# Patient Record
Sex: Male | Born: 1935 | Race: White | Hispanic: No | Smoking: Never smoker
Health system: Southern US, Community
[De-identification: ages and names within clinical notes are randomized; demographics above are authoritative.]

## PROBLEM LIST (undated history)

## (undated) DIAGNOSIS — E7800 Pure hypercholesterolemia, unspecified: Secondary | ICD-10-CM

## (undated) DIAGNOSIS — I2510 Atherosclerotic heart disease of native coronary artery without angina pectoris: Secondary | ICD-10-CM

## (undated) DIAGNOSIS — J189 Pneumonia, unspecified organism: Secondary | ICD-10-CM

## (undated) DIAGNOSIS — M109 Gout, unspecified: Secondary | ICD-10-CM

## (undated) DIAGNOSIS — K219 Gastro-esophageal reflux disease without esophagitis: Secondary | ICD-10-CM

## (undated) DIAGNOSIS — I10 Essential (primary) hypertension: Secondary | ICD-10-CM

## (undated) HISTORY — PX: COLON SURGERY: SHX602

## (undated) HISTORY — PX: LITHOTRIPSY: SUR834

## (undated) HISTORY — PX: ACHILLES TENDON REPAIR: SUR1153

## (undated) HISTORY — PX: CAROTID ARTERY ANGIOPLASTY: SHX1300

## (undated) HISTORY — PX: LEG SURGERY: SHX1003

---

## 2004-05-31 ENCOUNTER — Ambulatory Visit: Payer: Self-pay | Admitting: Internal Medicine

## 2004-05-31 IMAGING — CT CT STONE STUDY
1 of 2 series · 14 of 32 positions shown, 18 images · non-contrast
Comparison: none

REASON FOR EXAM: LT side pain   hx of stones   ATTN KUB
COMMENTS:

[Series 2: soft tissue · axial · 0.67mm/px · z∈[-566,-146]mm · 14 of 158 slices shown, 18 images]
[im 12/158  soft-tissue]
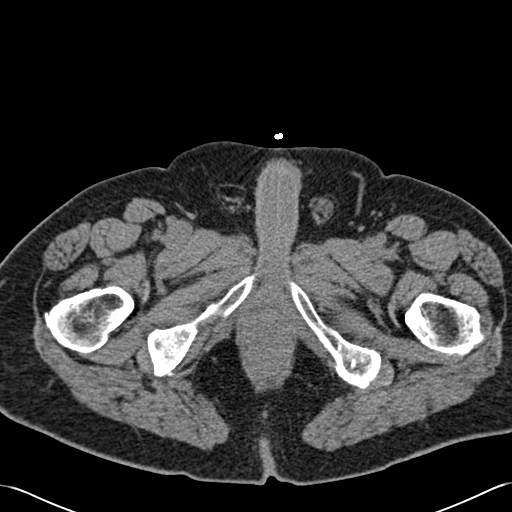
[im 12/158  bone]
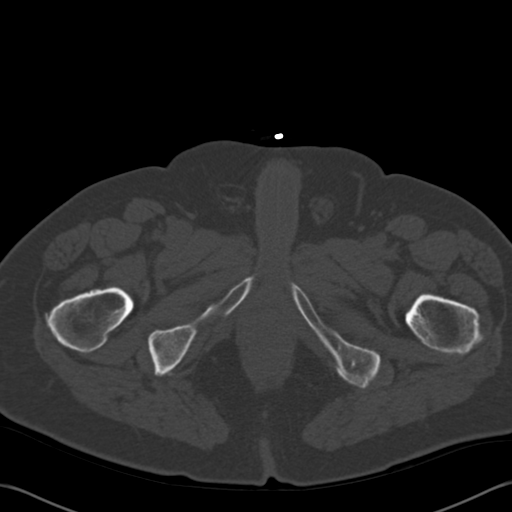
[im 23/158  soft-tissue]
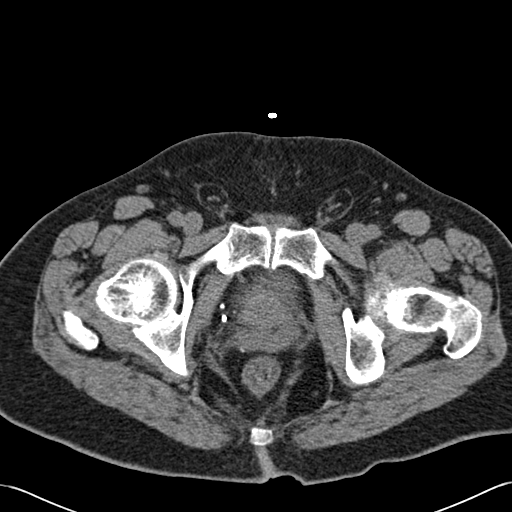
[im 34/158  soft-tissue]
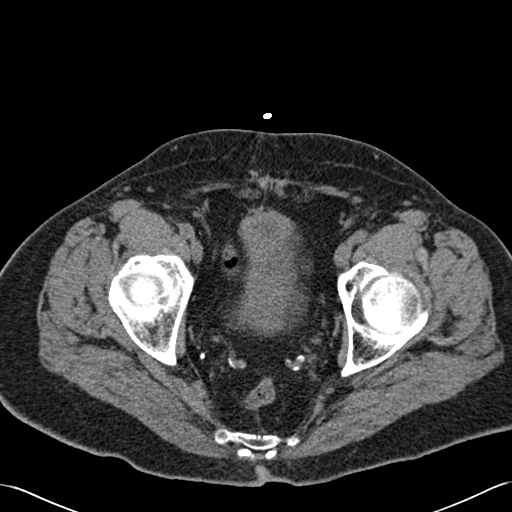
[im 45/158  soft-tissue]
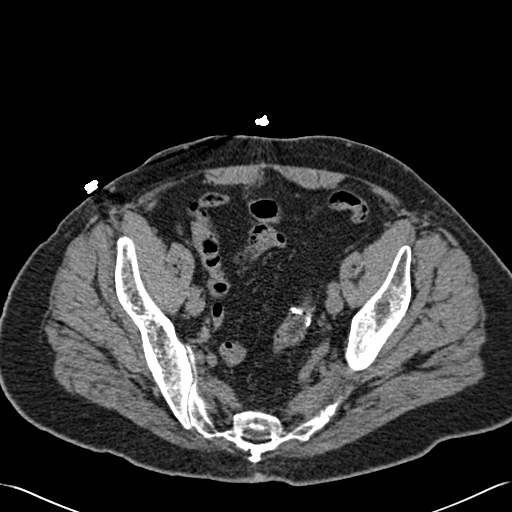
[im 62/158  soft-tissue]
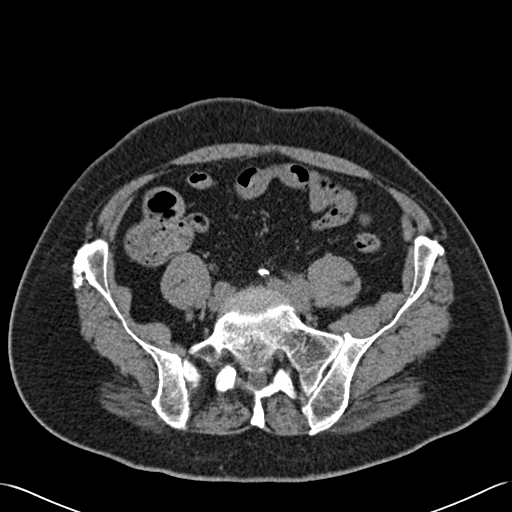
[im 73/158  soft-tissue]
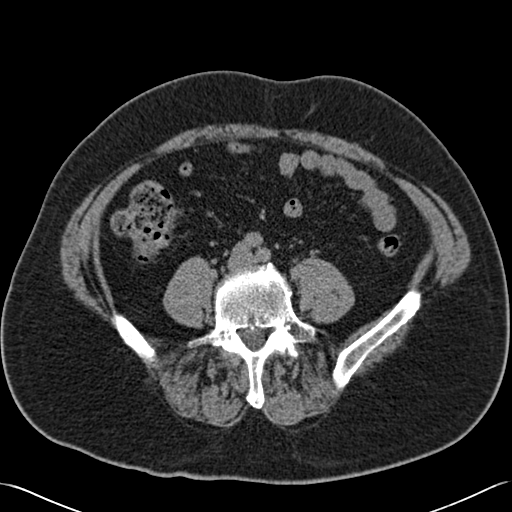
[im 85/158  soft-tissue]
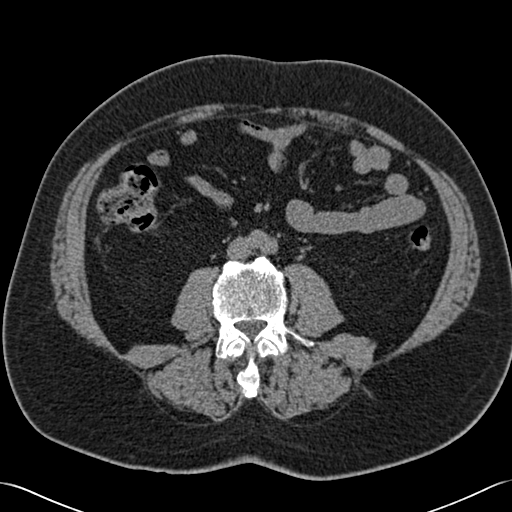
[im 96/158  soft-tissue]
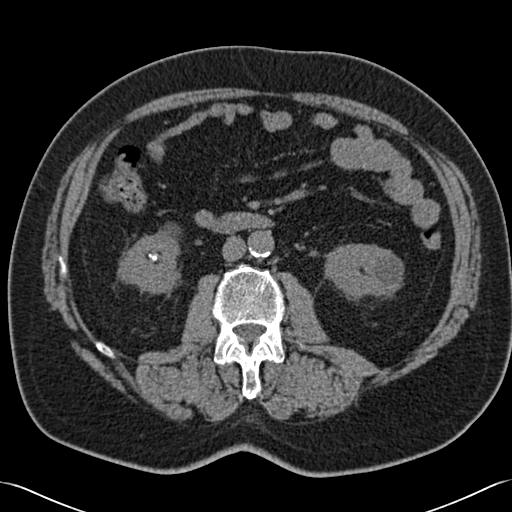
[im 113/158  soft-tissue]
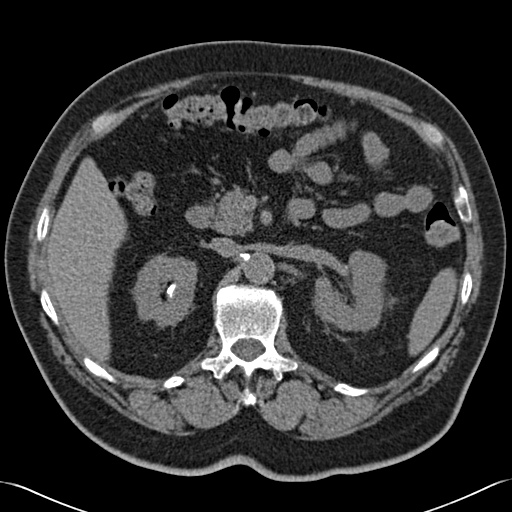
[im 113/158  bone]
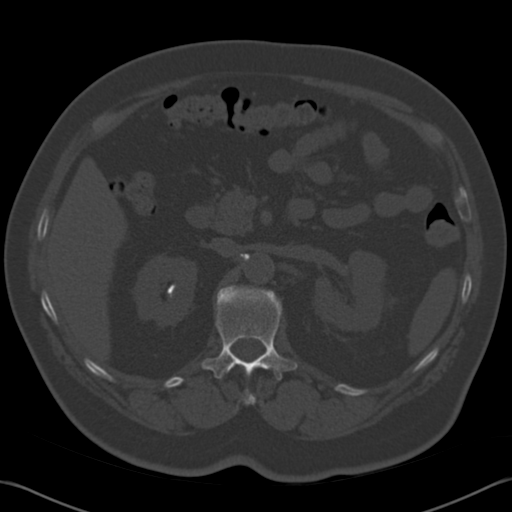
[im 124/158  soft-tissue]
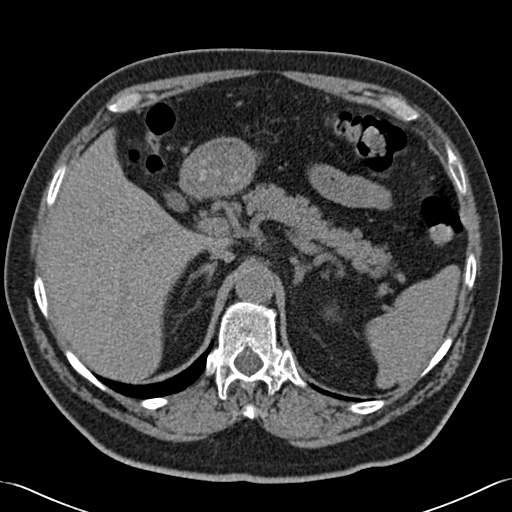
[im 135/158  soft-tissue]
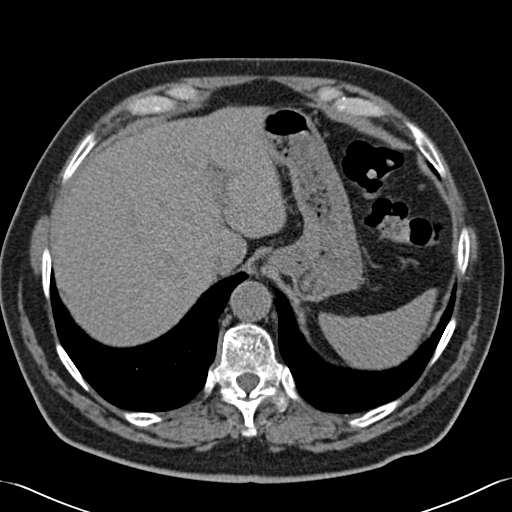
[im 135/158  lung]
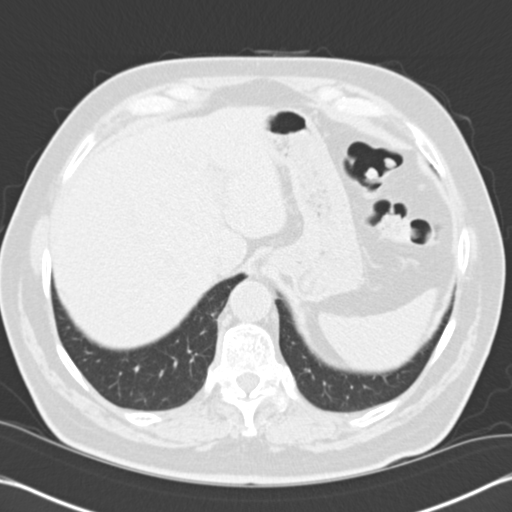
[im 141/158  lung]
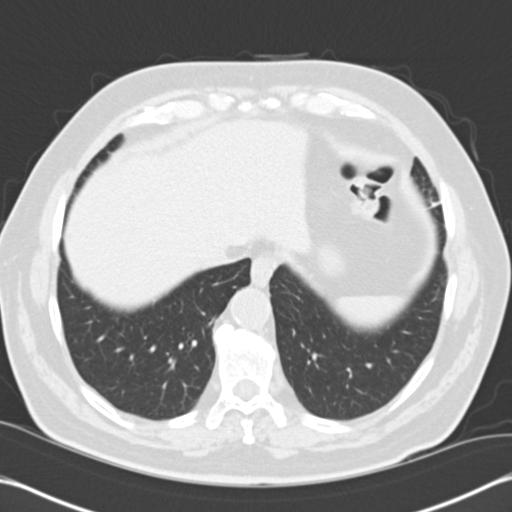
[im 146/158  soft-tissue]
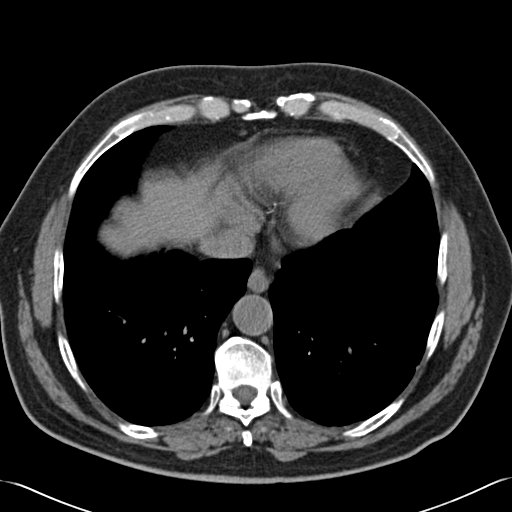
[im 146/158  lung]
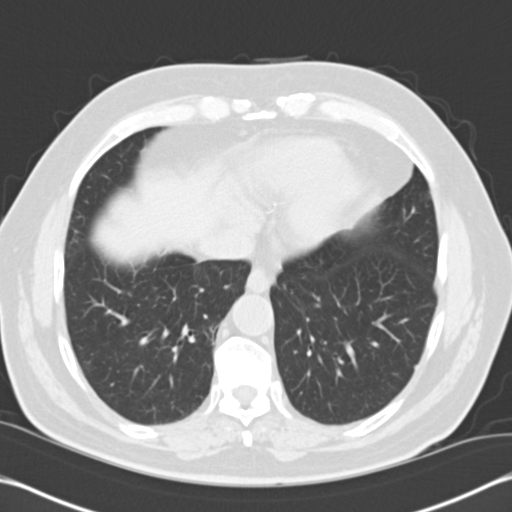
[im 152/158  lung]
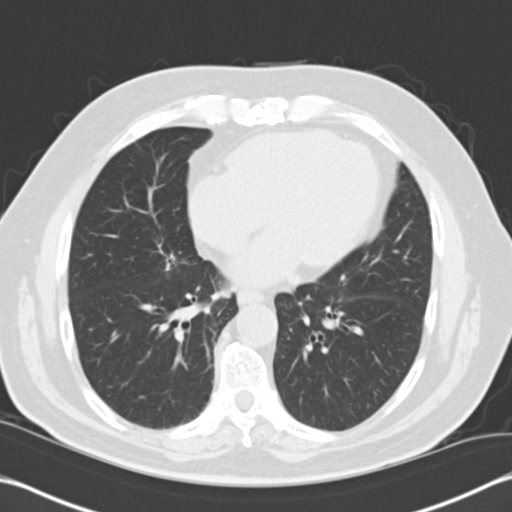

[14 of 32 positions shown; findings below may reference images not displayed]

PROCEDURE:     CT  - CT ABDOMEN /PELVIS WO (STONE)  - [DATE]  [DATE]

RESULT:     3 mm unenhanced cuts through the abdomen and pelvis were
performed.  The lung bases are clear. No pleural or pericardial effusions
are present. Cuts through the upper abdomen show the solid organs aside from
the kidneys to be free of abnormality.  Their evaluation is somewhat limited
due to lack of IV contrast.  Both kidneys show multiple calcifications in
the corticomedullary junctions measuring between 2 and 4 mm.  These do not
appear to be causing obstruction, as there is no evidence of hydronephrosis,
hydroureter or perinephric stranding.  There is subtle contour abnormality
in the mid portion of the LEFT kidney and in the lower pole of the RIGHT
kidney.  Hounsfield units for both measure in the teens that suggests fluid
density, but I would recommend further evaluation with ultrasound or
triphasic study of the abdomen to exclude an underlying sinister process.  A
prior ultrasound of [DATE] showed a LEFT renal cyst, but no other renal
lesions so I would again recommend further evaluation. No free
intraperitoneal fluid, air or adenopathy is identified. The bony structures
show extensive degenerative change without lytic or blastic bony lesion.
There is evidence of prior pelvic surgery.  The bladder distends normally
without evidence of filling defect or wall thickening. There are two
calcific densities the LEFT ureterovesical junction and may have recently
passed into the bladder.  They are likely only causing only partial
obstruction as no significant hydronephrosis or hydroureter is identified.
The prostate gland is mildly enlarged. There are calcified seminal vesicles.
 No perirectal mass or thickening is identified. No inguinal mass or
adenopathy is noted.
IMPRESSION: 1)Two separate calcific densities are seen at the LEFT ureterovesical
junction. They may have recently passed into the bladder.  Each of these
measures approximately 3 mm in size.  If they are causing obstruction it is
only likely partial obstruction as there is no evidence of hydronephrosis,
hydroureter or perinephric stranding on the LEFT side.

2)Contour abnormalities are noted in both kidneys.  A previous renal
ultrasound showed only a simple LEFT renal cyst.  Without contrast I cannot
evaluate these contour abnormalities adequately and would recommend either
another ultrasound or triphasic CT for further evaluation.

3)Bilateral non-obstructing nephrolithiasis is seen in the corticomedullary
junction of both kidneys with stones measuring between 2 and 4 mm in size.

4)No free intraperitoneal fluid, air or adenopathy.

5)The remaining solid organs of the abdomen are unremarkable.

6)Extensive degenerative bony changes without lytic or blastic bony lesion.

## 2004-06-07 ENCOUNTER — Ambulatory Visit: Payer: Self-pay | Admitting: Urology

## 2004-06-07 IMAGING — US US RENAL KIDNEY
1 series · 17 of 25 positions shown · non-contrast
Comparison: none

REASON FOR EXAM: Abnormal CT showing contour abnormalities in both kidneys,
bilateral kidney stones
COMMENTS:

[Series 1: us renal kidney · 17 of 59 slices shown]
[im 1/59]
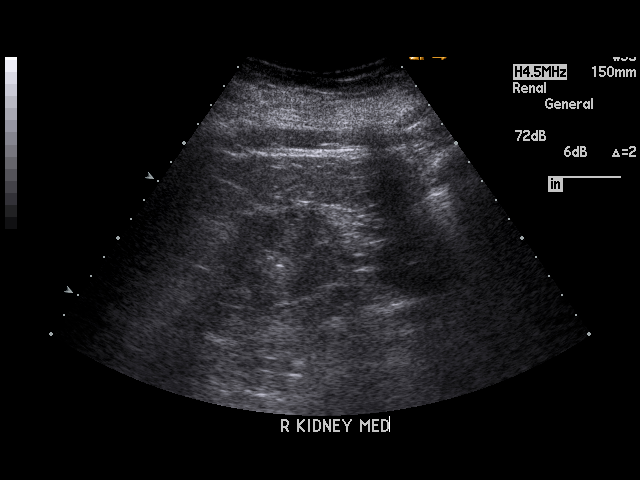
[im 5/59]
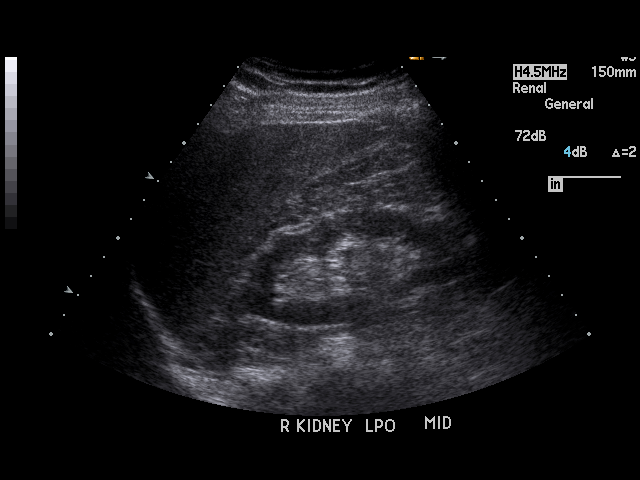
[im 8/59]
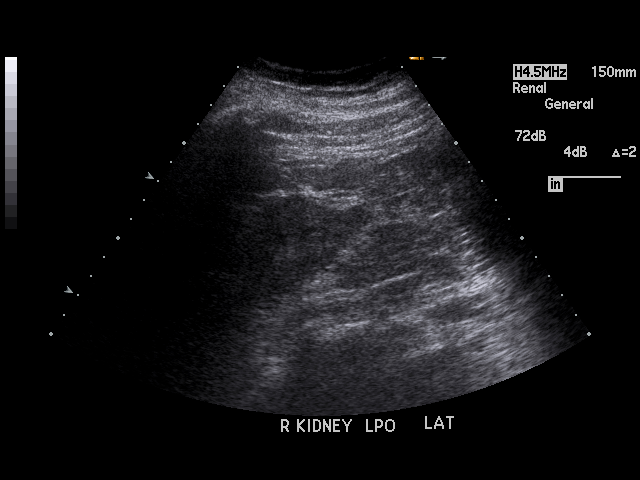
[im 13/59]
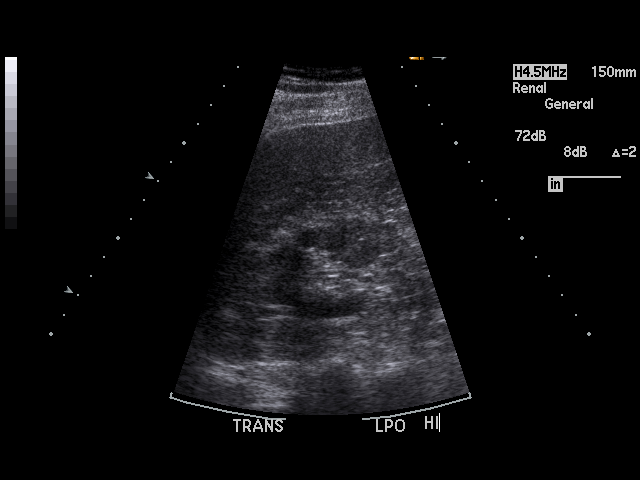
[im 15/59]
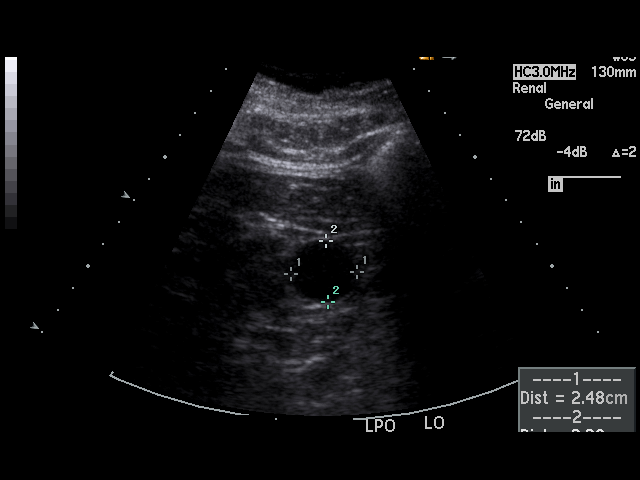
[im 20/59]
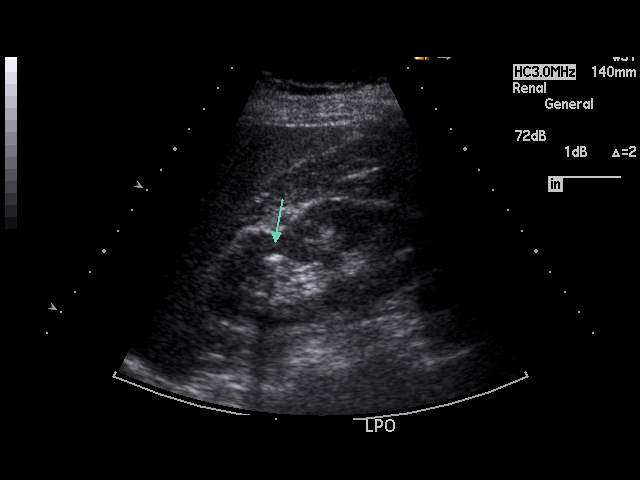
[im 22/59]
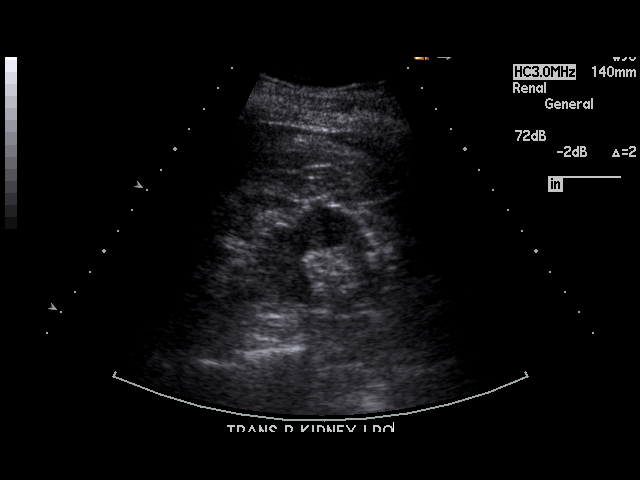
[im 27/59]
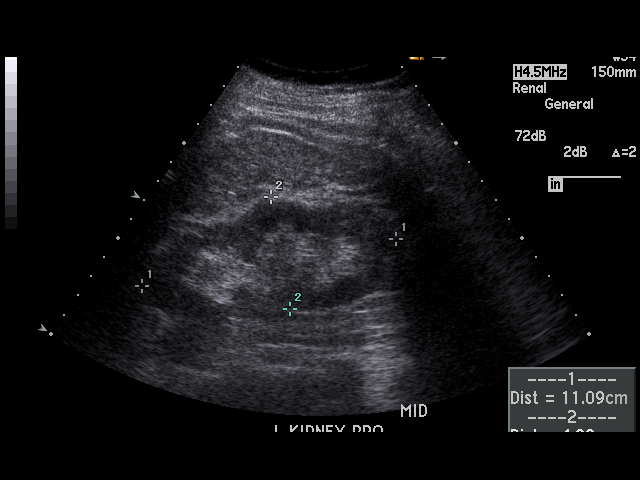
[im 30/59]
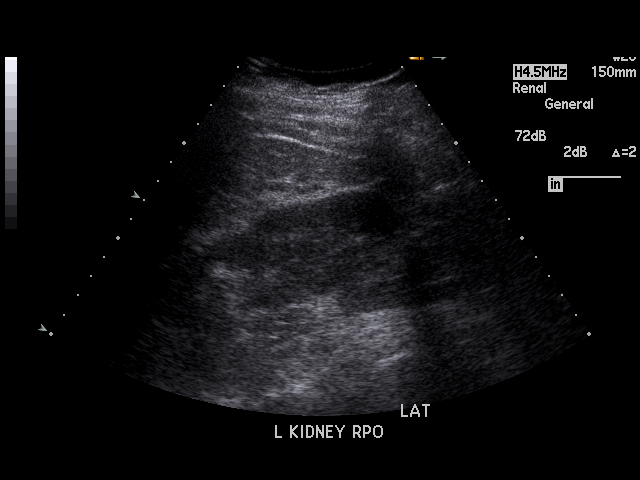
[im 32/59]
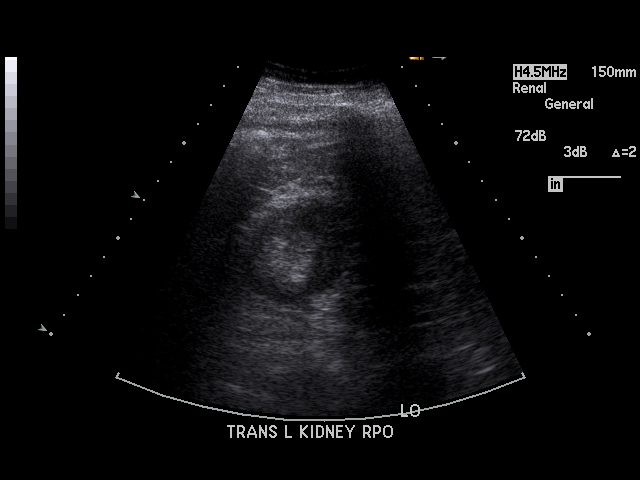
[im 37/59]
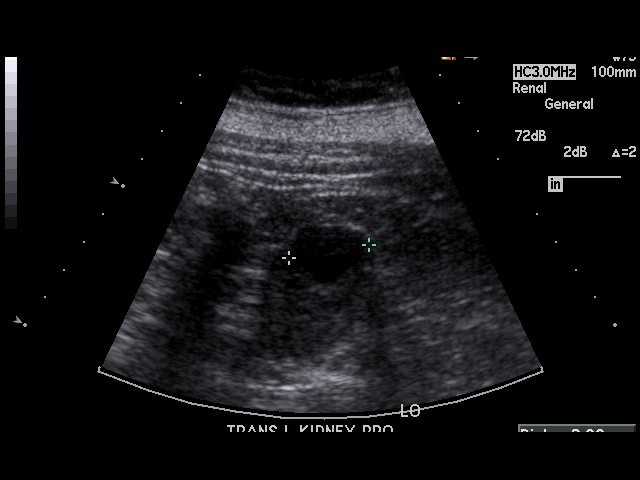
[im 39/59]
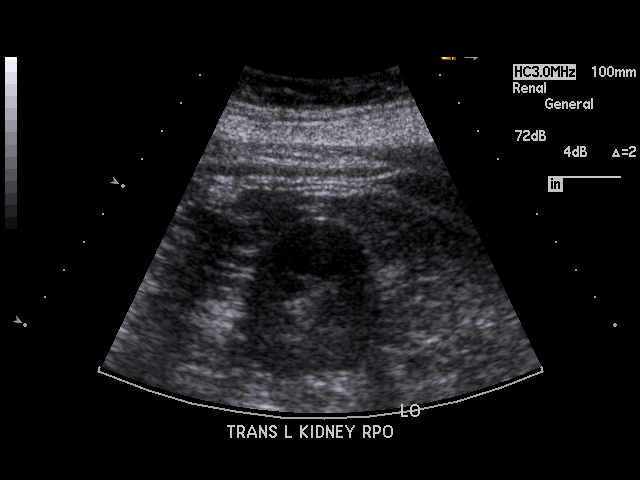
[im 44/59]
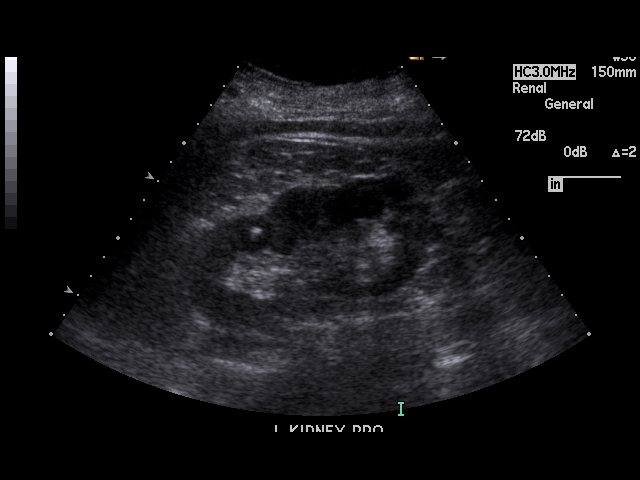
[im 46/59]
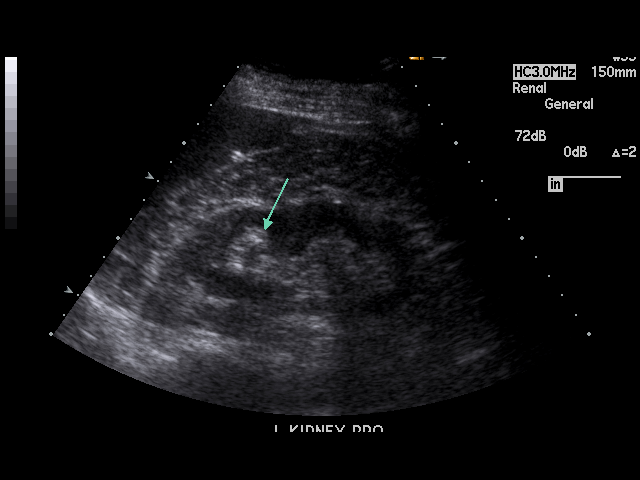
[im 51/59]
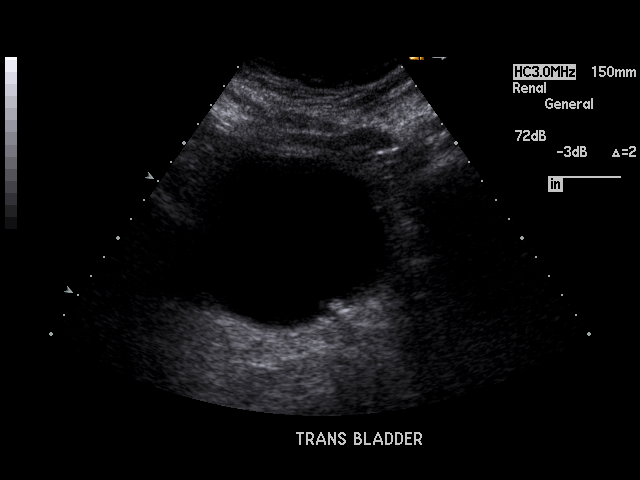
[im 54/59]
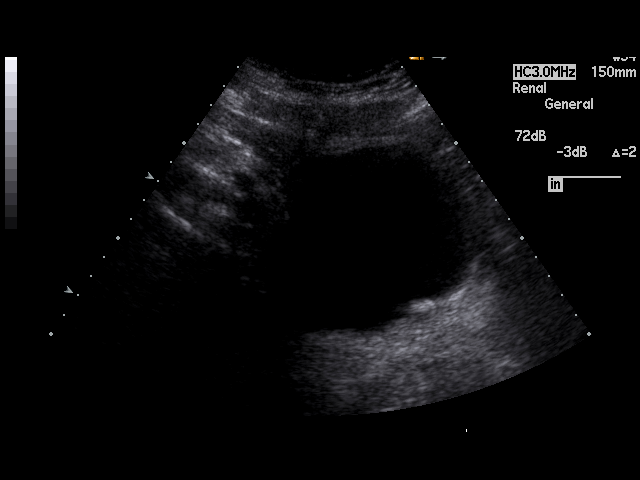
[im 59/59]
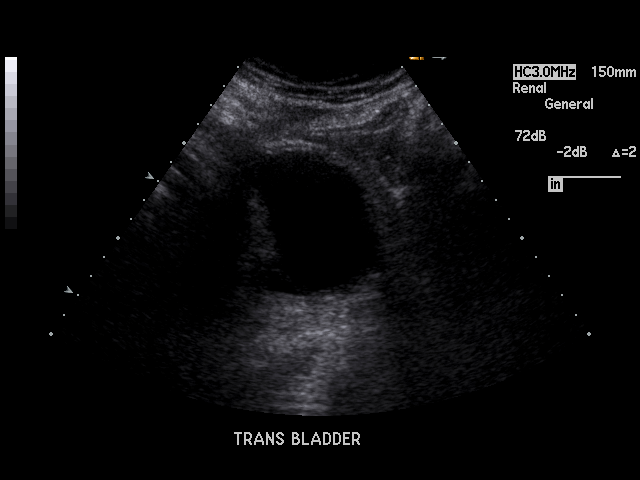

[17 of 25 positions shown; findings below may reference images not displayed]

PROCEDURE:     US  - US KIDNEY BILATERAL  - [DATE]  [DATE]

RESULT:     Ultrasound evaluation of the kidneys shows them to measure
cm in length. Both kidneys show a lobular contour and cortical thinning. The
cortical thinning is likely age consistent. The lobular contour suggests
persistent fetal lobation. No suspicious mass is identified on either
kidney. There is no hydronephrosis. Small corticomedullary junction
calcifications are noted measuring between 2.0 and 3.0 mm in both kidneys
but there is no obvious obstruction. There is a simple cyst of the lower
pole of the LEFT kidney measuring 2.4 x 1.7 cm. The bladder distends
normally but shows evidence of a small 2.0 to 3.0 mm calcification at the
LEFT UVJ. Because there is no hydronephrosis or hydroureter identified this
likely is only causing partial obstruction.
IMPRESSION: 1.     Bilateral non-obstructing nephrolithiasis.
2.     There is a 2.0 to 3.0 mm stone at the LEFT UVJ likely only causing
partial obstruction as there is no hydronephrosis or hydroureter.
3.     No suspicious focal lesion is identified with either kidney. There is
a lobular contour to both kidneys likely representing persistent fetal
lobation.
4.     Simple cyst, lower pole of LEFT kidney.
5.     Age consistent cortical thinning.

## 2005-01-18 ENCOUNTER — Ambulatory Visit: Payer: Self-pay | Admitting: Unknown Physician Specialty

## 2007-01-16 ENCOUNTER — Ambulatory Visit: Payer: Self-pay | Admitting: Urology

## 2007-01-16 IMAGING — CR DG ABDOMEN 1V
1 series · 2 of 2 positions shown · non-contrast
Comparison: none

REASON FOR EXAM: renal calculi-lithotripsy
COMMENTS:

[Series 1: view not recorded · 0.17mm/px · 2 of 2 slices shown]
[im 1/2]
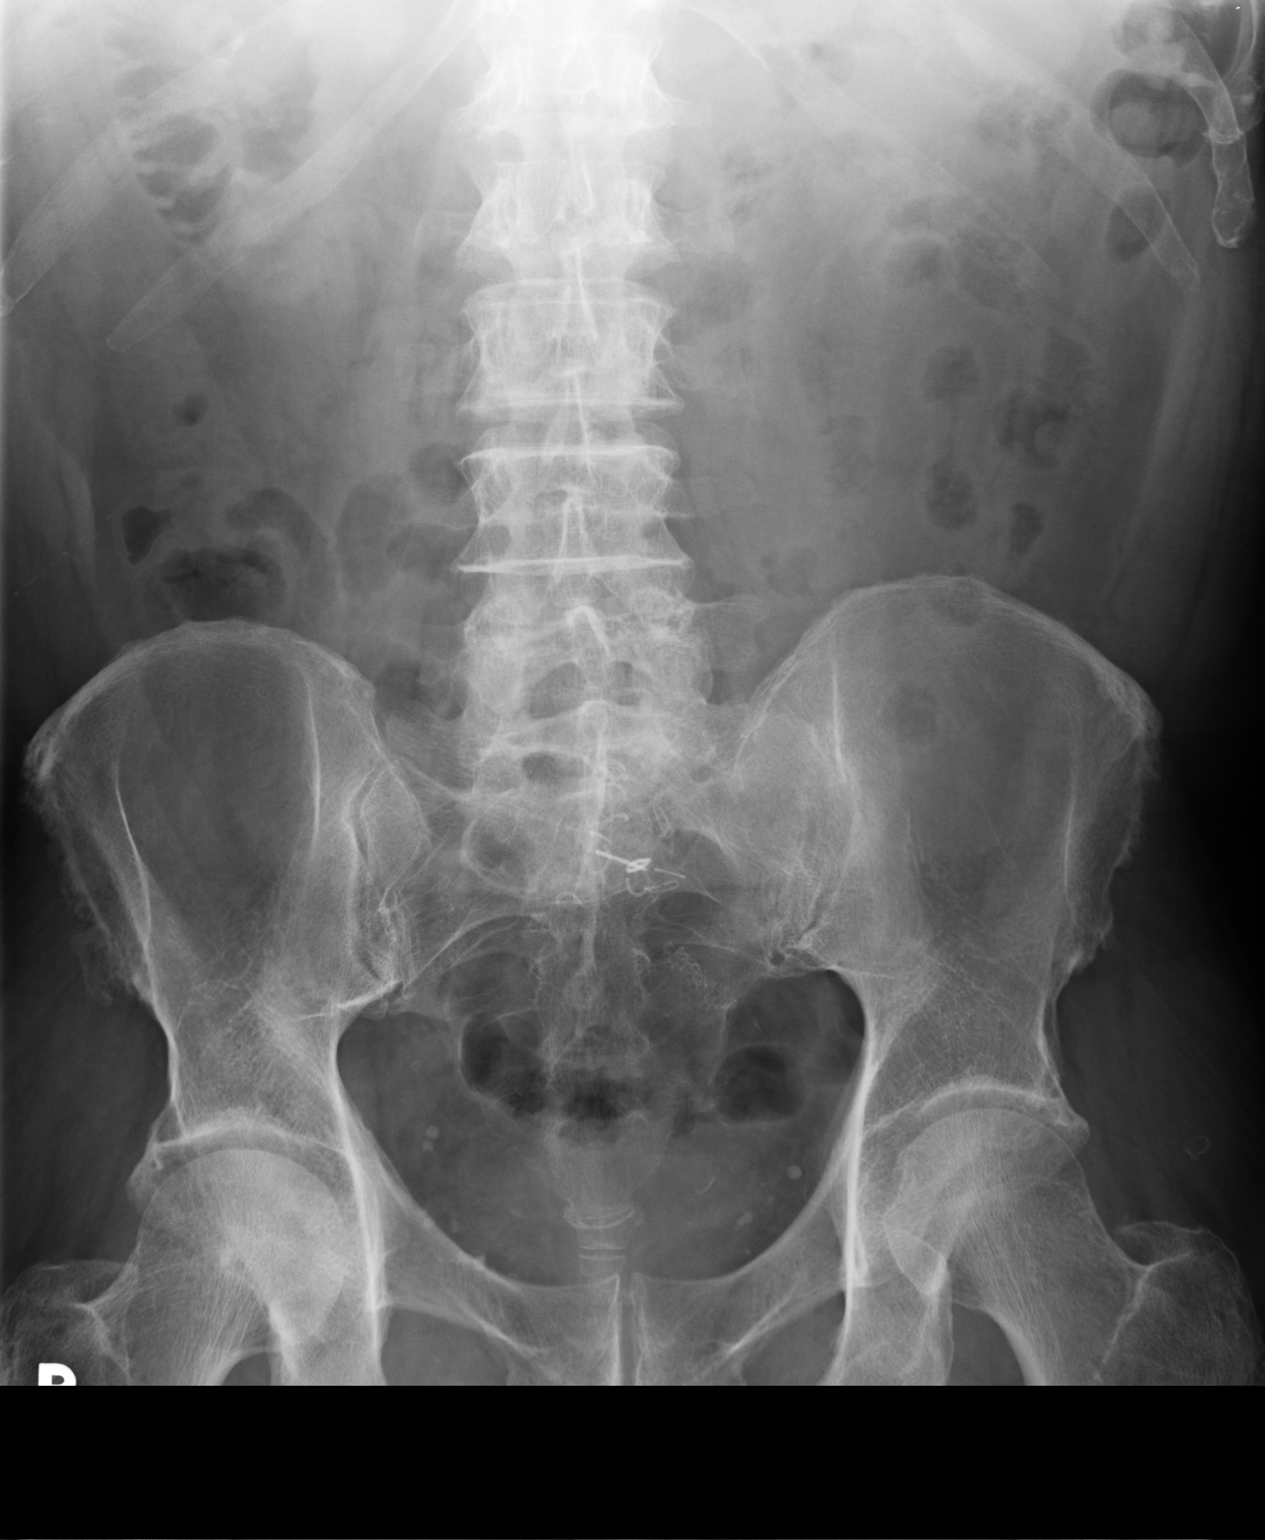
[im 2/2]
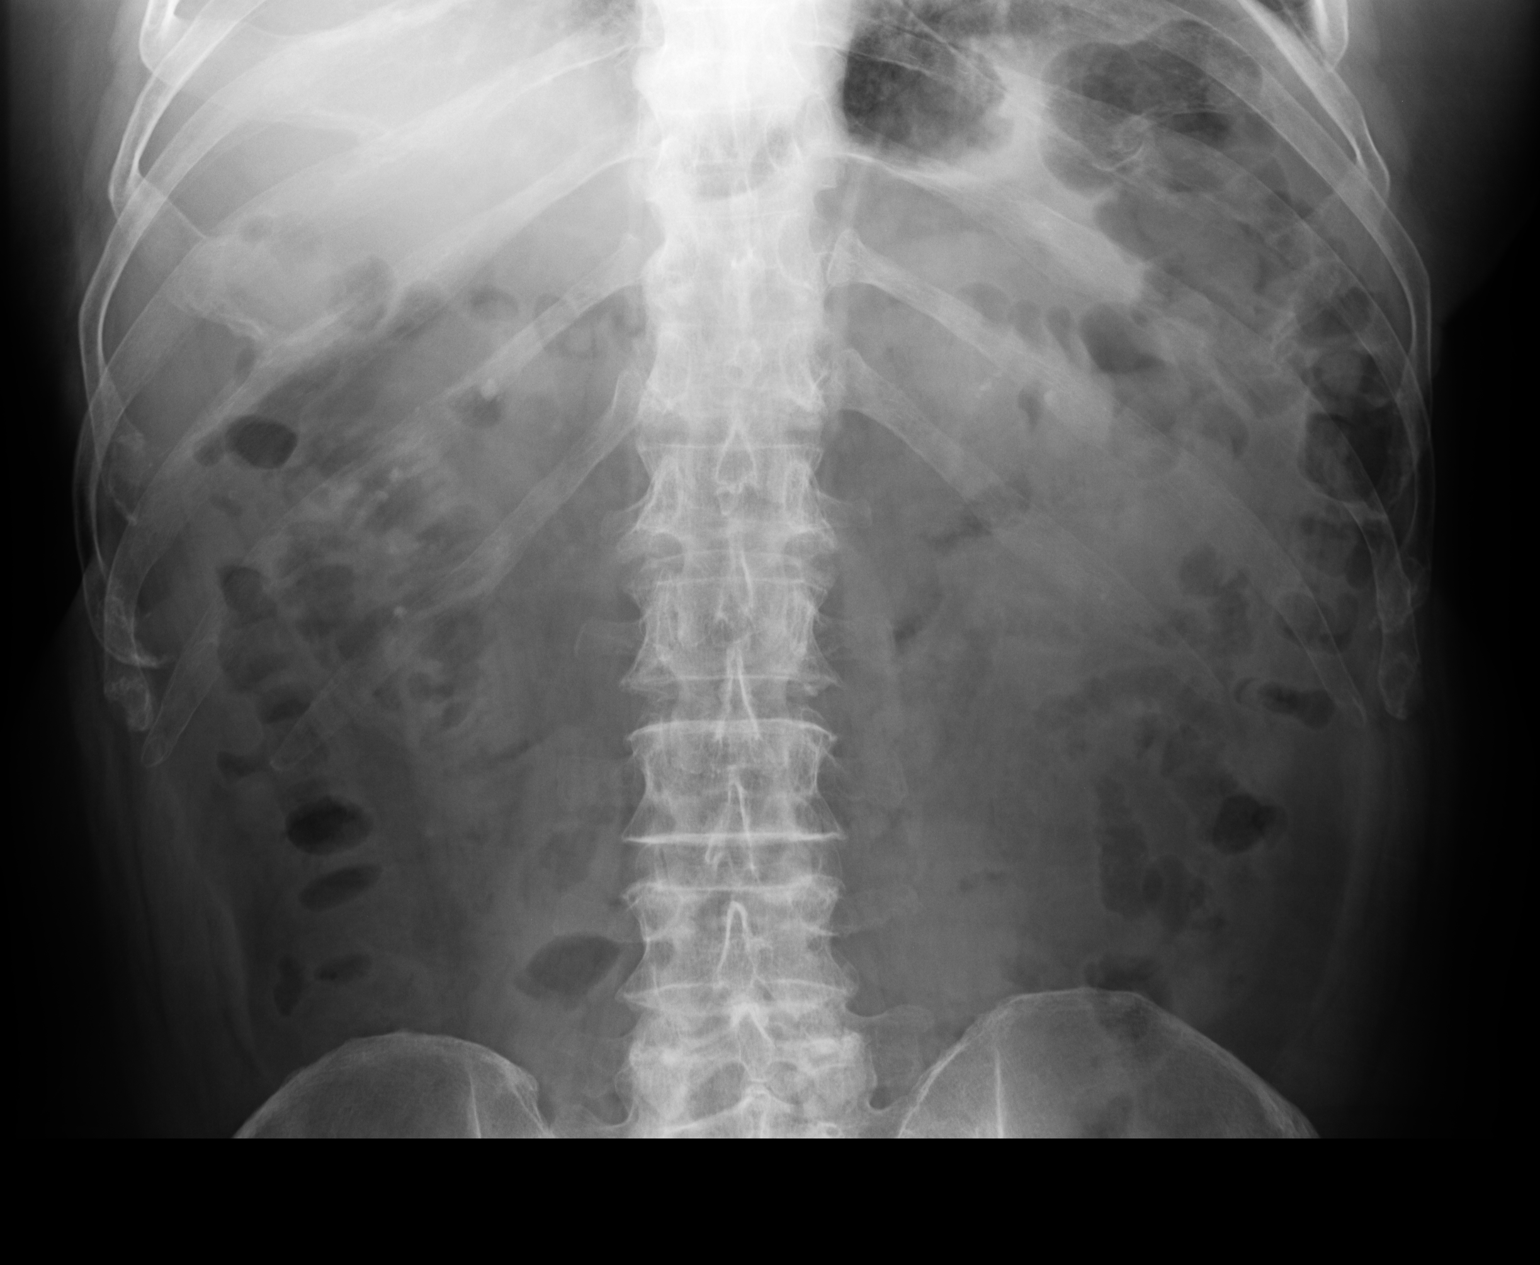

[2 of 2 positions shown; findings below may reference images not displayed]

PROCEDURE:     DXR - DXR KIDNEY URETER BLADDER  - [DATE]  [DATE]

RESULT:     The bowel gas pattern is within the limits of normal. There are
phleboliths within the pelvis. There are at least five stones visible
involving the RIGHT kidney. The largest is in the upper pole and measures
approximately 6 mm in diameter. There mild degenerative changes of the
lumbar spine. There is a calcification measuring approximately 3 mm that
projects over the midupper pole of the LEFT kidney.
IMPRESSION: There are bilateral kidney stones as described. Further interpretation is
deferred to Dr. JEURY.

## 2007-02-27 ENCOUNTER — Ambulatory Visit: Payer: Self-pay | Admitting: Urology

## 2007-02-27 IMAGING — CR DG ABDOMEN 1V
1 series · 1 of 1 positions shown · non-contrast
Comparison: none

REASON FOR EXAM: renal calculi-lithotripsy
COMMENTS:

[view not recorded]
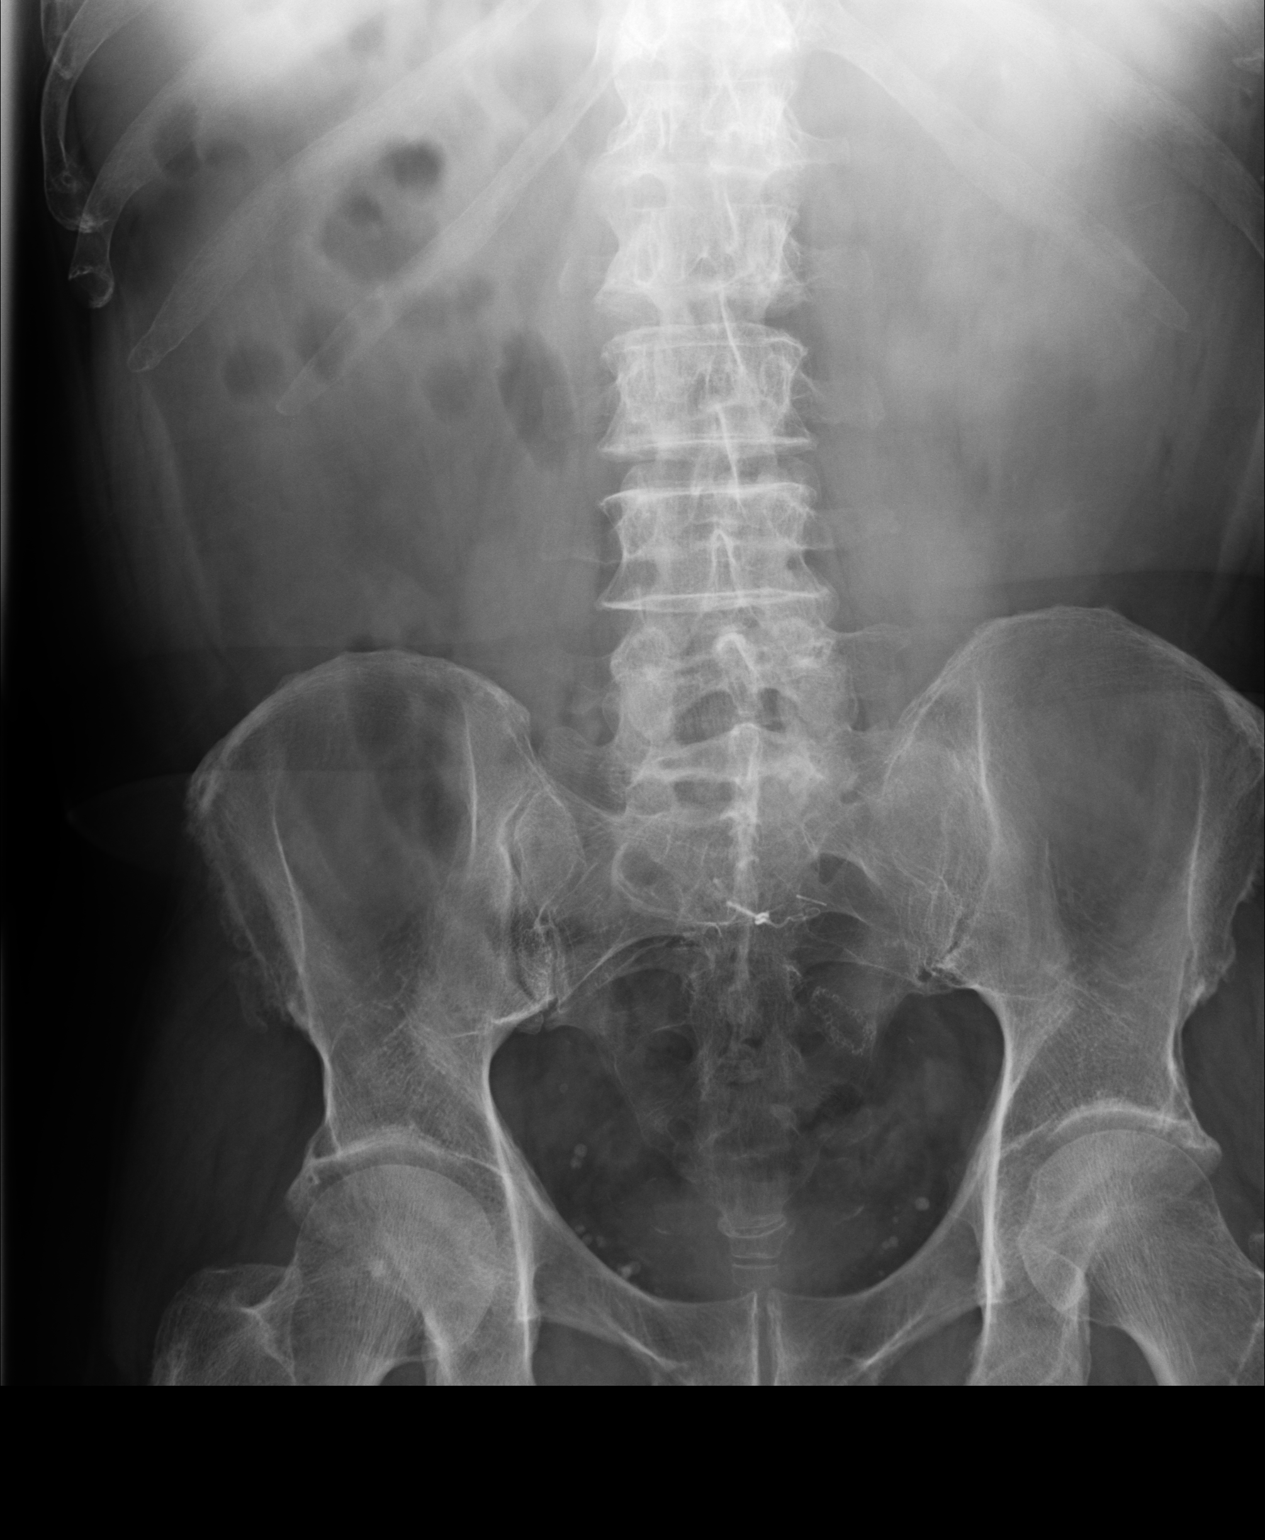

[1 of 1 positions shown; findings below may reference images not displayed]

PROCEDURE:     DXR - DXR KIDNEY URETER BLADDER  - [DATE]  [DATE]

RESULT:     The bowel gas pattern is within the limits of normal. There are
calcifications within the pelvis most compatible with phleboliths. I do not
see definite calcifications projecting over the renal fossa on the LEFT or
RIGHT. There is a linear metallic radiodensity projecting over the sacrum.
IMPRESSION: There are calcifications within the pelvis most compatible with phleboliths
and vascular arterial calcifications. I do not see definite evidence of
calcified stones. There is a very faint calcification that projects just
lateral to the lateral tip of the L4 transverse process on the LEFT. This
may reflect a tiny stone but it is poorly defined.

## 2009-04-28 ENCOUNTER — Ambulatory Visit: Payer: Self-pay | Admitting: Unknown Physician Specialty

## 2012-07-16 ENCOUNTER — Ambulatory Visit: Payer: Self-pay | Admitting: Vascular Surgery

## 2012-07-16 IMAGING — CT CT ANGIOGRAPHY NECK
1 of 4 series · 12 of 33 positions shown · IV contrast (APPLIED)
Comparison: none

REASON FOR EXAM: LABS 1ST Carotid Stenosis
COMMENTS:

PROCEDURE:     CT  - CT ANGIOGRAPHY NECK W/CONTRAST  - [DATE] [DATE]
RESULT:
TECHNIQUE: Helical axial 3 mm source imaging was obtained status post
intravenous administration of 100 mL of [E2]. Coronal and sagittal
maximum intensity projections were obtained as well as 3-D VRT
reconstructions. This study was also evaluated utilizing [employer] vessel-view
software reconstruction by the performing physician.

[Series 5: soft tissue · axial · 0.46mm/px · z∈[-227,-14]mm · 12 of 85 slices shown]
[im 7/85  soft-tissue]
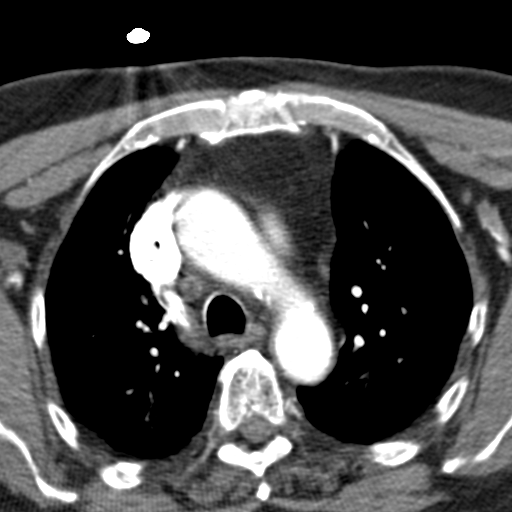
[im 13/85  bone]
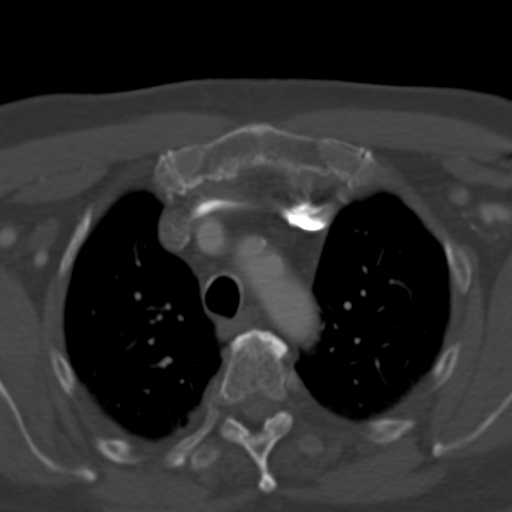
[im 20/85  soft-tissue]
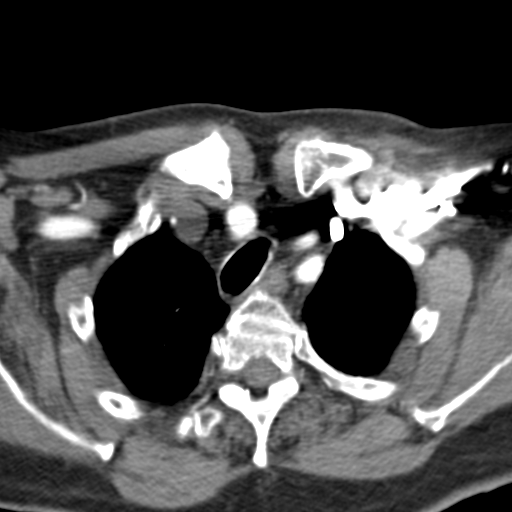
[im 26/85  bone]
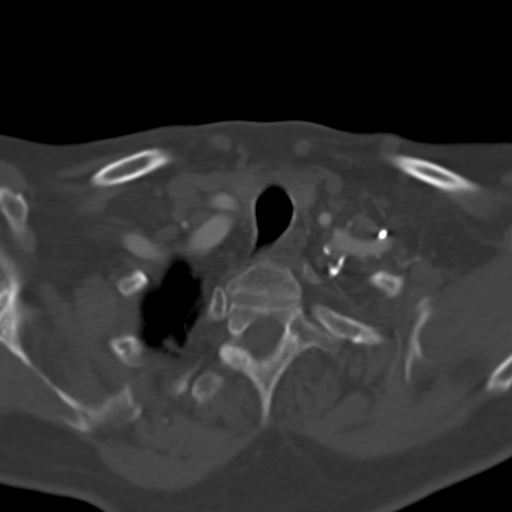
[im 33/85  soft-tissue]
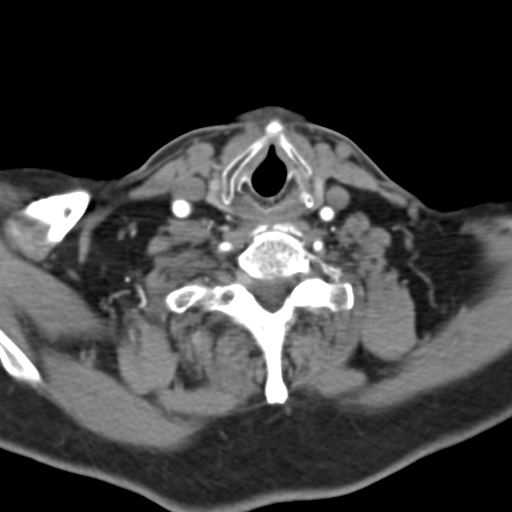
[im 39/85  bone]
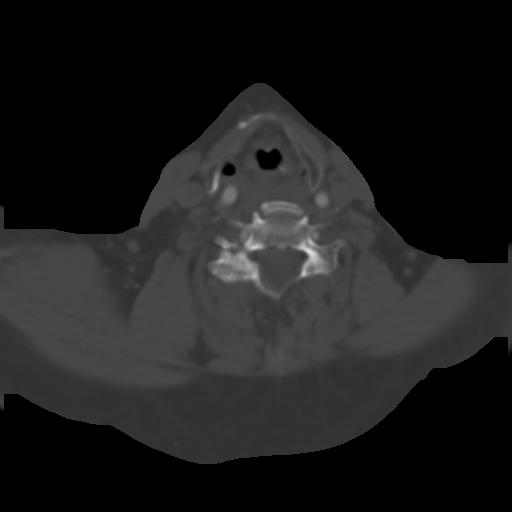
[im 46/85  soft-tissue]
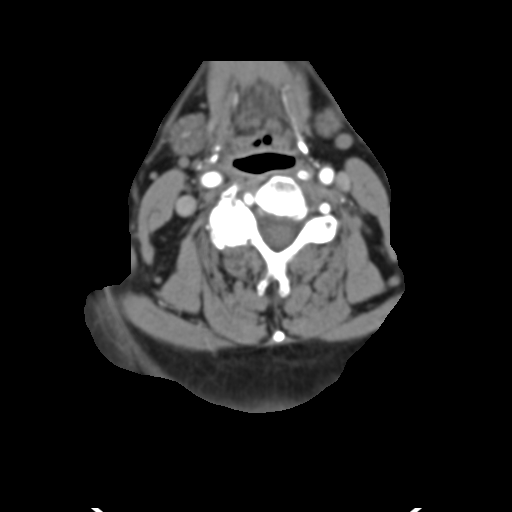
[im 52/85  bone]
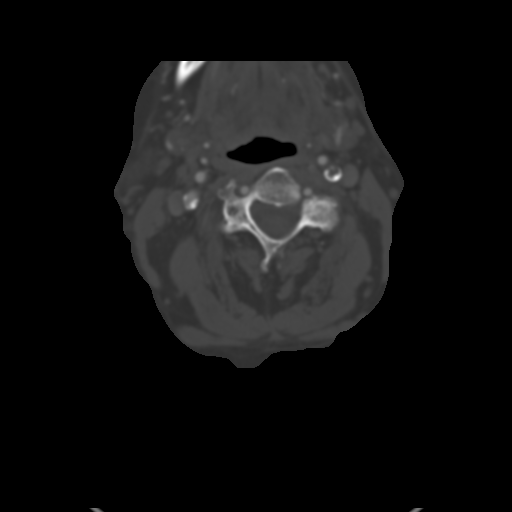
[im 59/85  soft-tissue]
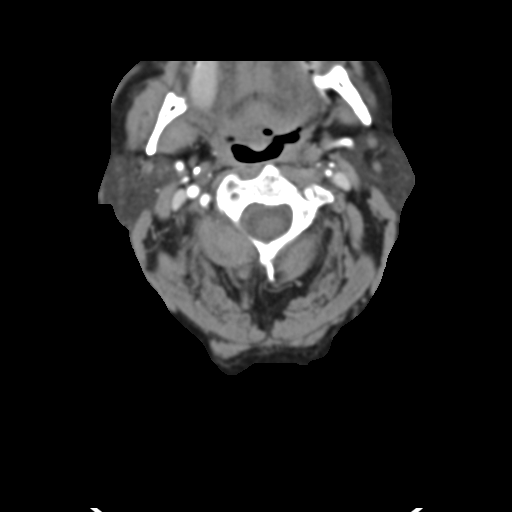
[im 65/85  bone]
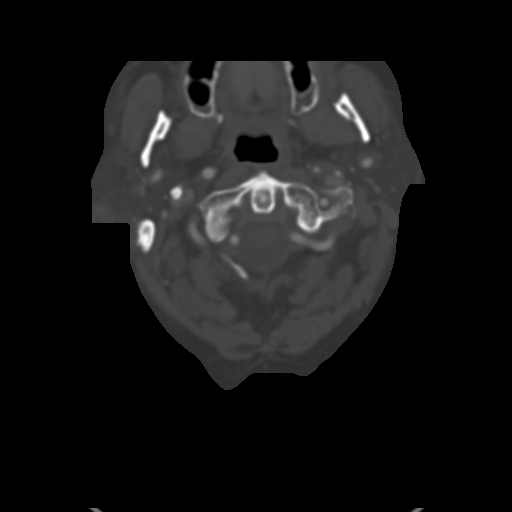
[im 72/85  soft-tissue]
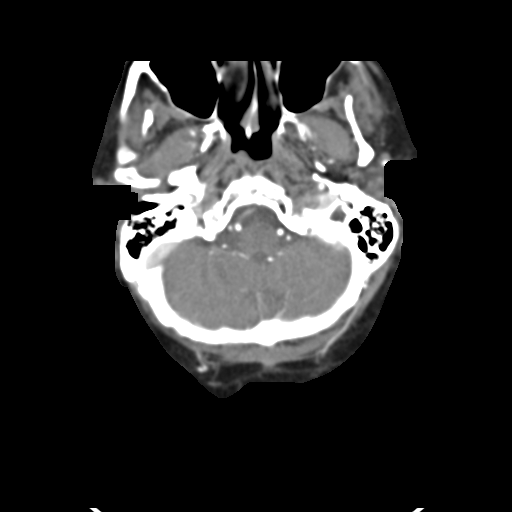
[im 78/85  bone]
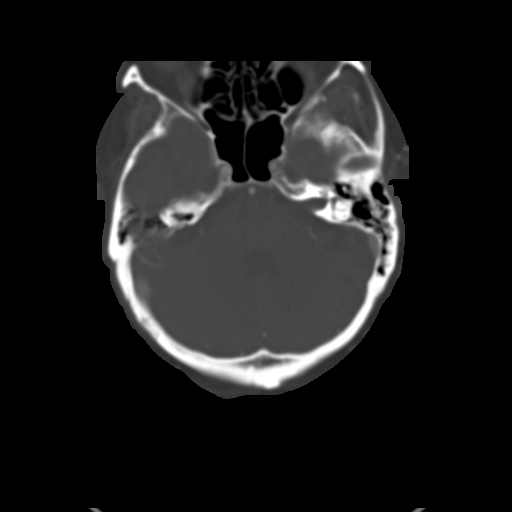

[12 of 33 positions shown; findings below may reference images not displayed]

FINDINGS: Evaluation of the right carotid system demonstrates coarse mural
within the carotid bulb extending into the proximal portion of the internal
carotid artery. There are areas of moderate to high-grade stenosis within
the distal aspect of the carotid bulb and high-grade stenosis at the origin
extending to the proximal portion of the internal carotid artery. The region
within the carotid bulb is concerning for 65% to 70% stenosis and within the
magnitude of 70% to 75%. The remaining visualized portions of the right
carotid system demonstrate no further evidence of stenosis.

Evaluation of the left system demonstrates coarse calcifications extending
from the distal aspect of the common carotid artery into the carotid bulb
and proximal internal carotid artery. There are findings consistent with
high-grade stenosis within the carotid bulb which appears to be on the
magnitude of 80% to 85%. This extends into the origin of the internal
carotid artery. There is diffuse severe narrowing of the internal carotid
artery on the left. No focal regions beyond the area at the origin stenosis
are identified.

The remaining components of the left carotid system appear unremarkable.

Limited evaluation of the circle of Willis and vertebrobasilar system is
unremarkable.
IMPRESSION: 1. Areas concerning for bilateral hemodynamically significant stenosis
within the right and left carotid systems. On the right, these findings
involve the carotid bulb extending into the proximal aspect of the internal
carotid artery. On the left, the findings involve the carotid bulb with
extension into the internal carotid artery. There is diffuse moderate to
severe narrowing of the internal carotid artery on the left which may
represent diffuse atherosclerotic disease versus diffuse hypoplasia.

## 2012-08-27 ENCOUNTER — Ambulatory Visit: Payer: Self-pay | Admitting: Vascular Surgery

## 2012-08-27 LAB — BASIC METABOLIC PANEL
Anion Gap: 3 — ABNORMAL LOW (ref 7–16)
Chloride: 107 mmol/L (ref 98–107)
Co2: 29 mmol/L (ref 21–32)
EGFR (African American): 42 — ABNORMAL LOW
EGFR (Non-African Amer.): 36 — ABNORMAL LOW
Glucose: 118 mg/dL — ABNORMAL HIGH (ref 65–99)
Sodium: 139 mmol/L (ref 136–145)

## 2012-08-27 LAB — CBC
MCH: 32.4 pg (ref 26.0–34.0)
MCHC: 34.2 g/dL (ref 32.0–36.0)
MCV: 95 fL (ref 80–100)
RBC: 4.33 10*6/uL — ABNORMAL LOW (ref 4.40–5.90)
RDW: 12.4 % (ref 11.5–14.5)

## 2012-09-03 ENCOUNTER — Inpatient Hospital Stay: Payer: Self-pay | Admitting: Vascular Surgery

## 2012-09-04 LAB — CBC WITH DIFFERENTIAL/PLATELET
Basophil #: 0 10*3/uL (ref 0.0–0.1)
Basophil %: 0.3 %
Eosinophil #: 0 10*3/uL (ref 0.0–0.7)
HGB: 11.8 g/dL — ABNORMAL LOW (ref 13.0–18.0)
MCH: 32.9 pg (ref 26.0–34.0)
MCHC: 34.7 g/dL (ref 32.0–36.0)
MCV: 95 fL (ref 80–100)
Monocyte %: 11.1 %
Neutrophil #: 6.8 10*3/uL — ABNORMAL HIGH (ref 1.4–6.5)
Neutrophil %: 76.2 %
Platelet: 106 10*3/uL — ABNORMAL LOW (ref 150–440)
RDW: 12.7 % (ref 11.5–14.5)
WBC: 9 10*3/uL (ref 3.8–10.6)

## 2012-09-04 LAB — BASIC METABOLIC PANEL
Anion Gap: 5 — ABNORMAL LOW (ref 7–16)
BUN: 22 mg/dL — ABNORMAL HIGH (ref 7–18)
Calcium, Total: 8.1 mg/dL — ABNORMAL LOW (ref 8.5–10.1)
Chloride: 108 mmol/L — ABNORMAL HIGH (ref 98–107)
Co2: 25 mmol/L (ref 21–32)
Creatinine: 1.44 mg/dL — ABNORMAL HIGH (ref 0.60–1.30)
EGFR (African American): 52 — ABNORMAL LOW
Glucose: 104 mg/dL — ABNORMAL HIGH (ref 65–99)
Potassium: 4.3 mmol/L (ref 3.5–5.1)
Sodium: 138 mmol/L (ref 136–145)

## 2012-09-04 LAB — PATHOLOGY REPORT

## 2012-09-04 LAB — APTT: Activated PTT: 29.5 secs (ref 23.6–35.9)

## 2012-09-04 LAB — PROTIME-INR: Prothrombin Time: 14.1 secs (ref 11.5–14.7)

## 2014-06-04 NOTE — Op Note (Signed)
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   PROCEDURE PERFORMED:  1.  Left carotid endarterectomy with CorMatrix patch angioplasty.  2.  Repair of arterial defect with xenograft CorMatrix patch.   SURGEON: Katha Cabal, M.D.   ANESTHESIA: General by endotracheal intubation.   FLUIDS: Per anesthesia record.   ESTIMATED BLOOD LOSS: 100 mL.   SPECIMEN: Carotid plaque to pathology for permanent section.   INDICATIONS: Aaron Burton is an 81 year old gentleman, who was found to have critical stenosis of the left internal carotid artery. He has undergone appropriate preoperative evaluation and cardiac clearance. He is, therefore, undergoing left carotid endarterectomy to prevent stroke. The risks, benefits as well as alternative therapies were reviewed. All questions answered. The patient has agreed to proceed.   DESCRIPTION OF PROCEDURE: The patient is taken to the operating room and placed in the supine position. After adequate general anesthesia is induced and appropriate invasive monitors are placed, he is positioned supine with his neck rotated to the right and extended slightly. Appropriate timeout is called after he is prepped and draped in a sterile fashion.   A curvilinear incision is then made along the anterior margin of the sternocleidomastoid muscle on the left neck and the dissection is carried down through the soft tissues, transecting the platysma muscle. The external jugular vein is ligated between 2-0 silk ties. The sternocleidomastoid muscle is then identified and reflected posterolaterally. At the level of the omohyoid muscle, the common carotid is identified. It is then  dissected along its anterior margin up to the bifurcation. Superior thyroidal artery is looped with a blue Silastic vessel loop. The external carotid artery is looped with a red Silastic vessel loop. Dissection is then carried along the internal carotid artery to a level above the visible plaque formation. Both vagus and hypoglossals is where superior thyroidal nerves are identified and left undisturbed.   Heparin 7000 units is given and allowed to circulate for 5 minutes. The common carotid followed by the external, followed by the internal carotid artery is clamped. Arteriotomy is made, extended with Potts scissors and an indwelling shunt is placed without difficulty. Flow was re-established to the brain. Endarterectomy is then performed under direct visualization in the distal common bulb and internal carotid artery. External carotid artery is treated with the eversion technique. 7-0 interrupted Prolene sutures are used to tack the distal intimal edge within the internal carotid artery, 6-0 Prolene suture is used to tack the proximal intimal edge within the common carotid artery, CorMatrix patch is rehydrated on the back table and then delivered onto the field. It is beveled to an appropriate shape and then the CorMatrix with xenograft patch is used to repair the arterial defect using running 6-0 Prolene in a 4 quadrant technique. Copious irrigation is used throughout the application of the patch. The shunt is then removed and the last few millimeters of the suture line is completed. Flow is then re-established first to the external carotid artery and then the internal carotid artery to prevent distal embolization.   The suture line is then inspected for hemostasis, which is achieved with interrupted 6-0 Prolene suture as needed. The bed of the wound and the surrounding tissues are all meticulously inspected after irrigation with sterile saline  and hemostasis is obtained with Bovie cautery. Surgicel was  placed in the bed of the wound posterior to the artery and subsequently the sternocleidomastoid muscle is reapproximated to its anatomic location. The platysma is closed with running 3-0 Vicryl. The skin is then closed with 4-0 Monocryl subcuticular and Dermabond is applied.   The patient tolerated the procedure well. He was awakened in the operating room, moving all extremities and obeying simple commands. He was taken to the recovery area in stable condition. ____________________________ Katha Cabal, MD ggs:aw D: 09/16/2012 12:34:56 ET T: 09/16/2012 13:10:40 ET JOB#: [id number]  cc: Katha Cabal, MD, <Dictator> Dianah Field. Mable Fill, MD [facility] ST[facility] RICK NESES Katha Cabal MD ELECTRONICALLY SIGNED 09/23/2012 8:48

## 2015-03-03 DIAGNOSIS — M4696 Unspecified inflammatory spondylopathy, lumbar region: Secondary | ICD-10-CM | POA: Diagnosis not present

## 2015-03-14 DIAGNOSIS — L570 Actinic keratosis: Secondary | ICD-10-CM | POA: Diagnosis not present

## 2015-03-14 DIAGNOSIS — L821 Other seborrheic keratosis: Secondary | ICD-10-CM | POA: Diagnosis not present

## 2015-03-14 DIAGNOSIS — D2262 Melanocytic nevi of left upper limb, including shoulder: Secondary | ICD-10-CM | POA: Diagnosis not present

## 2015-03-14 DIAGNOSIS — X32XXXA Exposure to sunlight, initial encounter: Secondary | ICD-10-CM | POA: Diagnosis not present

## 2015-03-14 DIAGNOSIS — Z85828 Personal history of other malignant neoplasm of skin: Secondary | ICD-10-CM | POA: Diagnosis not present

## 2015-03-14 DIAGNOSIS — D2271 Melanocytic nevi of right lower limb, including hip: Secondary | ICD-10-CM | POA: Diagnosis not present

## 2015-07-28 DIAGNOSIS — I10 Essential (primary) hypertension: Secondary | ICD-10-CM | POA: Diagnosis not present

## 2015-07-28 DIAGNOSIS — R7303 Prediabetes: Secondary | ICD-10-CM | POA: Diagnosis not present

## 2015-07-28 DIAGNOSIS — Z23 Encounter for immunization: Secondary | ICD-10-CM | POA: Diagnosis not present

## 2015-07-28 DIAGNOSIS — E7800 Pure hypercholesterolemia, unspecified: Secondary | ICD-10-CM | POA: Diagnosis not present

## 2015-07-28 DIAGNOSIS — Z0000 Encounter for general adult medical examination without abnormal findings: Secondary | ICD-10-CM | POA: Diagnosis not present

## 2015-07-28 DIAGNOSIS — N182 Chronic kidney disease, stage 2 (mild): Secondary | ICD-10-CM | POA: Diagnosis not present

## 2015-11-14 DIAGNOSIS — Z08 Encounter for follow-up examination after completed treatment for malignant neoplasm: Secondary | ICD-10-CM | POA: Diagnosis not present

## 2015-11-14 DIAGNOSIS — D225 Melanocytic nevi of trunk: Secondary | ICD-10-CM | POA: Diagnosis not present

## 2015-11-14 DIAGNOSIS — C4442 Squamous cell carcinoma of skin of scalp and neck: Secondary | ICD-10-CM | POA: Diagnosis not present

## 2015-11-14 DIAGNOSIS — D485 Neoplasm of uncertain behavior of skin: Secondary | ICD-10-CM | POA: Diagnosis not present

## 2015-11-14 DIAGNOSIS — Z85828 Personal history of other malignant neoplasm of skin: Secondary | ICD-10-CM | POA: Diagnosis not present

## 2015-11-14 DIAGNOSIS — L570 Actinic keratosis: Secondary | ICD-10-CM | POA: Diagnosis not present

## 2015-11-14 DIAGNOSIS — X32XXXA Exposure to sunlight, initial encounter: Secondary | ICD-10-CM | POA: Diagnosis not present

## 2016-01-17 DIAGNOSIS — L905 Scar conditions and fibrosis of skin: Secondary | ICD-10-CM | POA: Diagnosis not present

## 2016-01-17 DIAGNOSIS — C4492 Squamous cell carcinoma of skin, unspecified: Secondary | ICD-10-CM | POA: Diagnosis not present

## 2016-01-20 DIAGNOSIS — E7800 Pure hypercholesterolemia, unspecified: Secondary | ICD-10-CM | POA: Diagnosis not present

## 2016-01-20 DIAGNOSIS — I10 Essential (primary) hypertension: Secondary | ICD-10-CM | POA: Diagnosis not present

## 2016-01-27 DIAGNOSIS — E7800 Pure hypercholesterolemia, unspecified: Secondary | ICD-10-CM | POA: Diagnosis not present

## 2016-01-27 DIAGNOSIS — R7303 Prediabetes: Secondary | ICD-10-CM | POA: Diagnosis not present

## 2016-01-27 DIAGNOSIS — E875 Hyperkalemia: Secondary | ICD-10-CM | POA: Diagnosis not present

## 2016-01-27 DIAGNOSIS — N183 Chronic kidney disease, stage 3 (moderate): Secondary | ICD-10-CM | POA: Diagnosis not present

## 2016-01-27 DIAGNOSIS — I10 Essential (primary) hypertension: Secondary | ICD-10-CM | POA: Diagnosis not present

## 2016-01-30 ENCOUNTER — Encounter (INDEPENDENT_AMBULATORY_CARE_PROVIDER_SITE_OTHER): Payer: Self-pay

## 2016-01-30 ENCOUNTER — Ambulatory Visit (INDEPENDENT_AMBULATORY_CARE_PROVIDER_SITE_OTHER): Payer: Self-pay | Admitting: Vascular Surgery

## 2016-02-20 DIAGNOSIS — I129 Hypertensive chronic kidney disease with stage 1 through stage 4 chronic kidney disease, or unspecified chronic kidney disease: Secondary | ICD-10-CM | POA: Diagnosis not present

## 2016-02-20 DIAGNOSIS — E875 Hyperkalemia: Secondary | ICD-10-CM | POA: Diagnosis not present

## 2016-02-20 DIAGNOSIS — N179 Acute kidney failure, unspecified: Secondary | ICD-10-CM | POA: Diagnosis not present

## 2016-02-20 DIAGNOSIS — N183 Chronic kidney disease, stage 3 (moderate): Secondary | ICD-10-CM | POA: Diagnosis not present

## 2016-02-20 DIAGNOSIS — R319 Hematuria, unspecified: Secondary | ICD-10-CM | POA: Diagnosis not present

## 2016-03-06 DIAGNOSIS — N183 Chronic kidney disease, stage 3 (moderate): Secondary | ICD-10-CM | POA: Diagnosis not present

## 2016-03-28 ENCOUNTER — Other Ambulatory Visit (INDEPENDENT_AMBULATORY_CARE_PROVIDER_SITE_OTHER): Payer: PPO

## 2016-03-28 ENCOUNTER — Other Ambulatory Visit (INDEPENDENT_AMBULATORY_CARE_PROVIDER_SITE_OTHER): Payer: Self-pay | Admitting: Vascular Surgery

## 2016-03-28 ENCOUNTER — Ambulatory Visit (INDEPENDENT_AMBULATORY_CARE_PROVIDER_SITE_OTHER): Payer: PPO | Admitting: Vascular Surgery

## 2016-03-28 DIAGNOSIS — I739 Peripheral vascular disease, unspecified: Principal | ICD-10-CM

## 2016-03-28 DIAGNOSIS — I779 Disorder of arteries and arterioles, unspecified: Secondary | ICD-10-CM

## 2016-03-28 LAB — VAS US CAROTID
LCCADDIAS: 14 cm/s
LCCAPDIAS: 17 cm/s
LEFT ECA DIAS: -11 cm/s
LEFT VERTEBRAL DIAS: 23 cm/s
LICADDIAS: -18 cm/s
LICAPSYS: -78 cm/s
Left CCA dist sys: 91 cm/s
Left CCA prox sys: 101 cm/s
Left ICA dist sys: -76 cm/s
Left ICA prox dias: -17 cm/s
RCCAPSYS: 101 cm/s
RIGHT CCA MID DIAS: -14 cm/s
RIGHT ECA DIAS: -16 cm/s
RIGHT VERTEBRAL DIAS: 14 cm/s
Right CCA prox dias: 10 cm/s
Right cca dist sys: -100 cm/s

## 2016-03-29 DIAGNOSIS — E875 Hyperkalemia: Secondary | ICD-10-CM | POA: Diagnosis not present

## 2016-03-29 DIAGNOSIS — N2581 Secondary hyperparathyroidism of renal origin: Secondary | ICD-10-CM | POA: Diagnosis not present

## 2016-03-29 DIAGNOSIS — N183 Chronic kidney disease, stage 3 (moderate): Secondary | ICD-10-CM | POA: Diagnosis not present

## 2016-03-29 DIAGNOSIS — R809 Proteinuria, unspecified: Secondary | ICD-10-CM | POA: Diagnosis not present

## 2016-03-29 DIAGNOSIS — I129 Hypertensive chronic kidney disease with stage 1 through stage 4 chronic kidney disease, or unspecified chronic kidney disease: Secondary | ICD-10-CM | POA: Diagnosis not present

## 2016-04-16 DIAGNOSIS — L570 Actinic keratosis: Secondary | ICD-10-CM | POA: Diagnosis not present

## 2016-04-16 DIAGNOSIS — D044 Carcinoma in situ of skin of scalp and neck: Secondary | ICD-10-CM | POA: Diagnosis not present

## 2016-04-16 DIAGNOSIS — L905 Scar conditions and fibrosis of skin: Secondary | ICD-10-CM | POA: Diagnosis not present

## 2016-04-16 DIAGNOSIS — Z85828 Personal history of other malignant neoplasm of skin: Secondary | ICD-10-CM | POA: Diagnosis not present

## 2016-04-16 DIAGNOSIS — C44529 Squamous cell carcinoma of skin of other part of trunk: Secondary | ICD-10-CM | POA: Diagnosis not present

## 2016-04-16 DIAGNOSIS — X32XXXA Exposure to sunlight, initial encounter: Secondary | ICD-10-CM | POA: Diagnosis not present

## 2016-04-16 DIAGNOSIS — D485 Neoplasm of uncertain behavior of skin: Secondary | ICD-10-CM | POA: Diagnosis not present

## 2016-04-27 ENCOUNTER — Telehealth (INDEPENDENT_AMBULATORY_CARE_PROVIDER_SITE_OTHER): Payer: Self-pay | Admitting: Vascular Surgery

## 2016-04-27 DIAGNOSIS — R7303 Prediabetes: Secondary | ICD-10-CM | POA: Diagnosis not present

## 2016-04-27 DIAGNOSIS — E7800 Pure hypercholesterolemia, unspecified: Secondary | ICD-10-CM | POA: Diagnosis not present

## 2016-04-27 DIAGNOSIS — E785 Hyperlipidemia, unspecified: Secondary | ICD-10-CM | POA: Diagnosis not present

## 2016-04-27 DIAGNOSIS — I10 Essential (primary) hypertension: Secondary | ICD-10-CM | POA: Diagnosis not present

## 2016-04-27 NOTE — Telephone Encounter (Signed)
Left message to discuss carotid ultrasound results from 03/28/16.

## 2016-04-27 NOTE — Telephone Encounter (Signed)
Pt had US done 2.14.18 and haven't received his results. Please call pt.

## 2016-05-04 DIAGNOSIS — R7303 Prediabetes: Secondary | ICD-10-CM | POA: Diagnosis not present

## 2016-05-04 DIAGNOSIS — E7800 Pure hypercholesterolemia, unspecified: Secondary | ICD-10-CM | POA: Diagnosis not present

## 2016-05-04 DIAGNOSIS — N183 Chronic kidney disease, stage 3 (moderate): Secondary | ICD-10-CM | POA: Diagnosis not present

## 2016-05-04 DIAGNOSIS — I10 Essential (primary) hypertension: Secondary | ICD-10-CM | POA: Diagnosis not present

## 2016-05-28 DIAGNOSIS — C44529 Squamous cell carcinoma of skin of other part of trunk: Secondary | ICD-10-CM | POA: Diagnosis not present

## 2016-05-28 DIAGNOSIS — L905 Scar conditions and fibrosis of skin: Secondary | ICD-10-CM | POA: Diagnosis not present

## 2016-06-11 DIAGNOSIS — D044 Carcinoma in situ of skin of scalp and neck: Secondary | ICD-10-CM | POA: Diagnosis not present

## 2016-06-18 ENCOUNTER — Encounter: Payer: Self-pay | Admitting: Emergency Medicine

## 2016-06-18 ENCOUNTER — Emergency Department: Payer: PPO

## 2016-06-18 ENCOUNTER — Emergency Department
Admission: EM | Admit: 2016-06-18 | Discharge: 2016-06-18 | Disposition: A | Discharge status: Home / Self Care | Payer: PPO | Source: Home / Self Care | Attending: Emergency Medicine | Admitting: Emergency Medicine

## 2016-06-18 ENCOUNTER — Emergency Department
Admission: EM | Admit: 2016-06-18 | Discharge: 2016-06-18 | Disposition: A | Discharge status: Home / Self Care | Payer: PPO | Attending: Emergency Medicine | Admitting: Emergency Medicine

## 2016-06-18 DIAGNOSIS — Y92009 Unspecified place in unspecified non-institutional (private) residence as the place of occurrence of the external cause: Secondary | ICD-10-CM | POA: Insufficient documentation

## 2016-06-18 DIAGNOSIS — I10 Essential (primary) hypertension: Secondary | ICD-10-CM

## 2016-06-18 DIAGNOSIS — Y9389 Activity, other specified: Secondary | ICD-10-CM | POA: Insufficient documentation

## 2016-06-18 DIAGNOSIS — I2510 Atherosclerotic heart disease of native coronary artery without angina pectoris: Secondary | ICD-10-CM | POA: Insufficient documentation

## 2016-06-18 DIAGNOSIS — S32020A Wedge compression fracture of second lumbar vertebra, initial encounter for closed fracture: Secondary | ICD-10-CM | POA: Diagnosis not present

## 2016-06-18 DIAGNOSIS — W11XXXA Fall on and from ladder, initial encounter: Secondary | ICD-10-CM | POA: Insufficient documentation

## 2016-06-18 DIAGNOSIS — S32029A Unspecified fracture of second lumbar vertebra, initial encounter for closed fracture: Secondary | ICD-10-CM | POA: Insufficient documentation

## 2016-06-18 DIAGNOSIS — S3992XA Unspecified injury of lower back, initial encounter: Secondary | ICD-10-CM | POA: Diagnosis not present

## 2016-06-18 DIAGNOSIS — S32000A Wedge compression fracture of unspecified lumbar vertebra, initial encounter for closed fracture: Secondary | ICD-10-CM

## 2016-06-18 DIAGNOSIS — M549 Dorsalgia, unspecified: Secondary | ICD-10-CM | POA: Diagnosis not present

## 2016-06-18 DIAGNOSIS — W11XXXD Fall on and from ladder, subsequent encounter: Secondary | ICD-10-CM | POA: Insufficient documentation

## 2016-06-18 DIAGNOSIS — W19XXXA Unspecified fall, initial encounter: Secondary | ICD-10-CM

## 2016-06-18 DIAGNOSIS — S32020D Wedge compression fracture of second lumbar vertebra, subsequent encounter for fracture with routine healing: Secondary | ICD-10-CM | POA: Insufficient documentation

## 2016-06-18 DIAGNOSIS — Y999 Unspecified external cause status: Secondary | ICD-10-CM | POA: Diagnosis not present

## 2016-06-18 HISTORY — DX: Essential (primary) hypertension: I10

## 2016-06-18 HISTORY — DX: Atherosclerotic heart disease of native coronary artery without angina pectoris: I25.10

## 2016-06-18 IMAGING — CR DG LUMBAR SPINE 2-3V
3 series · 3 of 3 positions shown · non-contrast
Comparison: None.

CLINICAL DATA: Pain following fall

EXAM:
LUMBAR SPINE - 2-3 VIEW

[l-spine ap]
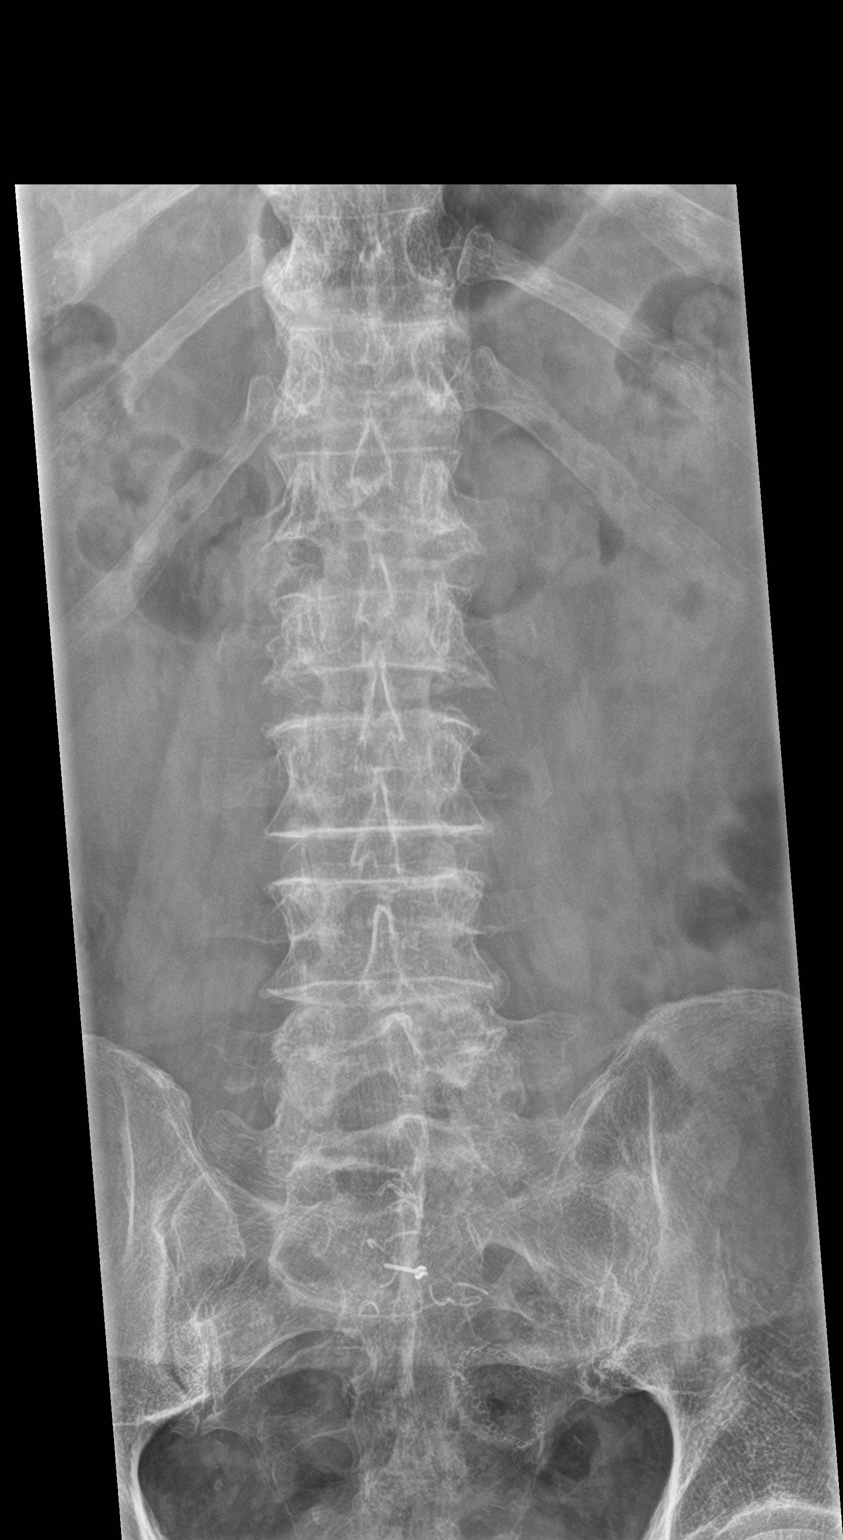

[l-spine lat]
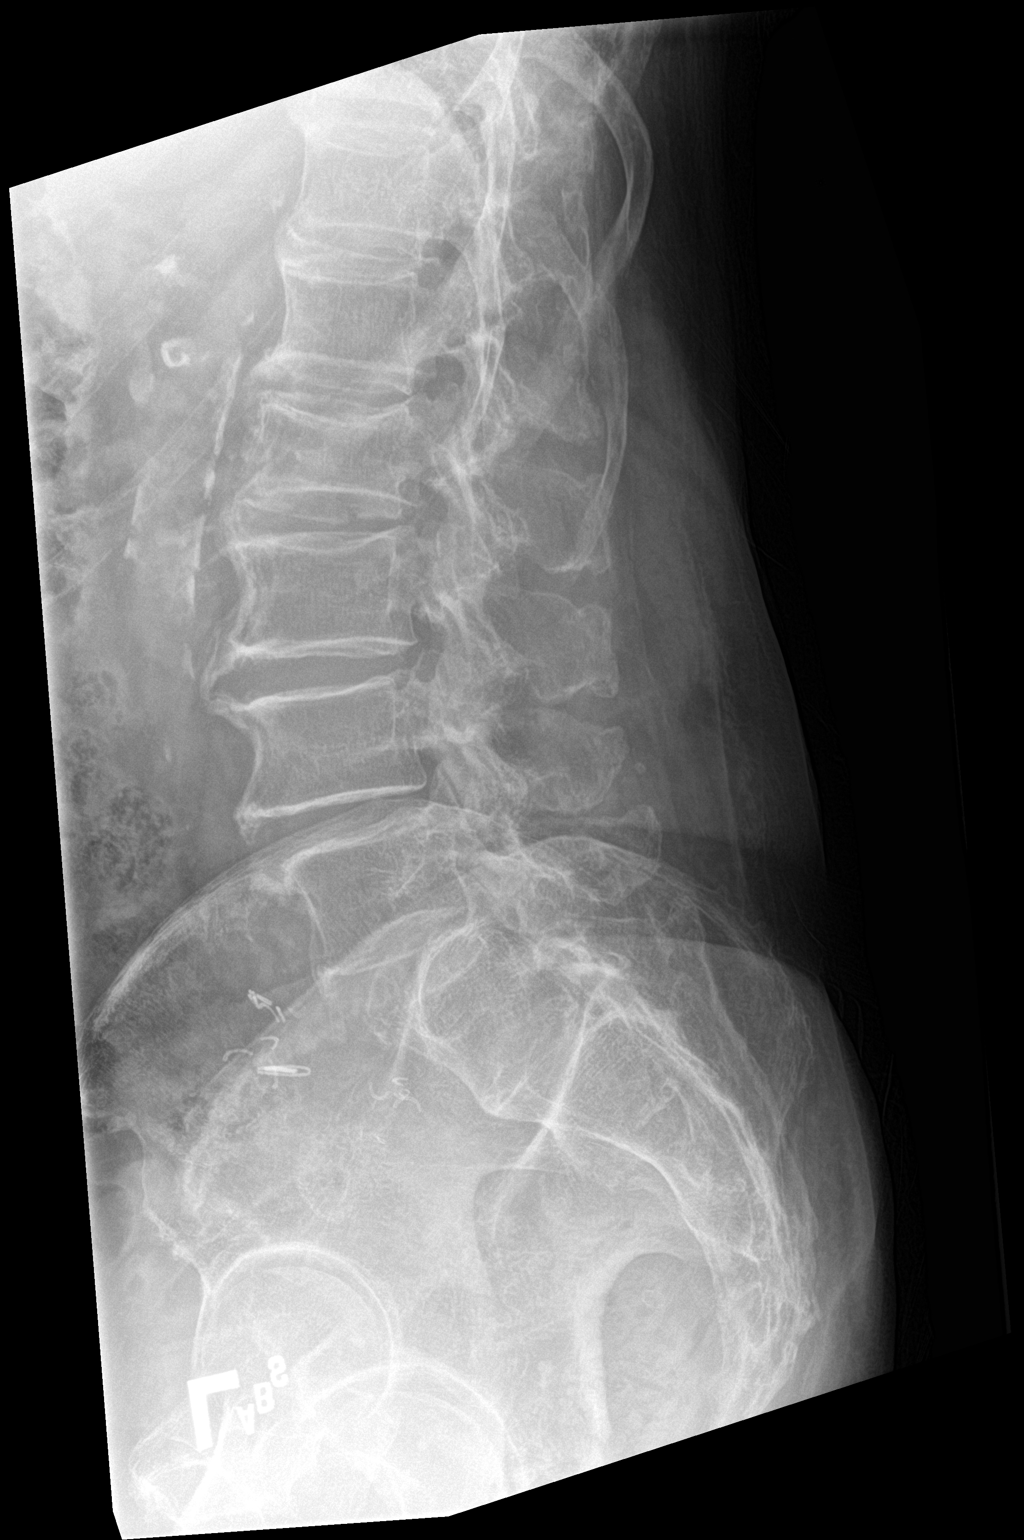

[l-spine spot]
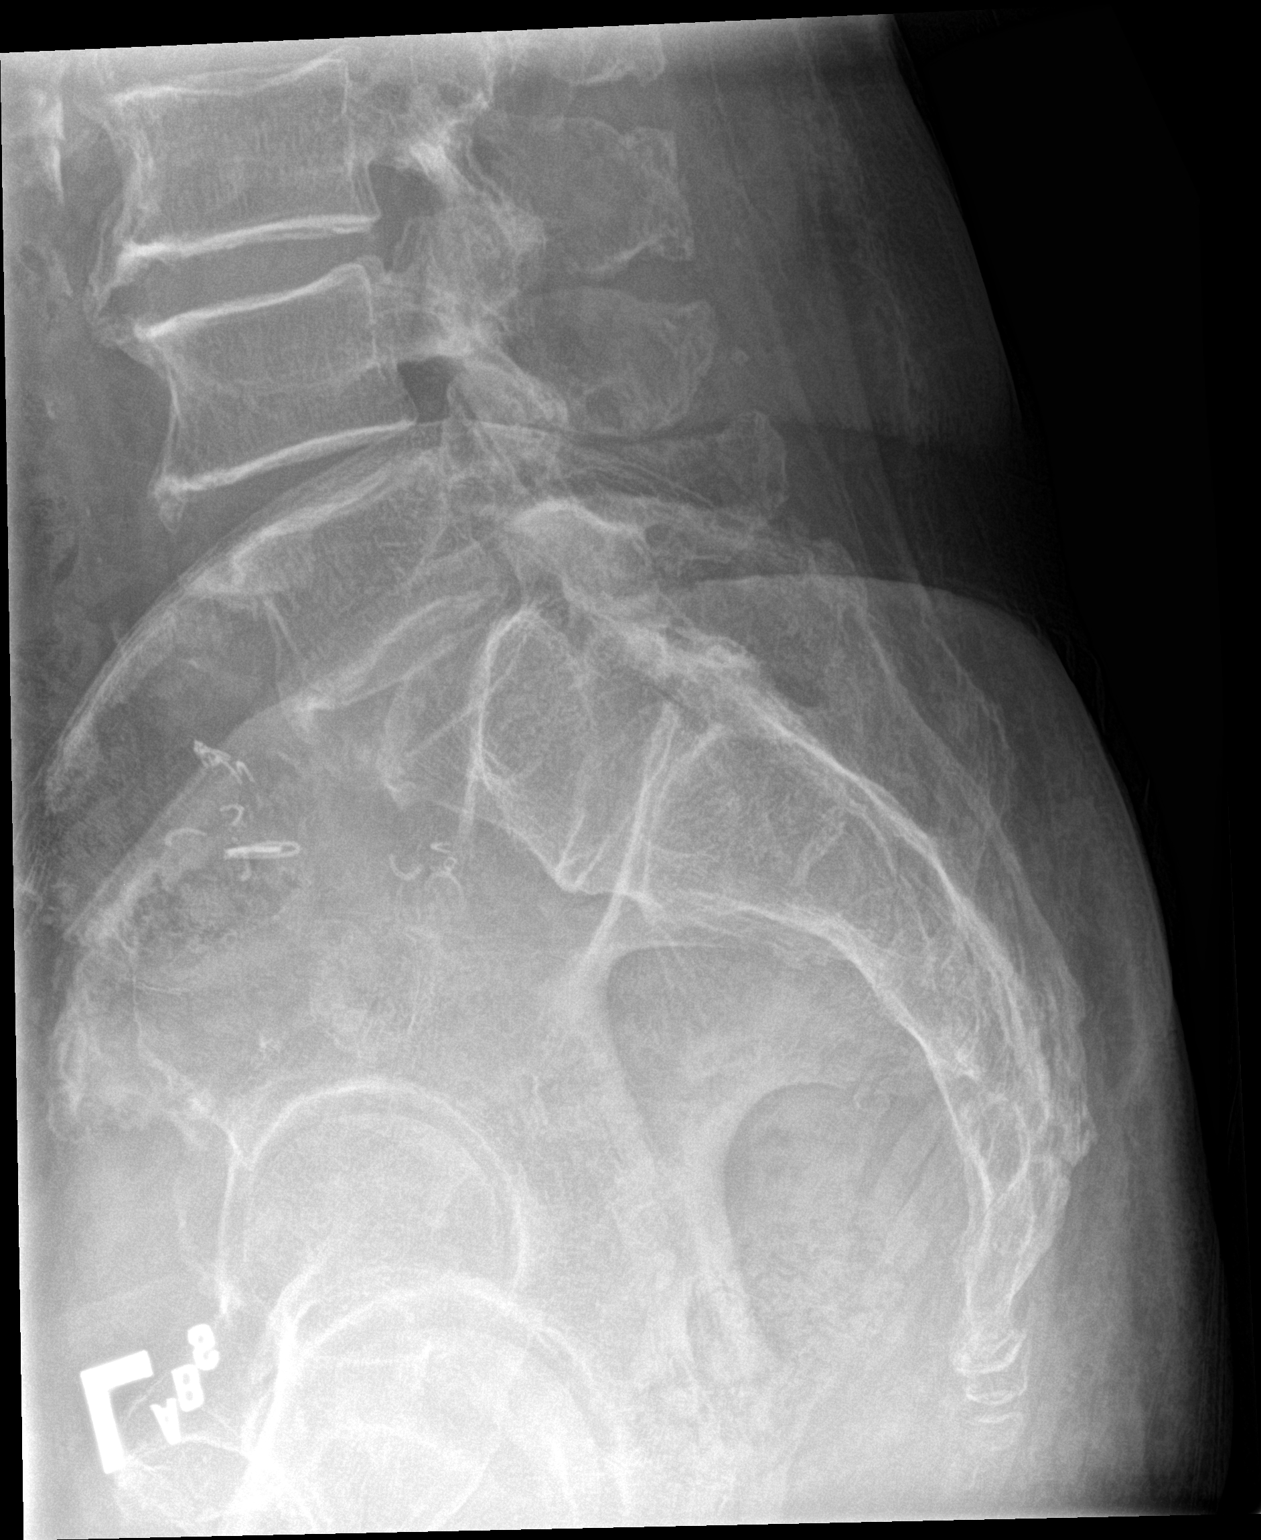

[3 of 3 positions shown; findings below may reference images not displayed]

FINDINGS: Frontal, lateral, and spot lumbosacral lateral images were obtained.
There are 5 non-rib-bearing lumbar type vertebral bodies. There is
mild endplate concavity at L2. No other evidence of fracture. No
spondylolisthesis. There is moderate disc space narrowing at L1-2
and L5-S1. There are anterior osteophytes at all levels. Bones are
osteoporotic. There is aortic atherosclerosis.
IMPRESSION: Mild endplate concavity at L2. No other evident fracture. No
spondylolisthesis. Areas of osteoarthritic change at L1-2 and L5-S1.
Bones osteoporotic. There is aortic atherosclerosis.

## 2016-06-18 IMAGING — CT CT L SPINE W/O CM
5 of 7 series · 16 of 33 positions shown, 17 images · non-contrast
Comparison: Plain films earlier today

CLINICAL DATA: Fall.  Acute low back pain.

EXAM:
CT LUMBAR SPINE WITHOUT CONTRAST
TECHNIQUE: Multidetector CT imaging of the lumbar spine was performed without
intravenous contrast administration. Multiplanar CT image
reconstructions were also generated.

[Series 4: l spine soft · axial · 0.31mm/px · z∈[-387,-249]mm · 5 of 92 slices shown (1 of 2)]
[im 16/92  soft-tissue]
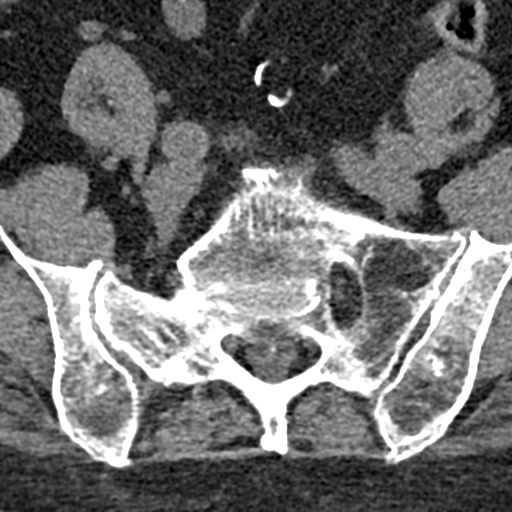
[im 31/92  soft-tissue]
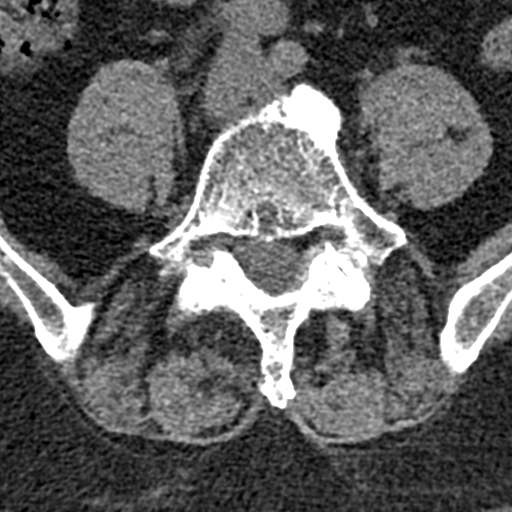
[im 46/92  soft-tissue]
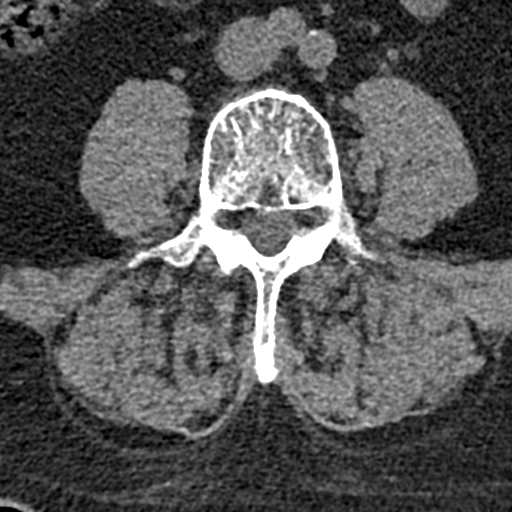
[im 61/92  soft-tissue]
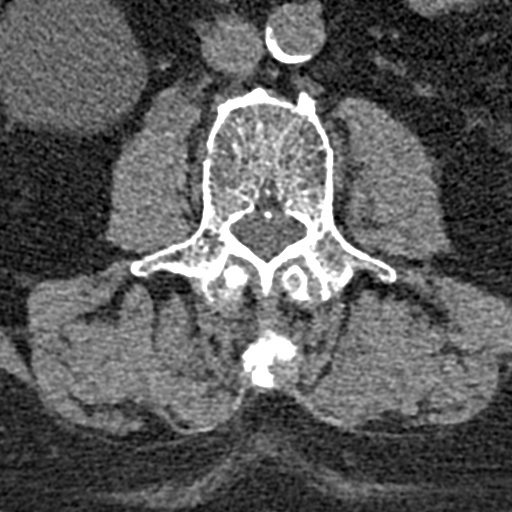
[im 76/92  soft-tissue]
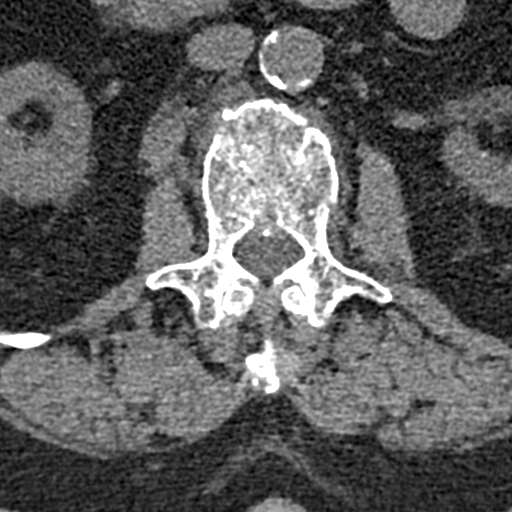

[Series 8: l spine soft · axial · 0.31mm/px · z∈[-246,-214]mm · 2 of 49 slices shown (2 of 2)]
[im 17/49  soft-tissue]
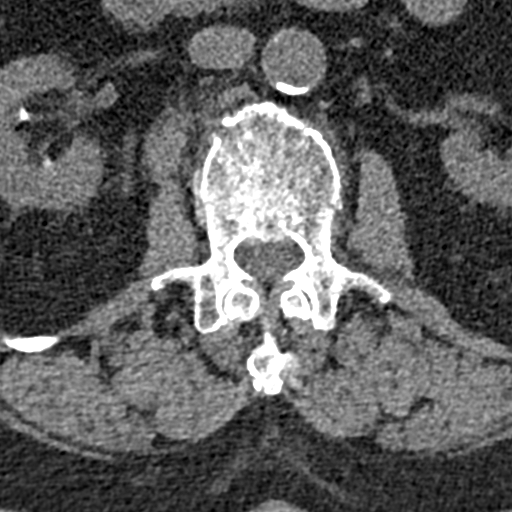
[im 33/49  soft-tissue]
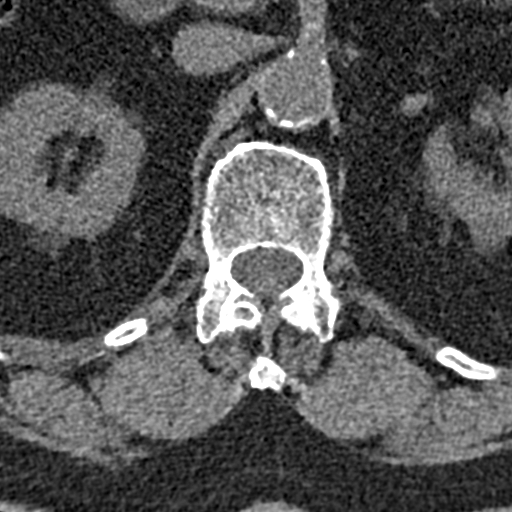

[Series 9: sagittal bone · sagittal · 0.29mm/px · 5 of 63 slices shown]
[im 11/63  bone]
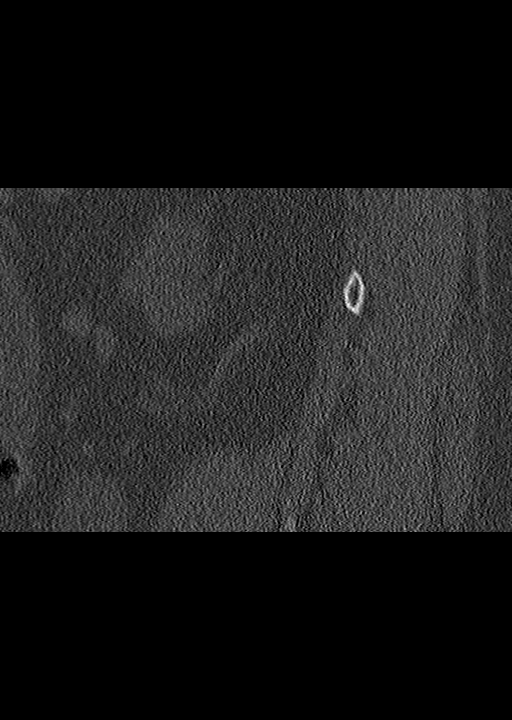
[im 21/63  bone]
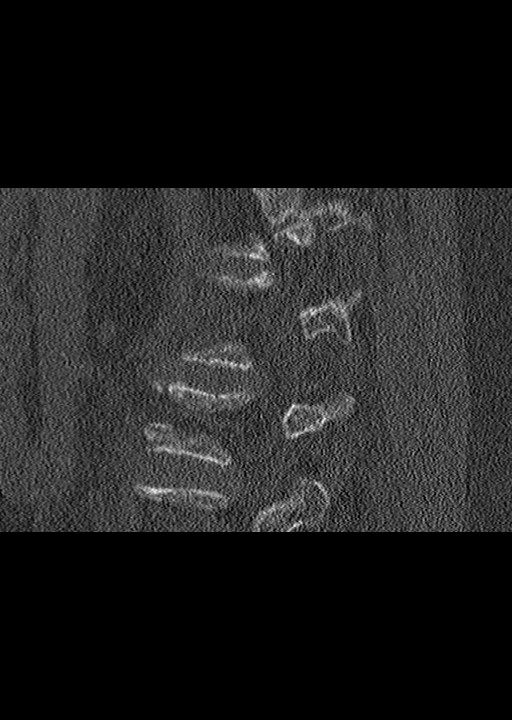
[im 32/63  bone]
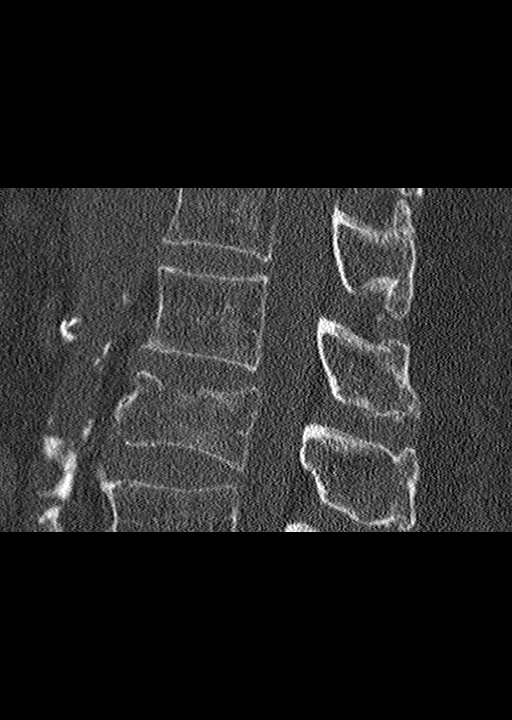
[im 42/63  bone]
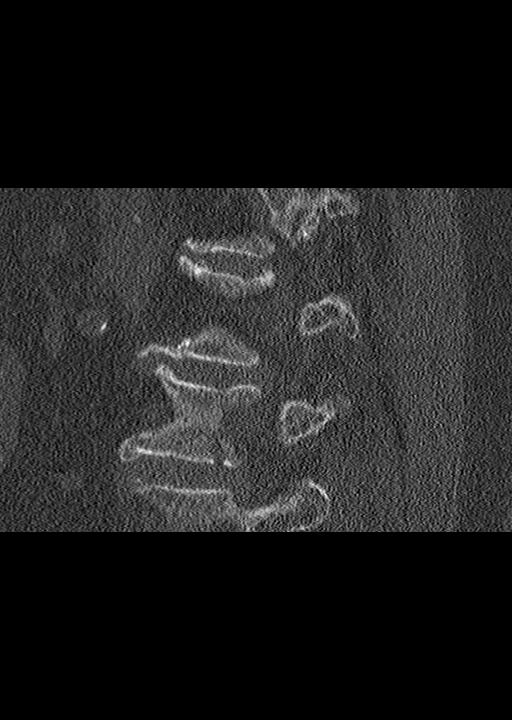
[im 52/63  bone]
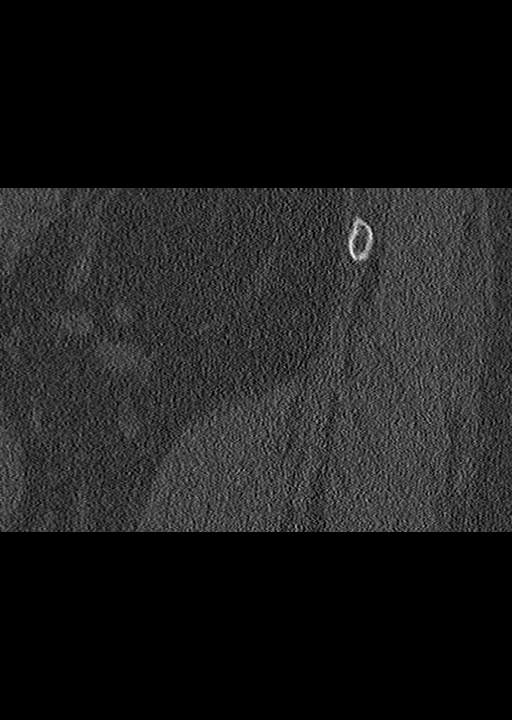

[Series 10: coronal bone · coronal · 0.29mm/px · 1 of 59 slices shown]
[im 30/59  bone]
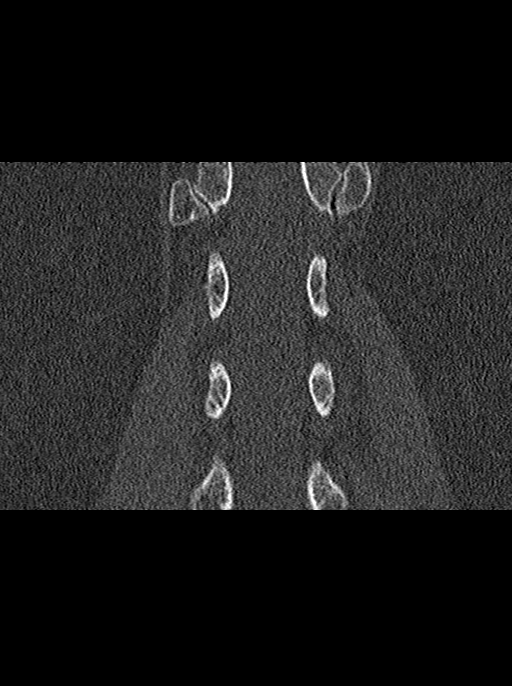

[Series 17: multi disc 1 · axial · 0.21mm/px · z∈[-285,-177]mm · 3 of 45 slices shown, 4 images]
[im 1/45  soft-tissue]
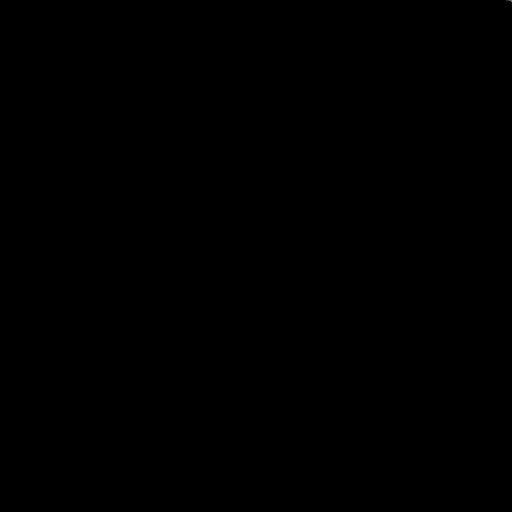
[im 1/45  bone]
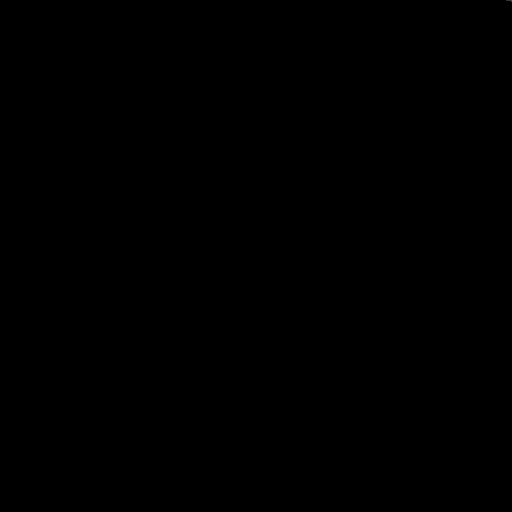
[im 23/45  bone]
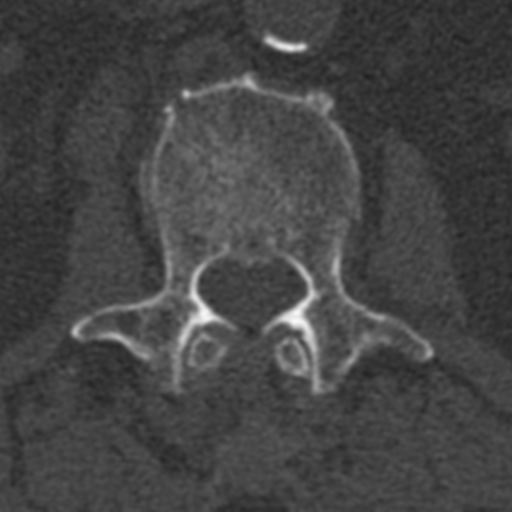
[im 45/45  bone]
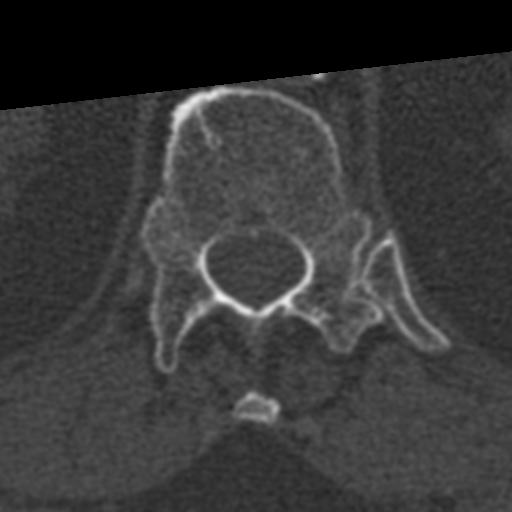

[16 of 33 positions shown; findings below may reference images not displayed]

FINDINGS: Segmentation: Transitional anatomy at the lumbosacral junction. The
last open the disc space is considered L5-S1.

Alignment: Normal

Vertebrae: Mild compression fracture involving the L2 vertebral
body. No retropulsed fracture fragments. Mild loss of vertebral
height of approximately 25%.

Paraspinal and other soft tissues: No paraspinal or epidural
hematoma.

Disc levels: Disc spaces maintained.
IMPRESSION: Mild compression fracture of the L2 vertebral body with loss of
approximately 25% vertebral body height. No retropulsed fracture
fragments.

## 2016-06-18 IMAGING — CR DG LUMBAR SPINE 2-3V
2 series · 2 of 2 positions shown · non-contrast
Comparison: CT of lumbar spine [DATE]

CLINICAL DATA: Status post fall earlier today. Patient had a lumbar
CT earlier today.

EXAM:
LUMBAR SPINE - 2-3 VIEW

[l-spine lat]
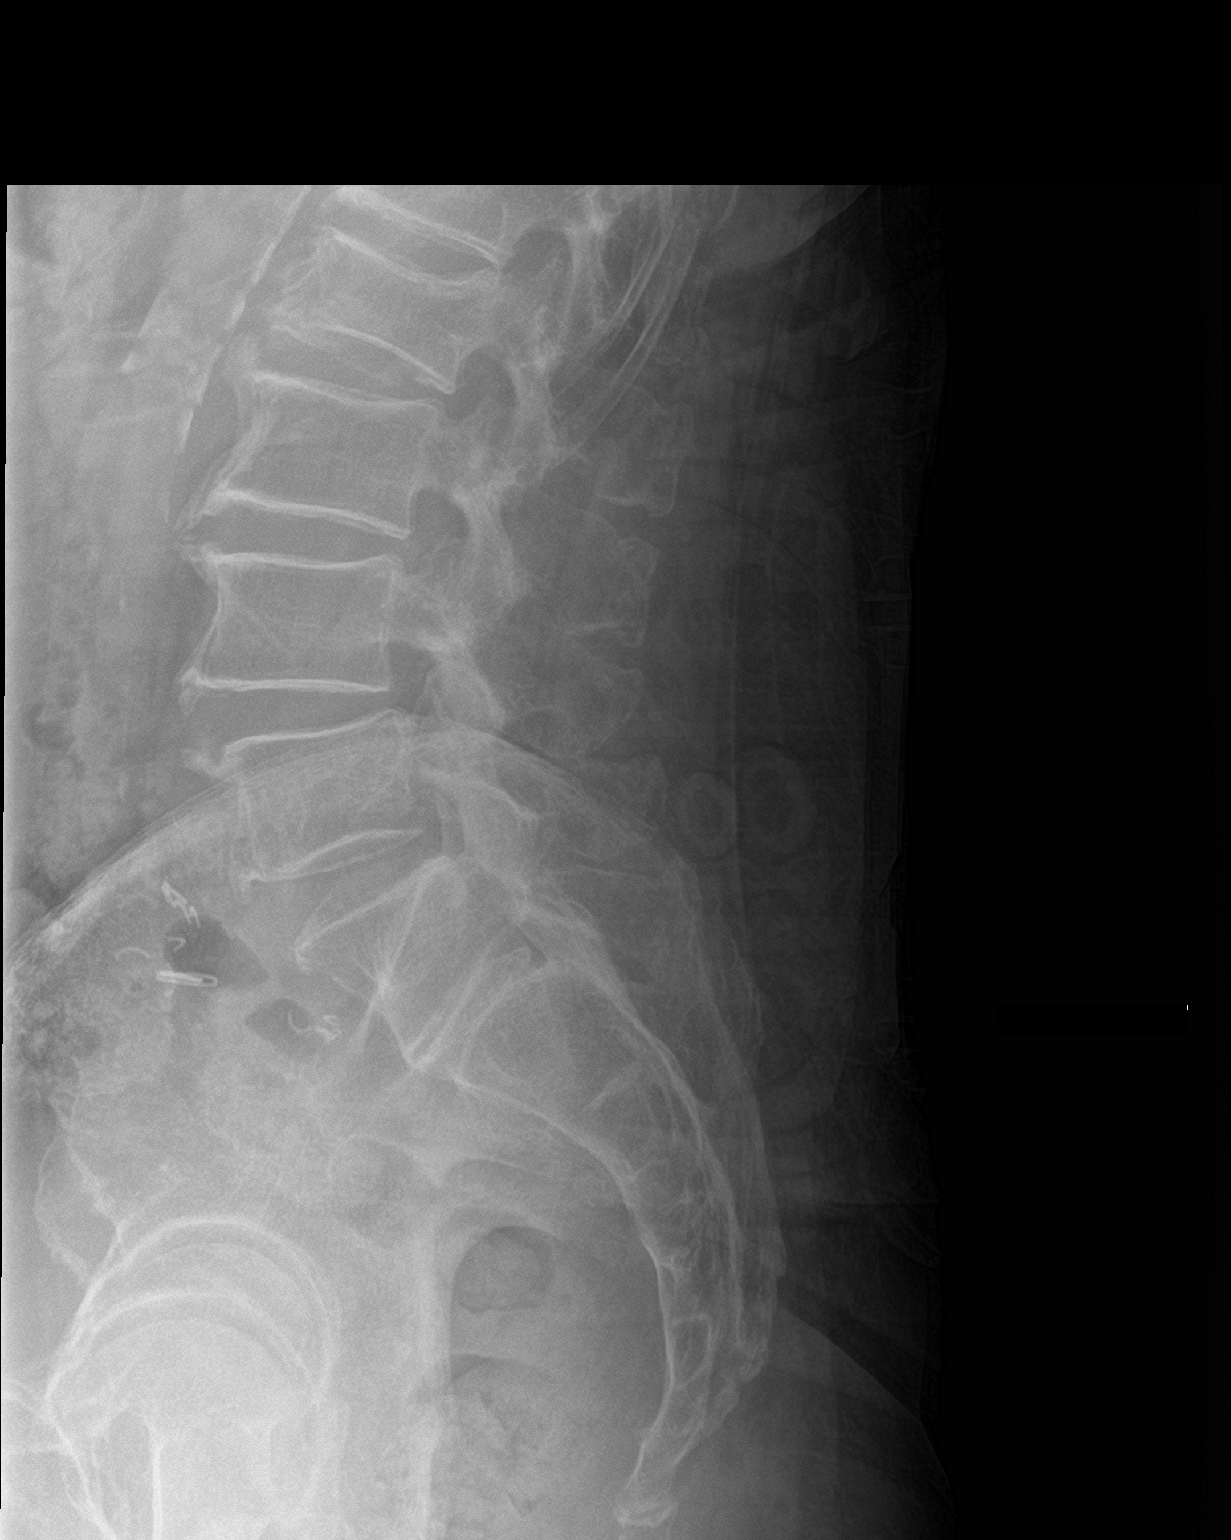

[l-spine ap]
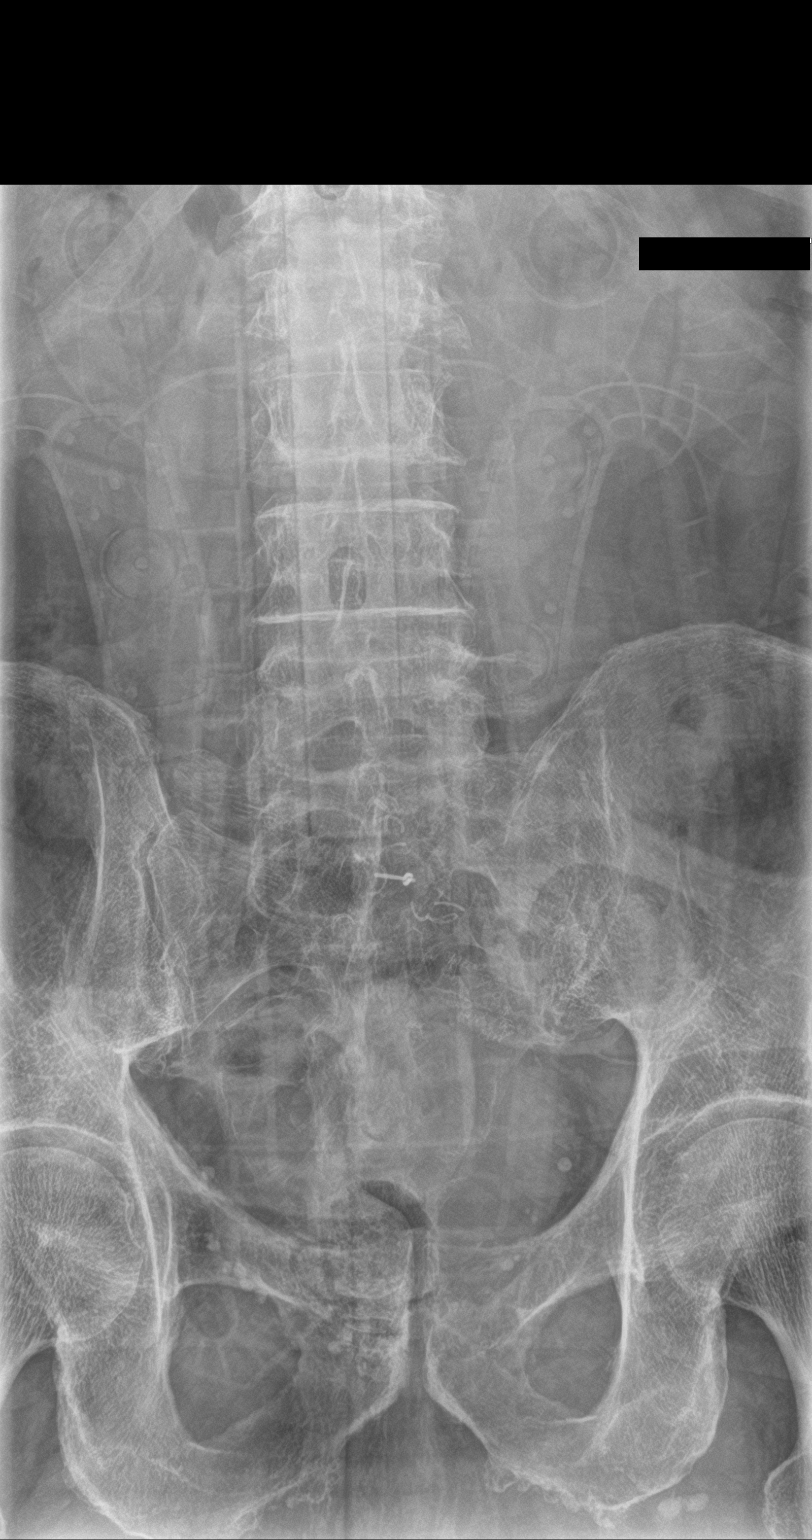

[2 of 2 positions shown; findings below may reference images not displayed]

FINDINGS: There is 25% compression deformity of L2 as seen on CT from earlier
today. No displaced fragment is noted. Degenerative joint changes
are identified throughout spine.
IMPRESSION: 25% compression deformity of L2 is seen on CT from earlier today.

## 2016-06-18 MED ORDER — OXYCODONE-ACETAMINOPHEN 5-325 MG PO TABS
2.0000 | ORAL_TABLET | Freq: Once | ORAL | Status: AC
  Administered 2016-06-18: 2 via ORAL
  Filled 2016-06-18: qty 2

## 2016-06-18 MED ORDER — KETOROLAC TROMETHAMINE 30 MG/ML IJ SOLN
30.0000 mg | Freq: Once | INTRAMUSCULAR | Status: AC
  Administered 2016-06-18: 30 mg via INTRAMUSCULAR
  Filled 2016-06-18: qty 1

## 2016-06-18 MED ORDER — METHOCARBAMOL 500 MG PO TABS: 500.0000 mg | ORAL_TABLET | Freq: Three times a day (TID) | ORAL | 0 refills | Status: DC | PRN

## 2016-06-18 MED ORDER — OXYCODONE-ACETAMINOPHEN 5-325 MG PO TABS: 2.0000 | ORAL_TABLET | Freq: Four times a day (QID) | ORAL | 0 refills | Status: DC | PRN

## 2016-06-18 NOTE — ED Provider Notes (Signed)
 -----------------------------------------   5:27 PM on 06/18/2016 -----------------------------------------  Is given a message from our charge nurse Marya Amsler that Dr. Aris Lot of neurosurgery had requested a call back on this patient after he received electronic medical record request for follow-up after the patient was discharged from the ED this morning. After discussed with Dr. Aris Lot, he recommends that the patient have an off-the-shelf TLSO brace as soon as possible and then an upright x-ray of the lumbar spine to evaluate stability of alignment. I called the patient back at home and spoke with him and he indicates that he will be or to come back to the ER in 30-60 minutes. He is agreeable to this plan.    Carrie Mew, MD 06/18/16 1729

## 2016-06-18 NOTE — ED Provider Notes (Signed)
[facility] Emergency Department Provider Note       Time seen: ----------------------------------------- 10:03 AM on 06/18/2016 -----------------------------------------     I have reviewed the triage vital signs and the nursing notes.   HISTORY   Chief Complaint Fall    HPI Aaron Burton is a 83 y.o. male who presents to the ED after a fall. Patient was on a ladder and trying to adjust a light pole at his house when the light pole snapped and he states he "rode the ladder to the ground". Patient states he landed on his feet, felt pain in his low back and immediately laid down. He complains of low back pain otherwise denies complaints. Pain is not severe at this time. Pain is 3 out of 10.   No past medical history on file.  There are no active problems to display for this patient.   No past surgical history on file.  Allergies Patient has no allergy information on record.  Social History Social History  Substance Use Topics  . Smoking status: Not on file  . Smokeless tobacco: Not on file  . Alcohol use Not on file    Review of Systems Constitutional: Negative for fever. Eyes: Negative for vision changes ENT:  Negative for congestion, sore throat Cardiovascular: Negative for chest pain. Respiratory: Negative for shortness of breath. Gastrointestinal: Negative for abdominal pain, vomiting and diarrhea. Musculoskeletal: Positive for back pain Skin: Negative for rash. Neurological: Negative for headaches, focal weakness or numbness. Negative for radicular pain  All systems negative/normal/unremarkable except as stated in the HPI  ____________________________________________   PHYSICAL EXAM:  VITAL SIGNS: ED Triage Vitals [06/18/16 1000]  Enc Vitals Group     BP (!) 186/67     Pulse Rate 79     Resp 18     Temp 97.9 F (36.6 C)     Temp Source Oral     SpO2 96 %     Weight      Height      Head Circumference      Peak Flow       Pain Score 3     Pain Loc      Pain Edu?      Excl. in [place]?     Constitutional: Alert and oriented. Well appearing and in no distress. Eyes: Conjunctivae are normal. PERRL. Normal extraocular movements. ENT   Head: Normocephalic and atraumatic.   Nose: No congestion/rhinnorhea.   Mouth/Throat: Mucous membranes are moist.   Neck: No stridor. Cardiovascular: Normal rate, regular rhythm. No murmurs, rubs, or gallops. Respiratory: Normal respiratory effort without tachypnea nor retractions. Breath sounds are clear and equal bilaterally. No wheezes/rales/rhonchi. Gastrointestinal: Soft and nontender. Normal bowel sounds Musculoskeletal: Nontender with normal range of motion in extremities. No lower extremity tenderness nor edema. Negative cross straight leg raise examination. LS-spine tenderness is noted. Neurologic:  Normal speech and language. No gross focal neurologic deficits are appreciated.  Skin:  Skin is warm, dry and intact. No rash noted. Psychiatric: Mood and affect are normal. Speech and behavior are normal.  ____________________________________________  ED COURSE:  Pertinent labs & imaging results that were available during my care of the patient were reviewed by me and considered in my medical decision making (see chart for details). Patient presents for low back pain, we will assess with labs and imaging as indicated.   Procedures ____________________________________________   RADIOLOGY Images were viewed by me  Lumbar spine x-rays  IMPRESSION: Mild compression fracture  of the L2 vertebral body with loss of approximately 25% vertebral body height. No retropulsed fracture fragments. ____________________________________________  FINAL ASSESSMENT AND PLAN  Fall, lumbar compression fracture  Plan: Patient's  imaging were dictated above. Patient had presented for a fall off of a ladder. He has low back pain from a L2 compression fracture of 25%. No  retropulsed fragments. He is neurologically intact. He'll be discharged with pain medicine and encouraged to have outpatient follow-up.   Earleen Newport, MD   Note: This note was generated in part or whole with voice recognition software. Voice recognition is usually quite accurate but there are transcription errors that can and very often do occur. I apologize for any typographical errors that were not detected and corrected.     Earleen Newport, MD 06/18/16 [phone]

## 2016-06-18 NOTE — ED Provider Notes (Signed)
[facility] Emergency Department Provider Note  ____________________________________________  Time seen: Approximately 8:48 PM  I have reviewed the triage vital signs and the nursing notes.   HISTORY  Chief Complaint Back Pain    HPI Aaron Burton is a 83 y.o. male who returns to the ED at my request. He was seen in the ER earlier today for low back pain after a fall off a ladder. He was found to have a 25% lumbar compression fracture on CT scan. He was given oxycodone which she says is controlling his pain very well. However, when the neurosurgery clinic began reviewing his chart to arrange follow-up as requested by the initial provider, they called the ED to ask that the patient be called back for a TLSO brace and upright lumbar x-ray. Patient reports that he has been ambulatory and pain is well controlled with the oxycodone.     Past Medical History:  Diagnosis Date  . Coronary artery disease   . Hypertension      There are no active problems to display for this patient.    Past Surgical History:  Procedure Laterality Date  . ACHILLES TENDON REPAIR    . CAROTID ARTERY ANGIOPLASTY    . COLON SURGERY    . LEG SURGERY    . LITHOTRIPSY       Prior to Admission medications   Medication Sig Start Date End Date Taking? Authorizing Provider  methocarbamol (ROBAXIN) 500 MG tablet Take 1 tablet (500 mg total) by mouth every 8 (eight) hours as needed for muscle spasms. 06/18/16   Carrie Mew, MD  oxyCODONE-acetaminophen (PERCOCET) 5-325 MG tablet Take 2 tablets by mouth every 6 (six) hours as needed for moderate pain or severe pain. 06/18/16   Earleen Newport, MD     Allergies Patient has no known allergies.   No family history on file.  Social History Social History  Substance Use Topics  . Smoking status: Never Smoker  . Smokeless tobacco: Never Used  . Alcohol use No    Review of Systems  Constitutional:   No fever or chills.    Cardiovascular:   No chest pain or syncope. Respiratory:   No dyspnea or cough. Gastrointestinal:   Negative for abdominal pain, vomiting and diarrhea.  Genitourinary:   Negative for dysuria or difficulty urinating. Musculoskeletal:   Low back pain as above Neurological:   Negative for headaches or weakness.  ____________________________________________   PHYSICAL EXAM:  VITAL SIGNS: ED Triage Vitals  Enc Vitals Group     BP 06/18/16 1845 139/65     Pulse Rate 06/18/16 1845 66     Resp 06/18/16 1845 18     Temp 06/18/16 1845 98.4 F (36.9 C)     Temp Source 06/18/16 1845 Oral     SpO2 06/18/16 1845 94 %     Weight 06/18/16 1846 180 lb (81.6 kg)     Height 06/18/16 1846 5\' 7"  (1.702 m)     Head Circumference --      Peak Flow --      Pain Score 06/18/16 1845 4     Pain Loc --      Pain Edu? --      Excl. in [place]? --     Vital signs reviewed, nursing assessments reviewed.   Constitutional:   Alert and oriented. Well appearing and in no distress. Eyes:   No scleral icterus. No conjunctival pallor. PERRL. EOMI.  No nystagmus. ENT   Head:  Normocephalic and atraumatic.   Neck:   No stridor. No SubQ emphysema. No meningismus.No midline spinal tenderness. Full range of motion Cardiovascular:   RRR. Symmetric bilateral radial and DP pulses.  No murmurs.  Respiratory:   Normal respiratory effort without tachypnea nor retractions. Breath sounds are clear and equal bilaterally. No wheezes/rales/rhonchi. Gastrointestinal:   Soft and nontender. Non distended. There is no CVA tenderness.  No rebound, rigidity, or guarding. Genitourinary:   deferred Musculoskeletal:   Normal range of motion in all extremities. No joint effusions.  No lower extremity tenderness.  No edema. Point tenderness around L2 Neurologic:   Normal speech and language.  CN 2-10 normal. Motor grossly intact. Normal gait No gross focal neurologic deficits are appreciated.    ____________________________________________    LABS (pertinent positives/negatives) (all labs ordered are listed, but only abnormal results are displayed) Labs Reviewed - No data to display ____________________________________________   EKG    ____________________________________________    RADIOLOGY  Dg Lumbar Spine 2-3 Views  Result Date: 06/18/2016 CLINICAL DATA:  Status post fall earlier today. Patient had a lumbar CT earlier today. EXAM: LUMBAR SPINE - 2-3 VIEW COMPARISON:  CT of lumbar spine Jun 18, 2016 FINDINGS: There is 25% compression deformity of L2 as seen on CT from earlier today. No displaced fragment is noted. Degenerative joint changes are identified throughout spine. IMPRESSION: 25% compression deformity of L2 is seen on CT from earlier today. Electronically Signed   By: Abelardo Diesel M.D.   On: 06/18/2016 19:55   Dg Lumbar Spine 2-3 Views  Result Date: 06/18/2016 CLINICAL DATA:  Pain following fall EXAM: LUMBAR SPINE - 2-3 VIEW COMPARISON:  None. FINDINGS: Frontal, lateral, and spot lumbosacral lateral images were obtained. There are 5 non-rib-bearing lumbar type vertebral bodies. There is mild endplate concavity at L2. No other evidence of fracture. No spondylolisthesis. There is moderate disc space narrowing at L1-2 and L5-S1. There are anterior osteophytes at all levels. Bones are osteoporotic. There is aortic atherosclerosis. IMPRESSION: Mild endplate concavity at L2. No other evident fracture. No spondylolisthesis. 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 Bones osteoporotic. There is aortic atherosclerosis. Electronically Signed   By: Lowella Grip III M.D.   On: 06/18/2016 10:27   Ct Lumbar Spine Wo Contrast  Result Date: 06/18/2016 CLINICAL DATA:  Fall.  Acute low back pain. EXAM: CT LUMBAR SPINE WITHOUT CONTRAST TECHNIQUE: Multidetector CT imaging of the lumbar spine was performed without intravenous contrast administration. Multiplanar CT image  reconstructions were also generated. COMPARISON:  Plain films earlier today FINDINGS: Segmentation: Transitional anatomy at the lumbosacral junction. The last open the disc space is considered L5-S1. Alignment: Normal Vertebrae: Mild compression fracture involving the L2 vertebral body. No retropulsed fracture fragments. Mild loss of vertebral height of approximately 25%. Paraspinal and other soft tissues: No paraspinal or epidural hematoma. Disc levels: Disc spaces maintained. IMPRESSION: Mild compression fracture of the L2 vertebral body with loss of approximately 25% vertebral body height. No retropulsed fracture fragments. Electronically Signed   By: Rolm Baptise M.D.   On: 06/18/2016 11:30    ____________________________________________   PROCEDURES Procedures  ____________________________________________   INITIAL IMPRESSION / ASSESSMENT AND PLAN / ED COURSE  Pertinent labs & imaging results that were available during my care of the patient were reviewed by me and considered in my medical decision making (see chart for details).  Patient well appearing no acute distress, return to the ED for neurosurgery accommodations prior to outpatient follow-up. Receive TLSO brace. Upright  x-ray unremarkable and shows good spinal alignment. Reviewed by Dr. Aris Lot, discussed with Dr. Aris Lot at 8:50 PM who advises that alignment is good and stable and patient is suitable for outpatient follow-up. We'll discharge home. All questions answered. Return Precautions given.     Clinical Course as of Jun 19 2046  Mon Jun 18, 2016  2029 Dr. Aris Lot updated, will callback with recs.  DG Lumbar Spine 2-3 Views [PS]    Clinical Course User Index [PS] Carrie Mew, MD     ____________________________________________   FINAL CLINICAL IMPRESSION(S) / ED DIAGNOSES  Final diagnoses:  Closed compression fracture of L2 lumbar vertebra with routine healing, subsequent encounter      New Prescriptions    METHOCARBAMOL (ROBAXIN) 500 MG TABLET    Take 1 tablet (500 mg total) by mouth every 8 (eight) hours as needed for muscle spasms.     Portions of this note were generated with dragon dictation software. Dictation errors may occur despite best attempts at proofreading.    Carrie Mew, MD 06/18/16 2059

## 2016-06-18 NOTE — ED Triage Notes (Signed)
Patient presents to the ED after being called by ED doctor and asked to come back due to results from earlier visit in ED.  Patient is in no obvious distress at this time.

## 2016-06-18 NOTE — ED Notes (Signed)
Pt a/o. No c/o numbness or tingling in arms or legs.

## 2016-06-18 NOTE — Progress Notes (Signed)
I have reviewed the clinical course exam and imaging. Patient is neurologically w/o deficit after discussion with ER staff Dr. Joni Fears. CT reviewed; no evidence of posterior element fracture s/p fall. Fracture isolated to anterior column without retropulsion. Upright xrays in brace stable. Will arrange outpatient clinic follow up with repeat xrays. Brace should be worn at all times when out of bed. Return to clinic or ER earlier with progression or worsening of pain.  Please call w/ any questions  Era Bumpers, MD

## 2016-06-18 NOTE — ED Notes (Signed)
Outside Vendor at bedside for ortho placement

## 2016-06-18 NOTE — ED Triage Notes (Signed)
Pt via ems from home after falling (or "riding the ladder down") when the pole he was propped against gave way. Pt states he landed on his feet and he felt his lower back "jam." Pt alert & oriented with NAD noted.

## 2016-06-18 NOTE — ED Notes (Signed)
Phone call from [place]. Will be here in 20 min. Pt informed.

## 2016-06-18 NOTE — ED Notes (Signed)
Page made to [employer].  Awaiting response.

## 2016-06-18 NOTE — Discharge Instructions (Signed)
Wear the back brace at all times when out of bed. You can take it off while in bed. Continue taking your pain medicine as needed. Take Robaxin as needed as well for muscle relaxation. The neurosurgery clinic will call you soon to set up a follow-up appointment in 1 week.

## 2016-06-27 DIAGNOSIS — R9089 Other abnormal findings on diagnostic imaging of central nervous system: Secondary | ICD-10-CM | POA: Diagnosis not present

## 2016-06-27 DIAGNOSIS — W11XXXD Fall on and from ladder, subsequent encounter: Secondary | ICD-10-CM | POA: Diagnosis not present

## 2016-06-27 DIAGNOSIS — S32020A Wedge compression fracture of second lumbar vertebra, initial encounter for closed fracture: Secondary | ICD-10-CM | POA: Diagnosis not present

## 2016-06-27 DIAGNOSIS — S32020D Wedge compression fracture of second lumbar vertebra, subsequent encounter for fracture with routine healing: Secondary | ICD-10-CM | POA: Diagnosis not present

## 2016-06-27 DIAGNOSIS — M545 Low back pain: Secondary | ICD-10-CM | POA: Diagnosis not present

## 2016-07-16 DIAGNOSIS — R809 Proteinuria, unspecified: Secondary | ICD-10-CM | POA: Diagnosis not present

## 2016-07-16 DIAGNOSIS — N183 Chronic kidney disease, stage 3 (moderate): Secondary | ICD-10-CM | POA: Diagnosis not present

## 2016-07-16 DIAGNOSIS — I129 Hypertensive chronic kidney disease with stage 1 through stage 4 chronic kidney disease, or unspecified chronic kidney disease: Secondary | ICD-10-CM | POA: Diagnosis not present

## 2016-07-16 DIAGNOSIS — N2581 Secondary hyperparathyroidism of renal origin: Secondary | ICD-10-CM | POA: Diagnosis not present

## 2016-07-16 DIAGNOSIS — E875 Hyperkalemia: Secondary | ICD-10-CM | POA: Diagnosis not present

## 2016-07-17 DIAGNOSIS — S32020D Wedge compression fracture of second lumbar vertebra, subsequent encounter for fracture with routine healing: Secondary | ICD-10-CM | POA: Diagnosis not present

## 2016-07-17 DIAGNOSIS — S32020G Wedge compression fracture of second lumbar vertebra, subsequent encounter for fracture with delayed healing: Secondary | ICD-10-CM | POA: Diagnosis not present

## 2016-07-17 DIAGNOSIS — S32028A Other fracture of second lumbar vertebra, initial encounter for closed fracture: Secondary | ICD-10-CM | POA: Diagnosis not present

## 2016-09-12 DIAGNOSIS — I10 Essential (primary) hypertension: Secondary | ICD-10-CM | POA: Diagnosis not present

## 2016-09-12 DIAGNOSIS — R7303 Prediabetes: Secondary | ICD-10-CM | POA: Diagnosis not present

## 2016-09-12 DIAGNOSIS — N183 Chronic kidney disease, stage 3 (moderate): Secondary | ICD-10-CM | POA: Diagnosis not present

## 2016-09-12 DIAGNOSIS — E7800 Pure hypercholesterolemia, unspecified: Secondary | ICD-10-CM | POA: Diagnosis not present

## 2016-09-18 DIAGNOSIS — S32020D Wedge compression fracture of second lumbar vertebra, subsequent encounter for fracture with routine healing: Secondary | ICD-10-CM | POA: Diagnosis not present

## 2016-09-18 DIAGNOSIS — S32020A Wedge compression fracture of second lumbar vertebra, initial encounter for closed fracture: Secondary | ICD-10-CM | POA: Diagnosis not present

## 2016-09-19 DIAGNOSIS — R7303 Prediabetes: Secondary | ICD-10-CM | POA: Diagnosis not present

## 2016-09-19 DIAGNOSIS — Z0000 Encounter for general adult medical examination without abnormal findings: Secondary | ICD-10-CM | POA: Diagnosis not present

## 2016-09-19 DIAGNOSIS — N183 Chronic kidney disease, stage 3 (moderate): Secondary | ICD-10-CM | POA: Diagnosis not present

## 2016-09-19 DIAGNOSIS — E7800 Pure hypercholesterolemia, unspecified: Secondary | ICD-10-CM | POA: Diagnosis not present

## 2016-10-08 DIAGNOSIS — Z08 Encounter for follow-up examination after completed treatment for malignant neoplasm: Secondary | ICD-10-CM | POA: Diagnosis not present

## 2016-10-08 DIAGNOSIS — Z85828 Personal history of other malignant neoplasm of skin: Secondary | ICD-10-CM | POA: Diagnosis not present

## 2016-10-08 DIAGNOSIS — L570 Actinic keratosis: Secondary | ICD-10-CM | POA: Diagnosis not present

## 2016-10-08 DIAGNOSIS — D485 Neoplasm of uncertain behavior of skin: Secondary | ICD-10-CM | POA: Diagnosis not present

## 2016-10-08 DIAGNOSIS — C44329 Squamous cell carcinoma of skin of other parts of face: Secondary | ICD-10-CM | POA: Diagnosis not present

## 2016-10-08 DIAGNOSIS — X32XXXA Exposure to sunlight, initial encounter: Secondary | ICD-10-CM | POA: Diagnosis not present

## 2016-11-13 DIAGNOSIS — S32020D Wedge compression fracture of second lumbar vertebra, subsequent encounter for fracture with routine healing: Secondary | ICD-10-CM | POA: Diagnosis not present

## 2016-11-21 DIAGNOSIS — L905 Scar conditions and fibrosis of skin: Secondary | ICD-10-CM | POA: Diagnosis not present

## 2016-11-21 DIAGNOSIS — C44329 Squamous cell carcinoma of skin of other parts of face: Secondary | ICD-10-CM | POA: Diagnosis not present

## 2016-11-28 DIAGNOSIS — D0439 Carcinoma in situ of skin of other parts of face: Secondary | ICD-10-CM | POA: Diagnosis not present

## 2016-11-28 DIAGNOSIS — C44329 Squamous cell carcinoma of skin of other parts of face: Secondary | ICD-10-CM | POA: Diagnosis not present

## 2016-11-28 DIAGNOSIS — L538 Other specified erythematous conditions: Secondary | ICD-10-CM | POA: Diagnosis not present

## 2016-11-28 DIAGNOSIS — L820 Inflamed seborrheic keratosis: Secondary | ICD-10-CM | POA: Diagnosis not present

## 2016-11-28 DIAGNOSIS — L905 Scar conditions and fibrosis of skin: Secondary | ICD-10-CM | POA: Diagnosis not present

## 2016-11-29 DIAGNOSIS — T8140XA Infection following a procedure, unspecified, initial encounter: Secondary | ICD-10-CM | POA: Diagnosis not present

## 2017-01-15 DIAGNOSIS — N183 Chronic kidney disease, stage 3 (moderate): Secondary | ICD-10-CM | POA: Diagnosis not present

## 2017-01-15 DIAGNOSIS — E875 Hyperkalemia: Secondary | ICD-10-CM | POA: Diagnosis not present

## 2017-01-15 DIAGNOSIS — R809 Proteinuria, unspecified: Secondary | ICD-10-CM | POA: Diagnosis not present

## 2017-01-15 DIAGNOSIS — I129 Hypertensive chronic kidney disease with stage 1 through stage 4 chronic kidney disease, or unspecified chronic kidney disease: Secondary | ICD-10-CM | POA: Diagnosis not present

## 2017-03-05 DIAGNOSIS — Z08 Encounter for follow-up examination after completed treatment for malignant neoplasm: Secondary | ICD-10-CM | POA: Diagnosis not present

## 2017-03-05 DIAGNOSIS — L820 Inflamed seborrheic keratosis: Secondary | ICD-10-CM | POA: Diagnosis not present

## 2017-03-05 DIAGNOSIS — X32XXXA Exposure to sunlight, initial encounter: Secondary | ICD-10-CM | POA: Diagnosis not present

## 2017-03-05 DIAGNOSIS — C44321 Squamous cell carcinoma of skin of nose: Secondary | ICD-10-CM | POA: Diagnosis not present

## 2017-03-05 DIAGNOSIS — L570 Actinic keratosis: Secondary | ICD-10-CM | POA: Diagnosis not present

## 2017-03-05 DIAGNOSIS — D485 Neoplasm of uncertain behavior of skin: Secondary | ICD-10-CM | POA: Diagnosis not present

## 2017-03-05 DIAGNOSIS — Z85828 Personal history of other malignant neoplasm of skin: Secondary | ICD-10-CM | POA: Diagnosis not present

## 2017-03-15 DIAGNOSIS — N183 Chronic kidney disease, stage 3 (moderate): Secondary | ICD-10-CM | POA: Diagnosis not present

## 2017-03-15 DIAGNOSIS — R7303 Prediabetes: Secondary | ICD-10-CM | POA: Diagnosis not present

## 2017-03-15 DIAGNOSIS — E7800 Pure hypercholesterolemia, unspecified: Secondary | ICD-10-CM | POA: Diagnosis not present

## 2017-03-15 DIAGNOSIS — D638 Anemia in other chronic diseases classified elsewhere: Secondary | ICD-10-CM | POA: Diagnosis not present

## 2017-03-15 DIAGNOSIS — D696 Thrombocytopenia, unspecified: Secondary | ICD-10-CM | POA: Diagnosis not present

## 2017-03-27 DIAGNOSIS — D638 Anemia in other chronic diseases classified elsewhere: Secondary | ICD-10-CM | POA: Diagnosis not present

## 2017-03-27 DIAGNOSIS — I10 Essential (primary) hypertension: Secondary | ICD-10-CM | POA: Diagnosis not present

## 2017-03-27 DIAGNOSIS — N183 Chronic kidney disease, stage 3 (moderate): Secondary | ICD-10-CM | POA: Diagnosis not present

## 2017-03-27 DIAGNOSIS — R7303 Prediabetes: Secondary | ICD-10-CM | POA: Diagnosis not present

## 2017-03-27 DIAGNOSIS — E7800 Pure hypercholesterolemia, unspecified: Secondary | ICD-10-CM | POA: Diagnosis not present

## 2017-04-12 DIAGNOSIS — L905 Scar conditions and fibrosis of skin: Secondary | ICD-10-CM | POA: Diagnosis not present

## 2017-04-12 DIAGNOSIS — C44391 Other specified malignant neoplasm of skin of nose: Secondary | ICD-10-CM | POA: Diagnosis not present

## 2017-05-07 DIAGNOSIS — H903 Sensorineural hearing loss, bilateral: Secondary | ICD-10-CM | POA: Diagnosis not present

## 2017-05-09 DIAGNOSIS — H903 Sensorineural hearing loss, bilateral: Secondary | ICD-10-CM | POA: Diagnosis not present

## 2017-07-24 DIAGNOSIS — N2581 Secondary hyperparathyroidism of renal origin: Secondary | ICD-10-CM | POA: Diagnosis not present

## 2017-07-24 DIAGNOSIS — I129 Hypertensive chronic kidney disease with stage 1 through stage 4 chronic kidney disease, or unspecified chronic kidney disease: Secondary | ICD-10-CM | POA: Diagnosis not present

## 2017-07-24 DIAGNOSIS — E1122 Type 2 diabetes mellitus with diabetic chronic kidney disease: Secondary | ICD-10-CM | POA: Diagnosis not present

## 2017-07-24 DIAGNOSIS — N183 Chronic kidney disease, stage 3 (moderate): Secondary | ICD-10-CM | POA: Diagnosis not present

## 2017-07-24 DIAGNOSIS — R809 Proteinuria, unspecified: Secondary | ICD-10-CM | POA: Diagnosis not present

## 2017-09-02 DIAGNOSIS — Z85828 Personal history of other malignant neoplasm of skin: Secondary | ICD-10-CM | POA: Diagnosis not present

## 2017-09-02 DIAGNOSIS — C44622 Squamous cell carcinoma of skin of right upper limb, including shoulder: Secondary | ICD-10-CM | POA: Diagnosis not present

## 2017-09-02 DIAGNOSIS — L821 Other seborrheic keratosis: Secondary | ICD-10-CM | POA: Diagnosis not present

## 2017-09-02 DIAGNOSIS — D485 Neoplasm of uncertain behavior of skin: Secondary | ICD-10-CM | POA: Diagnosis not present

## 2017-09-02 DIAGNOSIS — Z08 Encounter for follow-up examination after completed treatment for malignant neoplasm: Secondary | ICD-10-CM | POA: Diagnosis not present

## 2017-09-02 DIAGNOSIS — L570 Actinic keratosis: Secondary | ICD-10-CM | POA: Diagnosis not present

## 2017-09-02 DIAGNOSIS — C44629 Squamous cell carcinoma of skin of left upper limb, including shoulder: Secondary | ICD-10-CM | POA: Diagnosis not present

## 2017-09-17 DIAGNOSIS — I10 Essential (primary) hypertension: Secondary | ICD-10-CM | POA: Diagnosis not present

## 2017-09-17 DIAGNOSIS — N183 Chronic kidney disease, stage 3 (moderate): Secondary | ICD-10-CM | POA: Diagnosis not present

## 2017-09-17 DIAGNOSIS — D638 Anemia in other chronic diseases classified elsewhere: Secondary | ICD-10-CM | POA: Diagnosis not present

## 2017-09-17 DIAGNOSIS — R7303 Prediabetes: Secondary | ICD-10-CM | POA: Diagnosis not present

## 2017-09-17 DIAGNOSIS — E7800 Pure hypercholesterolemia, unspecified: Secondary | ICD-10-CM | POA: Diagnosis not present

## 2017-09-24 DIAGNOSIS — N183 Chronic kidney disease, stage 3 (moderate): Secondary | ICD-10-CM | POA: Diagnosis not present

## 2017-09-24 DIAGNOSIS — R7303 Prediabetes: Secondary | ICD-10-CM | POA: Diagnosis not present

## 2017-09-24 DIAGNOSIS — Z0000 Encounter for general adult medical examination without abnormal findings: Secondary | ICD-10-CM | POA: Diagnosis not present

## 2017-09-24 DIAGNOSIS — D638 Anemia in other chronic diseases classified elsewhere: Secondary | ICD-10-CM | POA: Diagnosis not present

## 2017-09-24 DIAGNOSIS — I10 Essential (primary) hypertension: Secondary | ICD-10-CM | POA: Diagnosis not present

## 2017-09-24 DIAGNOSIS — E7800 Pure hypercholesterolemia, unspecified: Secondary | ICD-10-CM | POA: Diagnosis not present

## 2017-09-24 DIAGNOSIS — D696 Thrombocytopenia, unspecified: Secondary | ICD-10-CM | POA: Diagnosis not present

## 2017-10-10 ENCOUNTER — Ambulatory Visit
Admission: EM | Admit: 2017-10-10 | Discharge: 2017-10-10 | Disposition: A | Discharge status: Home / Self Care | Payer: PPO | Attending: Family Medicine | Admitting: Family Medicine

## 2017-10-10 ENCOUNTER — Encounter: Payer: Self-pay | Admitting: Emergency Medicine

## 2017-10-10 ENCOUNTER — Other Ambulatory Visit: Payer: Self-pay

## 2017-10-10 DIAGNOSIS — M10071 Idiopathic gout, right ankle and foot: Secondary | ICD-10-CM | POA: Diagnosis not present

## 2017-10-10 DIAGNOSIS — M109 Gout, unspecified: Secondary | ICD-10-CM

## 2017-10-10 DIAGNOSIS — M79674 Pain in right toe(s): Secondary | ICD-10-CM | POA: Diagnosis not present

## 2017-10-10 MED ORDER — PREDNISONE 10 MG PO TABS: ORAL_TABLET | ORAL | 0 refills | Status: DC

## 2017-10-10 NOTE — ED Provider Notes (Signed)
[facility]    CSN: [id number] Arrival date & time: 10/10/17  0802  History   Chief Complaint Chief Complaint  Patient presents with  . Foot Pain    right   HPI  84 year old male presents with right great toe pain.  Patient reports that he developed right great toe pain yesterday.  Was severe last night.  Mild swelling.  Worse with activity.  No relieving factors.  Has no history of gout but is concerned that he may have gout.  He does have chronic renal insufficiency.  No reports of fall, trauma, injury.  No other associated symptoms.  No other complaints.  PMH, Surgical Hx, Family Hx, Social History reviewed and updated as below.  PMH: Thrombocytopenia (Plt 107 - 09/17/17) 09/24/2017  Medicare annual wellness visit, subsequent 09/24/17 09/24/2017  History of SCC (squamous cell carcinoma) of skin - followed by Dr. Evorn Gong 40/98/1191  Systolic murmur, unspecified (3/6 - 03/27/17) 03/27/2017  Anemia of chronic disease (Hgb 13.1 - 09/17/17) 09/19/2016  Medicare annual wellness visit, initial 09/19/16 09/19/2016  DNR (do not resuscitate) 09/19/2016  Vaccine counseling: PNA-23 vaccine- 12/11/12; Zostavax vaccine- 12/04/10; PCV-13 vaccine- 07/28/15; Pt declines Tetanus vaccine (09/19/16) 07/27/2014  Borderline diabetes mellitus (A1c 6.2% - 09/17/17) - diet controlled 01/25/2014  Pure hypercholesterolemia (LDL 87 - 09/17/17)   Essential hypertension   CKD (chronic kidney disease) stage 3 (Cr 1.7, GFR 38 - 09/17/17) - followed by Dr. Juleen China    Past Surgical History:  Procedure Laterality Date  . ACHILLES TENDON REPAIR    . CAROTID ARTERY ANGIOPLASTY    . COLON SURGERY    . LEG SURGERY    . LITHOTRIPSY     Home Medications    Prior to Admission medications   Medication Sig Start Date End Date Taking? Authorizing Provider  amLODipine (NORVASC) 5 MG tablet Take 1 tablet by mouth daily. 02/18/17  Yes [provider]  aspirin EC 81 MG tablet Take 1 tablet by mouth daily.   Yes  [provider]  Cholecalciferol (VITAMIN D-1000 MAX ST) 1000 units tablet Take 1 tablet by mouth 2 (two) times a week.   Yes [provider]  lisinopril (PRINIVIL,ZESTRIL) 2.5 MG tablet Take 1 tablet by mouth daily.   Yes [provider]  Multiple Vitamin (MULTI-VITAMINS) TABS Take 1 tablet by mouth daily.   Yes [provider]  simvastatin (ZOCOR) 20 MG tablet Take 1 tablet by mouth daily. 10/01/17  Yes [provider]  predniSONE (DELTASONE) 10 MG tablet 50 mg daily x 3 days, then 40 mg daily x 3 days, then 30 mg daily x 3 days, then 20 mg daily x 3 days, then 10 mg daily x 3 days. 10/10/17   Coral Spikes, DO   Family History Family History  Problem Relation Age of Onset  . Stroke Mother   . Hypertension Father   . Heart disease Father     Social History Social History   Tobacco Use  . Smoking status: Never Smoker  . Smokeless tobacco: Never Used  Substance Use Topics  . Alcohol use: No  . Drug use: No     Allergies   Patient has no known allergies.   Review of Systems Review of Systems  Constitutional: Negative.   Musculoskeletal:       R great toe pain.   Physical Exam Triage Vital Signs ED Triage Vitals  Enc Vitals Group     BP 10/10/17 0815 (!) 146/70  Pulse Rate 10/10/17 0815 72     Resp 10/10/17 0815 16     Temp 10/10/17 0815 98.2 F (36.8 C)     Temp Source 10/10/17 0815 Oral     SpO2 10/10/17 0815 98 %     Weight 10/10/17 0816 178 lb (80.7 kg)     Height 10/10/17 0816 5\' 7"  (1.702 m)     Head Circumference --      Peak Flow --      Pain Score 10/10/17 0815 3     Pain Loc --      Pain Edu? --      Excl. in [place]? --    Updated Vital Signs BP (!) 146/70 (BP Location: Left Arm)   Pulse 72   Temp 98.2 F (36.8 C) (Oral)   Resp 16   Ht 5\' 7"  (1.702 m)   Wt 80.7 kg   SpO2 98%   BMI 27.88 kg/m   Visual Acuity Right Eye Distance:   Left Eye Distance:   Bilateral Distance:    Right Eye Near:     Left Eye Near:    Bilateral Near:     Physical Exam  Constitutional: He is oriented to person, place, and time. He appears well-developed. No distress.  Cardiovascular: Normal rate and regular rhythm.  Murmur heard. Pulmonary/Chest: Effort normal and breath sounds normal. No respiratory distress.  Musculoskeletal:  R great toe - Mild erythema. Exquisitely tender to palpation at the MTP joint.  Neurological: He is alert and oriented to person, place, and time.  Psychiatric: He has a normal mood and affect. His behavior is normal.  Nursing note and vitals reviewed.  UC Treatments / Results  Labs (all labs ordered are listed, but only abnormal results are displayed) Labs Reviewed - No data to display  EKG None  Radiology No results found.  Procedures Procedures (including critical care time)  Medications Ordered in UC Medications - No data to display  Initial Impression / Assessment and Plan / UC Course  I have reviewed the triage vital signs and the nursing notes.  Pertinent labs & imaging results that were available during my care of the patient were reviewed by me and considered in my medical decision making (see chart for details).    84 year old male presents with suspected gout.  Treating with prednisone.  Advised to follow-up with primary care physician.   Final Clinical Impressions(s) / UC Diagnoses   Final diagnoses:  Acute gout involving toe of right foot, unspecified cause     Discharge Instructions     This is likely gout.  Medication as prescribed.  Be sure to follow up with your PCP.  Take care  Dr. Lacinda Axon     ED Prescriptions    Medication Sig Dispense Auth. Provider   predniSONE (DELTASONE) 10 MG tablet 50 mg daily x 3 days, then 40 mg daily x 3 days, then 30 mg daily x 3 days, then 20 mg daily x 3 days, then 10 mg daily x 3 days. 45 tablet Coral Spikes, DO     Controlled Substance Prescriptions  Controlled Substance Registry  consulted? Not Applicable   Coral Spikes, [place] 10/10/17 [phone]

## 2017-10-10 NOTE — ED Triage Notes (Signed)
Patient in today c/o right great toe pain since yesterday. No injury noted. Patient states very little swelling. Patient concerned about gout. He does not have a history of gout.

## 2017-10-10 NOTE — Discharge Instructions (Signed)
This is likely gout.  Medication as prescribed.  Be sure to follow up with your PCP.  Take care  Dr. Lacinda Axon

## 2017-11-20 DIAGNOSIS — C44629 Squamous cell carcinoma of skin of left upper limb, including shoulder: Secondary | ICD-10-CM | POA: Diagnosis not present

## 2017-11-20 DIAGNOSIS — L570 Actinic keratosis: Secondary | ICD-10-CM | POA: Diagnosis not present

## 2018-01-15 DIAGNOSIS — K921 Melena: Secondary | ICD-10-CM | POA: Diagnosis not present

## 2018-01-21 DIAGNOSIS — K921 Melena: Secondary | ICD-10-CM | POA: Diagnosis not present

## 2018-01-27 DIAGNOSIS — N183 Chronic kidney disease, stage 3 (moderate): Secondary | ICD-10-CM | POA: Diagnosis not present

## 2018-01-27 DIAGNOSIS — I129 Hypertensive chronic kidney disease with stage 1 through stage 4 chronic kidney disease, or unspecified chronic kidney disease: Secondary | ICD-10-CM | POA: Diagnosis not present

## 2018-01-27 DIAGNOSIS — N179 Acute kidney failure, unspecified: Secondary | ICD-10-CM | POA: Diagnosis not present

## 2018-01-27 DIAGNOSIS — E1122 Type 2 diabetes mellitus with diabetic chronic kidney disease: Secondary | ICD-10-CM | POA: Diagnosis not present

## 2018-01-28 DIAGNOSIS — L821 Other seborrheic keratosis: Secondary | ICD-10-CM | POA: Diagnosis not present

## 2018-01-28 DIAGNOSIS — Z08 Encounter for follow-up examination after completed treatment for malignant neoplasm: Secondary | ICD-10-CM | POA: Diagnosis not present

## 2018-01-28 DIAGNOSIS — L570 Actinic keratosis: Secondary | ICD-10-CM | POA: Diagnosis not present

## 2018-01-28 DIAGNOSIS — X32XXXA Exposure to sunlight, initial encounter: Secondary | ICD-10-CM | POA: Diagnosis not present

## 2018-01-28 DIAGNOSIS — Z85828 Personal history of other malignant neoplasm of skin: Secondary | ICD-10-CM | POA: Diagnosis not present

## 2018-01-30 DIAGNOSIS — I129 Hypertensive chronic kidney disease with stage 1 through stage 4 chronic kidney disease, or unspecified chronic kidney disease: Secondary | ICD-10-CM | POA: Diagnosis not present

## 2018-01-30 DIAGNOSIS — N183 Chronic kidney disease, stage 3 (moderate): Secondary | ICD-10-CM | POA: Diagnosis not present

## 2018-01-30 DIAGNOSIS — E1122 Type 2 diabetes mellitus with diabetic chronic kidney disease: Secondary | ICD-10-CM | POA: Diagnosis not present

## 2018-01-30 DIAGNOSIS — N2581 Secondary hyperparathyroidism of renal origin: Secondary | ICD-10-CM | POA: Diagnosis not present

## 2018-01-30 DIAGNOSIS — R809 Proteinuria, unspecified: Secondary | ICD-10-CM | POA: Diagnosis not present

## 2018-03-25 DIAGNOSIS — N183 Chronic kidney disease, stage 3 (moderate): Secondary | ICD-10-CM | POA: Diagnosis not present

## 2018-03-25 DIAGNOSIS — E7800 Pure hypercholesterolemia, unspecified: Secondary | ICD-10-CM | POA: Diagnosis not present

## 2018-03-25 DIAGNOSIS — R7303 Prediabetes: Secondary | ICD-10-CM | POA: Diagnosis not present

## 2018-03-25 DIAGNOSIS — D696 Thrombocytopenia, unspecified: Secondary | ICD-10-CM | POA: Diagnosis not present

## 2018-03-25 DIAGNOSIS — D638 Anemia in other chronic diseases classified elsewhere: Secondary | ICD-10-CM | POA: Diagnosis not present

## 2018-04-01 DIAGNOSIS — D696 Thrombocytopenia, unspecified: Secondary | ICD-10-CM | POA: Diagnosis not present

## 2018-04-01 DIAGNOSIS — D638 Anemia in other chronic diseases classified elsewhere: Secondary | ICD-10-CM | POA: Diagnosis not present

## 2018-04-01 DIAGNOSIS — R7303 Prediabetes: Secondary | ICD-10-CM | POA: Diagnosis not present

## 2018-04-01 DIAGNOSIS — E7800 Pure hypercholesterolemia, unspecified: Secondary | ICD-10-CM | POA: Diagnosis not present

## 2018-04-01 DIAGNOSIS — R0982 Postnasal drip: Secondary | ICD-10-CM | POA: Diagnosis not present

## 2018-04-01 DIAGNOSIS — I10 Essential (primary) hypertension: Secondary | ICD-10-CM | POA: Diagnosis not present

## 2018-04-01 DIAGNOSIS — N183 Chronic kidney disease, stage 3 (moderate): Secondary | ICD-10-CM | POA: Diagnosis not present

## 2018-05-08 DIAGNOSIS — L309 Dermatitis, unspecified: Secondary | ICD-10-CM | POA: Diagnosis not present

## 2018-07-09 ENCOUNTER — Observation Stay
Admission: EM | Admit: 2018-07-09 | Discharge: 2018-07-10 | Disposition: A | Discharge status: Home / Self Care | Payer: PPO | Attending: Internal Medicine | Admitting: Internal Medicine

## 2018-07-09 ENCOUNTER — Other Ambulatory Visit: Payer: Self-pay

## 2018-07-09 DIAGNOSIS — X58XXXA Exposure to other specified factors, initial encounter: Secondary | ICD-10-CM | POA: Diagnosis not present

## 2018-07-09 DIAGNOSIS — T464X5A Adverse effect of angiotensin-converting-enzyme inhibitors, initial encounter: Secondary | ICD-10-CM | POA: Diagnosis not present

## 2018-07-09 DIAGNOSIS — Z79899 Other long term (current) drug therapy: Secondary | ICD-10-CM | POA: Insufficient documentation

## 2018-07-09 DIAGNOSIS — I2510 Atherosclerotic heart disease of native coronary artery without angina pectoris: Secondary | ICD-10-CM | POA: Insufficient documentation

## 2018-07-09 DIAGNOSIS — Z03818 Encounter for observation for suspected exposure to other biological agents ruled out: Secondary | ICD-10-CM | POA: Diagnosis not present

## 2018-07-09 DIAGNOSIS — Z66 Do not resuscitate: Secondary | ICD-10-CM | POA: Insufficient documentation

## 2018-07-09 DIAGNOSIS — T783XXA Angioneurotic edema, initial encounter: Principal | ICD-10-CM | POA: Insufficient documentation

## 2018-07-09 DIAGNOSIS — N183 Chronic kidney disease, stage 3 (moderate): Secondary | ICD-10-CM | POA: Insufficient documentation

## 2018-07-09 DIAGNOSIS — Z7982 Long term (current) use of aspirin: Secondary | ICD-10-CM | POA: Insufficient documentation

## 2018-07-09 DIAGNOSIS — Z1159 Encounter for screening for other viral diseases: Secondary | ICD-10-CM | POA: Insufficient documentation

## 2018-07-09 DIAGNOSIS — Z8249 Family history of ischemic heart disease and other diseases of the circulatory system: Secondary | ICD-10-CM | POA: Diagnosis not present

## 2018-07-09 DIAGNOSIS — I10 Essential (primary) hypertension: Secondary | ICD-10-CM | POA: Diagnosis not present

## 2018-07-09 DIAGNOSIS — I129 Hypertensive chronic kidney disease with stage 1 through stage 4 chronic kidney disease, or unspecified chronic kidney disease: Secondary | ICD-10-CM | POA: Diagnosis not present

## 2018-07-09 DIAGNOSIS — M109 Gout, unspecified: Secondary | ICD-10-CM | POA: Insufficient documentation

## 2018-07-09 HISTORY — DX: Gout, unspecified: M10.9

## 2018-07-09 LAB — CBC
HCT: 39.8 % (ref 39.0–52.0)
Hemoglobin: 13.1 g/dL (ref 13.0–17.0)
MCH: 29.3 pg (ref 26.0–34.0)
MCHC: 32.9 g/dL (ref 30.0–36.0)
MCV: 89 fL (ref 80.0–100.0)
Platelets: 114 10*3/uL — ABNORMAL LOW (ref 150–400)
RBC: 4.47 MIL/uL (ref 4.22–5.81)
RDW: 15.8 % — ABNORMAL HIGH (ref 11.5–15.5)
WBC: 7.4 10*3/uL (ref 4.0–10.5)
nRBC: 0 % (ref 0.0–0.2)

## 2018-07-09 LAB — BASIC METABOLIC PANEL
Anion gap: 11 (ref 5–15)
BUN: 37 mg/dL — ABNORMAL HIGH (ref 8–23)
CO2: 20 mmol/L — ABNORMAL LOW (ref 22–32)
Calcium: 9.5 mg/dL (ref 8.9–10.3)
Chloride: 106 mmol/L (ref 98–111)
Creatinine, Ser: 1.92 mg/dL — ABNORMAL HIGH (ref 0.61–1.24)
GFR calc Af Amer: 35 mL/min — ABNORMAL LOW (ref >60)
GFR calc non Af Amer: 31 mL/min — ABNORMAL LOW (ref >60)
Glucose, Bld: 106 mg/dL — ABNORMAL HIGH (ref 70–99)
Potassium: 4.3 mmol/L (ref 3.5–5.1)
Sodium: 137 mmol/L (ref 135–145)

## 2018-07-09 LAB — SARS CORONAVIRUS 2 BY RT PCR (HOSPITAL ORDER, PERFORMED IN ~~LOC~~ HOSPITAL LAB): SARS Coronavirus 2: NEGATIVE

## 2018-07-09 MED ORDER — POLYETHYLENE GLYCOL 3350 17 G PO PACK: 17.0000 g | PACK | Freq: Every day | ORAL | Status: DC | PRN

## 2018-07-09 MED ORDER — ACETAMINOPHEN 650 MG RE SUPP: 650.0000 mg | Freq: Four times a day (QID) | RECTAL | Status: DC | PRN

## 2018-07-09 MED ORDER — FAMOTIDINE 20 MG PO TABS
10.0000 mg | ORAL_TABLET | Freq: Two times a day (BID) | ORAL | Status: DC
  Administered 2018-07-09 – 2018-07-10 (×3): 10 mg via ORAL
  Filled 2018-07-09 (×3): qty 1

## 2018-07-09 MED ORDER — ACETAMINOPHEN 325 MG PO TABS: 650.0000 mg | ORAL_TABLET | Freq: Four times a day (QID) | ORAL | Status: DC | PRN

## 2018-07-09 MED ORDER — ALBUTEROL SULFATE (2.5 MG/3ML) 0.083% IN NEBU: 2.5000 mg | INHALATION_SOLUTION | RESPIRATORY_TRACT | Status: DC | PRN

## 2018-07-09 MED ORDER — DIPHENHYDRAMINE HCL 50 MG/ML IJ SOLN
12.5000 mg | Freq: Once | INTRAMUSCULAR | Status: AC
  Administered 2018-07-09: 08:00:00 12.5 mg via INTRAVENOUS
  Filled 2018-07-09: qty 1

## 2018-07-09 MED ORDER — DIPHENHYDRAMINE HCL 25 MG PO CAPS
25.0000 mg | ORAL_CAPSULE | Freq: Three times a day (TID) | ORAL | Status: DC
  Administered 2018-07-09 – 2018-07-10 (×4): 25 mg via ORAL
  Filled 2018-07-09 (×4): qty 1

## 2018-07-09 MED ORDER — ENOXAPARIN SODIUM 30 MG/0.3ML ~~LOC~~ SOLN
30.0000 mg | SUBCUTANEOUS | Status: DC
  Filled 2018-07-09: qty 0.3

## 2018-07-09 MED ORDER — PREDNISONE 50 MG PO TABS
50.0000 mg | ORAL_TABLET | Freq: Every day | ORAL | Status: DC
  Administered 2018-07-10: 08:00:00 50 mg via ORAL
  Filled 2018-07-09 (×2): qty 1

## 2018-07-09 MED ORDER — FAMOTIDINE IN NACL 20-0.9 MG/50ML-% IV SOLN
20.0000 mg | Freq: Once | INTRAVENOUS | Status: AC
  Administered 2018-07-09: 08:00:00 20 mg via INTRAVENOUS
  Filled 2018-07-09: qty 50

## 2018-07-09 MED ORDER — METHYLPREDNISOLONE SODIUM SUCC 125 MG IJ SOLR
60.0000 mg | Freq: Once | INTRAMUSCULAR | Status: AC
  Administered 2018-07-09: 08:00:00 60 mg via INTRAVENOUS
  Filled 2018-07-09: qty 2

## 2018-07-09 MED ORDER — ONDANSETRON HCL 4 MG/2ML IJ SOLN: 4.0000 mg | Freq: Four times a day (QID) | INTRAMUSCULAR | Status: DC | PRN

## 2018-07-09 MED ORDER — DOCUSATE SODIUM 100 MG PO CAPS
100.0000 mg | ORAL_CAPSULE | Freq: Two times a day (BID) | ORAL | Status: DC
  Administered 2018-07-09 – 2018-07-10 (×3): 100 mg via ORAL
  Filled 2018-07-09 (×3): qty 1

## 2018-07-09 MED ORDER — COLCHICINE 0.6 MG PO TABS
0.6000 mg | ORAL_TABLET | Freq: Once | ORAL | Status: AC
  Administered 2018-07-09: 11:00:00 0.6 mg via ORAL
  Filled 2018-07-09: qty 1

## 2018-07-09 MED ORDER — ONDANSETRON HCL 4 MG PO TABS: 4.0000 mg | ORAL_TABLET | Freq: Four times a day (QID) | ORAL | Status: DC | PRN

## 2018-07-09 NOTE — H&P (Signed)
[place] [facility] [place] NAME: Aaron Burton    MR#:  [id number]  DATE OF BIRTH:  Sep 24, 1928  DATE OF ADMISSION:  07/09/2018  PRIMARY CARE PHYSICIAN: Dion Body, MD   REQUESTING/REFERRING PHYSICIAN: dr Joni Fears  CHIEF COMPLAINT:   Left facial swelling including lip and cheek at 4 AM HISTORY OF PRESENT ILLNESS:  Aaron Burton  is a 85 y.o. male with a known history of CAD, gout, hypertension comes to the emergency room with complaints of left facial swelling mainly of the lip and the cheek when he notices at 4 AM. Patient has been having some issues with his gout in the foot. He is been taking couple ibuprofen's for last few days. He does take lisinopril 2.5 mg every night for couple of years. Denies any issues with lisinopril before or any new medication other than ibuprofen.  She was found to have angioedema. He is hemodynamically stable. Able to swallow. No respiratory distress. He is being admitted for overnight observation. He received Solu-Medrol, Benadryl, famotidine in the ER  I spoke with patient's son Aaron Burton on the phone.  PAST MEDICAL HISTORY:   Past Medical History:  Diagnosis Date  . Coronary artery disease   . Gout   . Hypertension     PAST SURGICAL HISTOIRY:   Past Surgical History:  Procedure Laterality Date  . ACHILLES TENDON REPAIR    . CAROTID ARTERY ANGIOPLASTY    . COLON SURGERY    . LEG SURGERY    . LITHOTRIPSY      SOCIAL HISTORY:   Social History   Tobacco Use  . Smoking status: Never Smoker  . Smokeless tobacco: Never Used  Substance Use Topics  . Alcohol use: No    FAMILY HISTORY:   Family History  Problem Relation Age of Onset  . Stroke Mother   . Hypertension Father   . Heart disease Father     DRUG ALLERGIES:   Allergies  Allergen Reactions  . Ace Inhibitors     Facial swelling    REVIEW OF SYSTEMS:  Review of Systems  Constitutional: Negative for chills, fever  and weight loss.  HENT: Negative for ear discharge, ear pain and nosebleeds.        Swelling of the left cheek and lips  Eyes: Negative for blurred vision, pain and discharge.  Respiratory: Negative for sputum production, shortness of breath, wheezing and stridor.   Cardiovascular: Negative for chest pain, palpitations, orthopnea and PND.  Gastrointestinal: Negative for abdominal pain, diarrhea, nausea and vomiting.  Genitourinary: Negative for frequency and urgency.  Musculoskeletal: Negative for back pain and joint pain.  Neurological: Negative for sensory change, speech change, focal weakness and weakness.  Psychiatric/Behavioral: Negative for depression and hallucinations. The patient is not nervous/anxious.      MEDICATIONS AT HOME:   Prior to Admission medications   Medication Sig Start Date End Date Taking? Authorizing Provider  amLODipine (NORVASC) 5 MG tablet Take 1 tablet by mouth daily. 02/18/17   [provider]  aspirin EC 81 MG tablet Take 1 tablet by mouth daily.    [provider]  Cholecalciferol (VITAMIN D-1000 MAX ST) 1000 units tablet Take 1 tablet by mouth 2 (two) times a week.    [provider]  lisinopril (PRINIVIL,ZESTRIL) 2.5 MG tablet Take 1 tablet by mouth daily.    [provider]  Multiple Vitamin (MULTI-VITAMINS) TABS Take 1 tablet by mouth daily.    [provider]  simvastatin (ZOCOR) 20 MG tablet Take 1 tablet by mouth daily. 10/01/17   [provider]      VITAL SIGNS:  Blood pressure (!) 145/87, pulse 78, temperature 98.1 F (36.7 C), temperature source Oral, resp. rate 17, height 5\' 6"  (1.676 m), weight 79.4 kg, SpO2 96 %.  PHYSICAL EXAMINATION:  GENERAL:  85 y.o.-year-old patient lying in the bed with no acute distress.  EYES: Pupils equal, round, reactive to light and accommodation. No scleral icterus. Extraocular muscles intact.  HEENT: Head atraumatic, normocephalic. Swelling of the left side  of the cheek and lips NECK:  Supple, no jugular venous distention. No thyroid enlargement, no tenderness.  LUNGS: Normal breath sounds bilaterally, no wheezing, rales,rhonchi or crepitation. No use of accessory muscles of respiration.  CARDIOVASCULAR: S1, S2 normal. No murmurs, rubs, or gallops.  ABDOMEN: Soft, nontender, nondistended. Bowel sounds present. No organomegaly or mass.  EXTREMITIES: No pedal edema, cyanosis, or clubbing.  NEUROLOGIC: Cranial nerves II through XII are intact. Muscle strength 5/5 in all extremities. Sensation intact. Gait not checked.  PSYCHIATRIC: The patient is alert and oriented x 3.  SKIN: No obvious rash, lesion, or ulcer.   LABORATORY PANEL:   CBC No results for input(s): WBC, HGB, HCT, PLT in the last 168 hours. ------------------------------------------------------------------------------------------------------------------  Chemistries  No results for input(s): NA, K, CL, CO2, GLUCOSE, BUN, CREATININE, CALCIUM, MG, AST, ALT, ALKPHOS, BILITOT in the last 168 hours.  Invalid input(s): GFRCGP ------------------------------------------------------------------------------------------------------------------  Cardiac Enzymes No results for input(s): TROPONINI in the last 168 hours. ------------------------------------------------------------------------------------------------------------------  RADIOLOGY:  No results found.  EKG:    IMPRESSION AND PLAN:   Aaron Burton  is a 85 y.o. male with a known history of CAD, gout, hypertension comes to the emergency room with complaints of left facial swelling mainly of the lip and the cheek when he notices at 4 AM.  1. Angioedema suspected due to lisinopril -for overnight observation -prednisone, Benadryl, famotidine -watch for respiratory distress -ACE inhibitor listed in allergies  2. Hypertension continue amlodipine  3. History of gout -patient received colchicine in the ER  4. DVT prophylaxis  Lovenox   All the records are reviewed and case discussed with ED provider.   CODE STATUS: DNR  TOTAL TIME TAKING CARE OF THIS PATIENT: **45* minutes.    Fritzi Mandes M.D on 07/09/2018 at 10:33 AM  Between 7am to 6pm - Pager - [phone]  After 6pm go to [email or web address] - password [email or web address] [facility]  [facility]  Office  [phone]  CC: Primary care physician; Dion Body, MD

## 2018-07-09 NOTE — ED Notes (Signed)
ED TO INPATIENT HANDOFF REPORT  ED Nurse Name and Phone #: bill [phone]   S Name/Age/Gender Aaron Burton 85 y.o. male Room/Bed: ED07A/ED07A  Code Status   Code Status: Not on file  Home/SNF/Other Home Patient oriented to: self, place, time and situation Is this baseline? Yes   Triage Complete: Triage complete  Chief Complaint facial swelling  Triage Note Pt states he had a gout flare up and this morning around 2am he took some advil and around 4am he noticed some facial swelling, when he got up this morning the whole left side of his face, top and bottom lip are swollen with mild tongue swelling noted. Denies any difficulty swallowing or breathing at this time.   Allergies Allergies  Allergen Reactions  . Ace Inhibitors     Facial swelling    Level of Care/Admitting Diagnosis ED Disposition    ED Disposition Condition [facility] Area: [place] [[id number]]  Level of Care: Med-Surg [16]  Covid Evaluation: N/A  Diagnosis: Angioedema [557322]  Admitting Physician: Odessa Fleming  Attending Physician: Fritzi Mandes [[id number]]  PT Class (Do Not Modify): Observation [104]  PT Acc Code (Do Not Modify): Observation [10022]       B Medical/Surgery History Past Medical History:  Diagnosis Date  . Coronary artery disease   . Gout   . Hypertension    Past Surgical History:  Procedure Laterality Date  . ACHILLES TENDON REPAIR    . CAROTID ARTERY ANGIOPLASTY    . COLON SURGERY    . LEG SURGERY    . LITHOTRIPSY       A IV Location/Drains/Wounds Patient Lines/Drains/Airways Status   Active Line/Drains/Airways    Name:   Placement date:   Placement time:   Site:   Days:   Peripheral IV 07/09/18 Left Hand   07/09/18    0741    Hand   less than 1          Intake/Output Last 24 hours No intake or output data in the 24 hours ending 07/09/18 1036  Labs/Imaging No results found for this or any previous visit (from the past 12  hour(s)). No results found.  Pending Labs Unresulted Labs (From admission, onward)    Start     Ordered   07/09/18 0254  Basic metabolic panel  ONCE - STAT,   STAT     07/09/18 0942   Signed and Held  CBC  (enoxaparin (LOVENOX)    CrCl >/= 30 ml/min)  Once,   R    Comments:  Baseline for enoxaparin therapy IF NOT ALREADY DRAWN.  Notify MD if PLT < 100 K.    Signed and Held   Signed and Held  Creatinine, serum  (enoxaparin (LOVENOX)    CrCl >/= 30 ml/min)  Once,   R    Comments:  Baseline for enoxaparin therapy IF NOT ALREADY DRAWN.    Signed and Held   Signed and Held  Creatinine, serum  (enoxaparin (LOVENOX)    CrCl >/= 30 ml/min)  Weekly,   R    Comments:  while on enoxaparin therapy    Signed and Held          Vitals/Pain Today's Vitals   07/09/18 0945 07/09/18 1000 07/09/18 1015 07/09/18 1030  BP:    136/63  Pulse: 65 75 83 62  Resp:      Temp:      TempSrc:      SpO2:  97% 97% 98% 97%  Weight:      Height:      PainSc:        Isolation Precautions No active isolations  Medications Medications  methylPREDNISolone sodium succinate (SOLU-MEDROL) 125 mg/2 mL injection 60 mg (60 mg Intravenous Given 07/09/18 0807)  diphenhydrAMINE (BENADRYL) injection 12.5 mg (12.5 mg Intravenous Given 07/09/18 0808)  famotidine (PEPCID) IVPB 20 mg premix (0 mg Intravenous Stopped 07/09/18 0841)  colchicine tablet 0.6 mg (0.6 mg Oral Given 07/09/18 1034)    Mobility walks Low fall risk   Focused Assessments edema   R Recommendations: See Admitting Provider Note  Report given to:   Additional Notes:

## 2018-07-09 NOTE — ED Provider Notes (Signed)
[facility] Emergency Department Provider Note  ____________________________________________  Time seen: Approximately 9:29 AM  I have reviewed the triage vital signs and the nursing notes.   HISTORY  Chief Complaint Facial Swelling    HPI Aaron Burton is a 85 y.o. male with a history of CAD gout hypertension who comes the ED complaining of swelling of the left side of his face in the upper lip and cheek.  He noticed that when he woke up at about 4 AM this morning.  Is been gradually getting worse since then.  He denies chest pain or shortness of breath.  He does take lisinopril 2.5 mg every night which he has been on continuously for the past couple of years at least.   He has been taking ibuprofen frequently over the past 2 weeks due to a gout flare in his left first MTP joint which he attributes to increased red meat consumption.  Denies any Kroeze or bloody stool, abdominal pain or vomiting.    Past Medical History:  Diagnosis Date  . Coronary artery disease   . Gout   . Hypertension      There are no active problems to display for this patient.    Past Surgical History:  Procedure Laterality Date  . ACHILLES TENDON REPAIR    . CAROTID ARTERY ANGIOPLASTY    . COLON SURGERY    . LEG SURGERY    . LITHOTRIPSY       Prior to Admission medications   Medication Sig Start Date End Date Taking? Authorizing Provider  amLODipine (NORVASC) 5 MG tablet Take 1 tablet by mouth daily. 02/18/17   [provider]  aspirin EC 81 MG tablet Take 1 tablet by mouth daily.    [provider]  Cholecalciferol (VITAMIN D-1000 MAX ST) 1000 units tablet Take 1 tablet by mouth 2 (two) times a week.    [provider]  lisinopril (PRINIVIL,ZESTRIL) 2.5 MG tablet Take 1 tablet by mouth daily.    [provider]  Multiple Vitamin (MULTI-VITAMINS) TABS Take 1 tablet by mouth daily.    [provider]  simvastatin (ZOCOR) 20 MG  tablet Take 1 tablet by mouth daily. 10/01/17   [provider]     Allergies Patient has no known allergies.   Family History  Problem Relation Age of Onset  . Stroke Mother   . Hypertension Father   . Heart disease Father     Social History Social History   Tobacco Use  . Smoking status: Never Smoker  . Smokeless tobacco: Never Used  Substance Use Topics  . Alcohol use: No  . Drug use: No    Review of Systems  Constitutional:   No fever or chills.  ENT:   No sore throat. No rhinorrhea. Cardiovascular:   No chest pain or syncope. Respiratory:   No dyspnea or cough. Gastrointestinal:   Negative for abdominal pain, vomiting and diarrhea.  Musculoskeletal:   Negative for focal pain or swelling All other systems reviewed and are negative except as documented above in ROS and HPI.  ____________________________________________   PHYSICAL EXAM:  VITAL SIGNS: ED Triage Vitals  Enc Vitals Group     BP 07/09/18 0722 (!) 145/87     Pulse Rate 07/09/18 0722 78     Resp 07/09/18 0722 17     Temp 07/09/18 0722 98.1 F (36.7 C)     Temp Source 07/09/18 0722 Oral     SpO2 07/09/18 0722 96 %  Weight 07/09/18 0723 175 lb (79.4 kg)     Height 07/09/18 0723 5\' 6"  (1.676 m)     Head Circumference --      Peak Flow --      Pain Score 07/09/18 0723 0     Pain Loc --      Pain Edu? --      Excl. in [place]? --     Vital signs reviewed, nursing assessments reviewed.   Constitutional:   Alert and oriented. Non-toxic appearance. Eyes:   Conjunctivae are normal. EOMI. PERRL. ENT      Head:   Normocephalic and atraumatic.  Edema of the left upper lip and left maxillary cheek.  Soft.  No inflammatory changes.      Nose:   No congestion/rhinnorhea.       Mouth/Throat: Left upper lip edematous and soft.  MMM, no pharyngeal erythema. No peritonsillar mass.  No edema of the tongue, no tongue elevation, floor of mouth soft.  No inflammatory changes in the mouth.  No uvula  edema.      Neck:   No meningismus. Full ROM. Hematological/Lymphatic/Immunilogical:   No cervical lymphadenopathy. Cardiovascular:   RRR. Symmetric bilateral radial and DP pulses.  No murmurs. Cap refill less than 2 seconds. Respiratory:   Normal respiratory effort without tachypnea/retractions. Breath sounds are clear and equal bilaterally. No wheezes/rales/rhonchi. Gastrointestinal:   Soft and nontender. Non distended. There is no CVA tenderness.  No rebound, rigidity, or guarding. Musculoskeletal:   Normal range of motion in all extremities. No joint effusions.  No lower extremity tenderness.  No edema. Neurologic:   Normal speech and language.  Motor grossly intact. No acute focal neurologic deficits are appreciated.  Skin:    Skin is warm, dry and intact. No rash noted.  No petechiae, purpura, or bullae.  ____________________________________________    LABS (pertinent positives/negatives) (all labs ordered are listed, but only abnormal results are displayed) Labs Reviewed - No data to display ____________________________________________   EKG    ____________________________________________    RADIOLOGY  No results found.  ____________________________________________   PROCEDURES Procedures  ____________________________________________    CLINICAL IMPRESSION / ASSESSMENT AND PLAN / ED COURSE  Medications ordered in the ED: Medications  colchicine tablet 0.6 mg (has no administration in time range)  methylPREDNISolone sodium succinate (SOLU-MEDROL) 125 mg/2 mL injection 60 mg (60 mg Intravenous Given 07/09/18 0807)  diphenhydrAMINE (BENADRYL) injection 12.5 mg (12.5 mg Intravenous Given 07/09/18 0808)  famotidine (PEPCID) IVPB 20 mg premix (20 mg Intravenous New Bag/Given 07/09/18 1761)    Pertinent labs & imaging results that were available during my care of the patient were reviewed by me and considered in my medical decision making (see chart for  details).  Aaron Burton was evaluated in Emergency Department on 07/09/2018 for the symptoms described in the history of present illness. He was evaluated in the context of the global COVID-19 pandemic, which necessitated consideration that the patient might be at risk for infection with the SARS-CoV-2 virus that causes COVID-19. Institutional protocols and algorithms that pertain to the evaluation of patients at risk for COVID-19 are in a state of rapid change based on information released by regulatory bodies including the CDC and federal and state organizations. These policies and algorithms were followed during the patient's care in the ED.   Patient presents with angioedema of left upper lip and left cheek in the setting of chronic lisinopril use and frequent ibuprofen use over the last 2 weeks.  The  ibuprofen may be inhibiting renal excretion of the lisinopril causing a gradual increasing serum level of lisinopril which is now reached the point of provoking angioedema.  This does not look like anaphylaxis or allergic reaction to ibuprofen.  No other new medications or exposures.  Currently no airway compromise.  Will give patient a weight-based dose of Solu-Medrol, Benadryl and Pepcid and reassess.  I will give the patient a dose of colchicine for his gout which should not interact.  Otherwise we will hold NSAIDs.  ----------------------------------------- 9:33 AM on 07/09/2018 -----------------------------------------  Edema worsened, now involving bilateral upper lip and more prominent.  Still no swelling in the mouth or tongue, breathing comfortably.  Since symptoms are still worsening, now almost 6 hours since onset, he will need to be observed in the hospital to ensure that he does not suffer any airway compromise before it improves.  Discussed with hospitalist Dr. Manuella Ghazi.      ____________________________________________   FINAL CLINICAL IMPRESSION(S) / ED DIAGNOSES    Final  diagnoses:  Angioedema due to angiotensin converting enzyme inhibitor (ACE-I)     ED Discharge Orders    None      Portions of this note were generated with dragon dictation software. Dictation errors may occur despite best attempts at proofreading.   Carrie Mew, MD 07/09/18 [phone]

## 2018-07-09 NOTE — ED Triage Notes (Signed)
Pt states he had a gout flare up and this morning around 2am he took some advil and around 4am he noticed some facial swelling, when he got up this morning the whole left side of his face, top and bottom lip are swollen with mild tongue swelling noted. Denies any difficulty swallowing or breathing at this time.

## 2018-07-09 NOTE — Progress Notes (Addendum)
Family Meeting Note  Advance Directive:yes  Today a meeting took place with the patient in the ER  Patient is being admitted for angioedema suspected due to lisinopril. He came in with swelling of his left side of the face and lips. Hemodynamically stable. A dress code status. Patient is a living will. He is a DNR. Time spent 16 minutes Fritzi Mandes, MD

## 2018-07-09 NOTE — ED Notes (Signed)
Pt with left facial swelling. Able to talk, breath without distress.

## 2018-07-10 DIAGNOSIS — T783XXA Angioneurotic edema, initial encounter: Secondary | ICD-10-CM | POA: Diagnosis not present

## 2018-07-10 DIAGNOSIS — I10 Essential (primary) hypertension: Secondary | ICD-10-CM | POA: Diagnosis not present

## 2018-07-10 MED ORDER — PREDNISONE 10 MG PO TABS: ORAL_TABLET | ORAL | 0 refills | Status: DC

## 2018-07-10 MED ORDER — COLCHICINE 0.6 MG PO TABS
0.6000 mg | ORAL_TABLET | Freq: Every day | ORAL | Status: DC | PRN
  Filled 2018-07-10: qty 1

## 2018-07-10 MED ORDER — DIPHENHYDRAMINE HCL 25 MG PO CAPS: 25.0000 mg | ORAL_CAPSULE | Freq: Three times a day (TID) | ORAL | 0 refills | Status: AC

## 2018-07-10 MED ORDER — COLCHICINE 0.6 MG PO TABS: 0.6000 mg | ORAL_TABLET | Freq: Every day | ORAL | 0 refills | Status: DC | PRN

## 2018-07-10 MED ORDER — FAMOTIDINE 10 MG PO TABS: 10.0000 mg | ORAL_TABLET | Freq: Two times a day (BID) | ORAL | 0 refills | Status: AC

## 2018-07-10 NOTE — Progress Notes (Signed)
Received Md order to discharge patient to home, reviewed home meds, discharge instructions follow up appointments and prescriptions with patient and  patient vebalized understanding

## 2018-07-10 NOTE — Discharge Instructions (Signed)
Return to the ER if your facial swelling gets worse or you have increase trouble with breathing

## 2018-07-10 NOTE — Discharge Summary (Signed)
[facility] [place] NAME: Aaron Burton    MR#:  [id number]  DATE OF BIRTH:  10-29-1928  DATE OF ADMISSION:  07/09/2018 ADMITTING PHYSICIAN: Fritzi Mandes, MD  DATE OF DISCHARGE: 07/10/2018  PRIMARY CARE PHYSICIAN: Dion Body, MD    ADMISSION DIAGNOSIS:  Angioedema due to angiotensin converting enzyme inhibitor (ACE-I) [T78.3XXA, T46.4X5A]  DISCHARGE DIAGNOSIS:  Angioedema  SECONDARY DIAGNOSIS:   Past Medical History:  Diagnosis Date  . Coronary artery disease   . Gout   . Hypertension     HOSPITAL COURSE:   Aaron Burton  is a 85 y.o. male with a known history of CAD, gout, hypertension comes to the emergency room with complaints of left facial swelling mainly of the lip and the cheek when he notices at 4 AM.  1. Angioedema suspected due to lisinopril -prednisone, Benadryl, famotidine -watch for respiratory distress -ACE inhibitor listed in allergies -pt feeling better  2. Hypertension continue amlodipine  3. History of gout with mild flare left great toe -patient received colchicine in the ER will give rx for prn dosing and prednisone should help also  4. DVT prophylaxis Lovenox  5. CKD-III--follows with Dr Juleen China   Overall doing well. D/c home. Pt agreeable  CONSULTS OBTAINED:    DRUG ALLERGIES:   Allergies  Allergen Reactions  . Ace Inhibitors Swelling    Facial swelling    DISCHARGE MEDICATIONS:   Allergies as of 07/10/2018      Reactions   Ace Inhibitors Swelling   Facial swelling      Medication List    STOP taking these medications   lisinopril 2.5 MG tablet Commonly known as:  ZESTRIL     TAKE these medications   amLODipine 5 MG tablet Commonly known as:  NORVASC Take 1 tablet by mouth daily.   aspirin EC 81 MG tablet Take 1 tablet by mouth daily.   Cholecalciferol 125 MCG (5000 UT) Tabs Take 5,000 Units by mouth 2 (two) times a week.   colchicine 0.6 MG tablet Take  1 tablet (0.6 mg total) by mouth daily as needed (gout).   diphenhydrAMINE 25 mg capsule Commonly known as:  BENADRYL Take 1 capsule (25 mg total) by mouth 3 (three) times daily for 5 days.   famotidine 10 MG tablet Commonly known as:  PEPCID Take 1 tablet (10 mg total) by mouth 2 (two) times daily.   Multi-Vitamins Tabs Take 1 tablet by mouth daily.   predniSONE 10 MG tablet Commonly known as:  DELTASONE Take 50 mg daily --taper by 10 mg daily then stop   simvastatin 20 MG tablet Commonly known as:  ZOCOR Take 20 mg by mouth every evening.   Vegetable Lax+Stool Softener 8.6-50 MG tablet Generic drug:  senna-docusate Take 0.5 tablets by mouth every evening.       If you experience worsening of your admission symptoms, develop shortness of breath, life threatening emergency, suicidal or homicidal thoughts you must seek medical attention immediately by calling 911 or calling your MD immediately  if symptoms less severe.  You Must read complete instructions/literature along with all the possible adverse reactions/side effects for all the Medicines you take and that have been prescribed to you. Take any new Medicines after you have completely understood and accept all the possible adverse reactions/side effects.   Please note  You were cared for by a hospitalist during your hospital stay. If you have any questions about your discharge medications or the  care you received while you were in the hospital after you are discharged, you can call the unit and asked to speak with the hospitalist on call if the hospitalist that took care of you is not available. Once you are discharged, your primary care physician will handle any further medical issues. Please note that NO REFILLS for any discharge medications will be authorized once you are discharged, as it is imperative that you return to your primary care physician (or establish a relationship with a primary care physician if you do not have  one) for your aftercare needs so that they can reassess your need for medications and monitor your lab values. Today   SUBJECTIVE   Feels better Some right cheek puffiness. Lip swelling subsided. No trouble swallowing  VITAL SIGNS:  Blood pressure 109/74, pulse 68, temperature 97.7 F (36.5 C), temperature source Oral, resp. rate 18, height 5\' 6"  (1.676 m), weight 76.2 kg, SpO2 96 %.  I/O:    Intake/Output Summary (Last 24 hours) at 07/10/2018 0758 Last data filed at 07/09/2018 1700 Gross per 24 hour  Intake 240 ml  Output -  Net 240 ml    PHYSICAL EXAMINATION:  GENERAL:  85 y.o.-year-old patient lying in the bed with no acute distress.  EYES: Pupils equal, round, reactive to light and accommodation. No scleral icterus. Extraocular muscles intact.  HEENT: Head atraumatic, normocephalic. Oropharynx and nasopharynx clear. Bilateral cheek puffiness. Lip swelling improved remarkably NECK:  Supple, no jugular venous distention. No thyroid enlargement, no tenderness.  LUNGS: Normal breath sounds bilaterally, no wheezing, rales,rhonchi or crepitation. No use of accessory muscles of respiration.  CARDIOVASCULAR: S1, S2 normal. No murmurs, rubs, or gallops.  ABDOMEN: Soft, non-tender, non-distended. Bowel sounds present. No organomegaly or mass.  EXTREMITIES: No pedal edema, cyanosis, or clubbing.  NEUROLOGIC: Cranial nerves II through XII are intact. Muscle strength 5/5 in all extremities. Sensation intact. Gait not checked.  PSYCHIATRIC: The patient is alert and oriented x 3.  SKIN: No obvious rash, lesion, or ulcer.   DATA REVIEW:   CBC  Recent Labs  Lab 07/09/18 0744  WBC 7.4  HGB 13.1  HCT 39.8  PLT 114*    Chemistries  Recent Labs  Lab 07/09/18 0744  NA 137  K 4.3  CL 106  CO2 20*  GLUCOSE 106*  BUN 37*  CREATININE 1.92*  CALCIUM 9.5    Microbiology Results   Recent Results (from the past 240 hour(s))  SARS Coronavirus 2 (CEPHEID - Performed in [place]  hospital lab), Hosp Order     Status: None   Collection Time: 07/09/18 11:14 AM  Result Value Ref Range Status   SARS Coronavirus 2 NEGATIVE NEGATIVE Final    Comment: (NOTE) If result is NEGATIVE SARS-CoV-2 target nucleic acids are NOT DETECTED. The SARS-CoV-2 RNA is generally detectable in upper and lower  respiratory specimens during the acute phase of infection. The lowest  concentration of SARS-CoV-2 viral copies this assay can detect is 250  copies / mL. A negative result does not preclude SARS-CoV-2 infection  and should not be used as the sole basis for treatment or other  patient management decisions.  A negative result may occur with  improper specimen collection / handling, submission of specimen other  than nasopharyngeal swab, presence of viral mutation(s) within the  areas targeted by this assay, and inadequate number of viral copies  (<250 copies / mL). A negative result must be combined with clinical  observations, patient history, and epidemiological  information. If result is POSITIVE SARS-CoV-2 target nucleic acids are DETECTED. The SARS-CoV-2 RNA is generally detectable in upper and lower  respiratory specimens dur ing the acute phase of infection.  Positive  results are indicative of active infection with SARS-CoV-2.  Clinical  correlation with patient history and other diagnostic information is  necessary to determine patient infection status.  Positive results do  not rule out bacterial infection or co-infection with other viruses. If result is PRESUMPTIVE POSTIVE SARS-CoV-2 nucleic acids MAY BE PRESENT.   A presumptive positive result was obtained on the submitted specimen  and confirmed on repeat testing.  While 2019 novel coronavirus  (SARS-CoV-2) nucleic acids may be present in the submitted sample  additional confirmatory testing may be necessary for epidemiological  and / or clinical management purposes  to differentiate between  SARS-CoV-2 and other  Sarbecovirus currently known to infect humans.  If clinically indicated additional testing with an alternate test  methodology [phone]) is advised. The SARS-CoV-2 RNA is generally  detectable in upper and lower respiratory sp ecimens during the acute  phase of infection. The expected result is Negative. Fact Sheet for Patients:  StrictlyIdeas.no Fact Sheet for Healthcare Providers: BankingDealers.co.za This test is not yet approved or cleared by the [place] FDA and has been authorized for detection and/or diagnosis of SARS-CoV-2 by FDA under an Emergency Use Authorization (EUA).  This EUA will remain in effect (meaning this test can be used) for the duration of the COVID-19 declaration under Section 564(b)(1) of the Act, 21 U.S.C. section 360bbb-3(b)(1), unless the authorization is terminated or revoked sooner. Performed at [facility], [address] [place],  [address]     RADIOLOGY:  No results found.   CODE STATUS:     Code Status Orders  (From admission, onward)         Start     Ordered   07/10/18 0601  Do not attempt resuscitation (DNR)  Continuous    Question Answer Comment  In the event of cardiac or respiratory ARREST Do not call a "code blue"   In the event of cardiac or respiratory ARREST Do not perform Intubation, CPR, defibrillation or ACLS   In the event of cardiac or respiratory ARREST Use medication by any route, position, wound care, and other measures to relive pain and suffering. May use oxygen, suction and manual treatment of airway obstruction as needed for comfort.      07/10/18 0600        Code Status History    Date Active Date Inactive Code Status Order ID Comments User Context   07/10/2018 0600 07/10/2018 0600 Full Code [id number]  Harrie Foreman, MD Inpatient   07/09/2018 1251 07/10/2018 0600 Full Code [id number]  Fritzi Mandes, MD Inpatient    Advance Directive  Documentation     Most Recent Value  Type of Advance Directive  Living will, Out of facility DNR (pink MOST or yellow form)  Pre-existing out of facility DNR order (yellow form or pink MOST form)  -  "MOST" Form in Place?  -      TOTAL TIME TAKING CARE OF THIS PATIENT: *40* minutes.    Fritzi Mandes M.D on 07/10/2018 at 7:58 AM  Between 7am to 6pm - Pager - [phone] After 6pm go to [email or web address] - password [email or web address] [facility]  Office  [phone]  CC: Primary care physician; Dion Body, MD

## 2018-07-17 DIAGNOSIS — I10 Essential (primary) hypertension: Secondary | ICD-10-CM | POA: Diagnosis not present

## 2018-07-17 DIAGNOSIS — Z87898 Personal history of other specified conditions: Secondary | ICD-10-CM | POA: Diagnosis not present

## 2018-07-30 DIAGNOSIS — L570 Actinic keratosis: Secondary | ICD-10-CM | POA: Diagnosis not present

## 2018-07-30 DIAGNOSIS — X32XXXA Exposure to sunlight, initial encounter: Secondary | ICD-10-CM | POA: Diagnosis not present

## 2018-07-30 DIAGNOSIS — D2261 Melanocytic nevi of right upper limb, including shoulder: Secondary | ICD-10-CM | POA: Diagnosis not present

## 2018-07-30 DIAGNOSIS — Z85828 Personal history of other malignant neoplasm of skin: Secondary | ICD-10-CM | POA: Diagnosis not present

## 2018-07-30 DIAGNOSIS — D2272 Melanocytic nevi of left lower limb, including hip: Secondary | ICD-10-CM | POA: Diagnosis not present

## 2018-07-30 DIAGNOSIS — Z08 Encounter for follow-up examination after completed treatment for malignant neoplasm: Secondary | ICD-10-CM | POA: Diagnosis not present

## 2018-07-30 DIAGNOSIS — D2271 Melanocytic nevi of right lower limb, including hip: Secondary | ICD-10-CM | POA: Diagnosis not present

## 2018-07-30 DIAGNOSIS — L821 Other seborrheic keratosis: Secondary | ICD-10-CM | POA: Diagnosis not present

## 2018-07-30 DIAGNOSIS — D2262 Melanocytic nevi of left upper limb, including shoulder: Secondary | ICD-10-CM | POA: Diagnosis not present

## 2018-07-30 DIAGNOSIS — D225 Melanocytic nevi of trunk: Secondary | ICD-10-CM | POA: Diagnosis not present

## 2018-07-31 DIAGNOSIS — I129 Hypertensive chronic kidney disease with stage 1 through stage 4 chronic kidney disease, or unspecified chronic kidney disease: Secondary | ICD-10-CM | POA: Diagnosis not present

## 2018-07-31 DIAGNOSIS — E875 Hyperkalemia: Secondary | ICD-10-CM | POA: Diagnosis not present

## 2018-07-31 DIAGNOSIS — N183 Chronic kidney disease, stage 3 (moderate): Secondary | ICD-10-CM | POA: Diagnosis not present

## 2018-07-31 DIAGNOSIS — N179 Acute kidney failure, unspecified: Secondary | ICD-10-CM | POA: Diagnosis not present

## 2018-08-06 DIAGNOSIS — M109 Gout, unspecified: Secondary | ICD-10-CM | POA: Diagnosis not present

## 2018-08-06 DIAGNOSIS — N183 Chronic kidney disease, stage 3 (moderate): Secondary | ICD-10-CM | POA: Diagnosis not present

## 2018-08-06 DIAGNOSIS — I129 Hypertensive chronic kidney disease with stage 1 through stage 4 chronic kidney disease, or unspecified chronic kidney disease: Secondary | ICD-10-CM | POA: Diagnosis not present

## 2018-08-06 DIAGNOSIS — E1122 Type 2 diabetes mellitus with diabetic chronic kidney disease: Secondary | ICD-10-CM | POA: Diagnosis not present

## 2018-09-23 DIAGNOSIS — I10 Essential (primary) hypertension: Secondary | ICD-10-CM | POA: Diagnosis not present

## 2018-09-23 DIAGNOSIS — E7800 Pure hypercholesterolemia, unspecified: Secondary | ICD-10-CM | POA: Diagnosis not present

## 2018-09-23 DIAGNOSIS — R7303 Prediabetes: Secondary | ICD-10-CM | POA: Diagnosis not present

## 2018-09-23 DIAGNOSIS — D638 Anemia in other chronic diseases classified elsewhere: Secondary | ICD-10-CM | POA: Diagnosis not present

## 2018-09-23 DIAGNOSIS — N183 Chronic kidney disease, stage 3 (moderate): Secondary | ICD-10-CM | POA: Diagnosis not present

## 2018-09-30 DIAGNOSIS — E7800 Pure hypercholesterolemia, unspecified: Secondary | ICD-10-CM | POA: Diagnosis not present

## 2018-09-30 DIAGNOSIS — D638 Anemia in other chronic diseases classified elsewhere: Secondary | ICD-10-CM | POA: Diagnosis not present

## 2018-09-30 DIAGNOSIS — R7303 Prediabetes: Secondary | ICD-10-CM | POA: Diagnosis not present

## 2018-09-30 DIAGNOSIS — Z0000 Encounter for general adult medical examination without abnormal findings: Secondary | ICD-10-CM | POA: Diagnosis not present

## 2018-09-30 DIAGNOSIS — I10 Essential (primary) hypertension: Secondary | ICD-10-CM | POA: Diagnosis not present

## 2018-09-30 DIAGNOSIS — D696 Thrombocytopenia, unspecified: Secondary | ICD-10-CM | POA: Diagnosis not present

## 2018-10-05 ENCOUNTER — Emergency Department: Payer: PPO

## 2018-10-05 ENCOUNTER — Encounter: Payer: Self-pay | Admitting: Emergency Medicine

## 2018-10-05 ENCOUNTER — Emergency Department
Admission: EM | Admit: 2018-10-05 | Discharge: 2018-10-05 | Disposition: A | Discharge status: Home / Self Care | Payer: PPO | Attending: Student in an Organized Health Care Education/Training Program | Admitting: Student in an Organized Health Care Education/Training Program

## 2018-10-05 ENCOUNTER — Other Ambulatory Visit: Payer: Self-pay

## 2018-10-05 DIAGNOSIS — I2510 Atherosclerotic heart disease of native coronary artery without angina pectoris: Secondary | ICD-10-CM | POA: Diagnosis not present

## 2018-10-05 DIAGNOSIS — I10 Essential (primary) hypertension: Secondary | ICD-10-CM | POA: Diagnosis not present

## 2018-10-05 DIAGNOSIS — M25511 Pain in right shoulder: Secondary | ICD-10-CM | POA: Diagnosis not present

## 2018-10-05 DIAGNOSIS — J439 Emphysema, unspecified: Secondary | ICD-10-CM | POA: Diagnosis not present

## 2018-10-05 DIAGNOSIS — Z7982 Long term (current) use of aspirin: Secondary | ICD-10-CM | POA: Diagnosis not present

## 2018-10-05 DIAGNOSIS — M542 Cervicalgia: Secondary | ICD-10-CM | POA: Diagnosis not present

## 2018-10-05 DIAGNOSIS — Z79899 Other long term (current) drug therapy: Secondary | ICD-10-CM | POA: Insufficient documentation

## 2018-10-05 DIAGNOSIS — I6521 Occlusion and stenosis of right carotid artery: Secondary | ICD-10-CM | POA: Diagnosis not present

## 2018-10-05 DIAGNOSIS — R918 Other nonspecific abnormal finding of lung field: Secondary | ICD-10-CM | POA: Diagnosis not present

## 2018-10-05 LAB — COMPREHENSIVE METABOLIC PANEL
ALT: 16 U/L (ref 0–44)
AST: 26 U/L (ref 15–41)
Albumin: 4.8 g/dL (ref 3.5–5.0)
Alkaline Phosphatase: 83 U/L (ref 38–126)
Anion gap: 11 (ref 5–15)
BUN: 25 mg/dL — ABNORMAL HIGH (ref 8–23)
CO2: 22 mmol/L (ref 22–32)
Calcium: 9.6 mg/dL (ref 8.9–10.3)
Chloride: 104 mmol/L (ref 98–111)
Creatinine, Ser: 1.54 mg/dL — ABNORMAL HIGH (ref 0.61–1.24)
GFR calc Af Amer: 46 mL/min — ABNORMAL LOW (ref >60)
GFR calc non Af Amer: 40 mL/min — ABNORMAL LOW (ref >60)
Glucose, Bld: 114 mg/dL — ABNORMAL HIGH (ref 70–99)
Potassium: 3.9 mmol/L (ref 3.5–5.1)
Sodium: 137 mmol/L (ref 135–145)
Total Bilirubin: 1.2 mg/dL (ref 0.3–1.2)
Total Protein: 8.3 g/dL — ABNORMAL HIGH (ref 6.5–8.1)

## 2018-10-05 LAB — TROPONIN I (HIGH SENSITIVITY): Troponin I (High Sensitivity): 17 ng/L (ref <18)

## 2018-10-05 LAB — CBC WITH DIFFERENTIAL/PLATELET
Abs Immature Granulocytes: 0.12 10*3/uL — ABNORMAL HIGH (ref 0.00–0.07)
Basophils Absolute: 0 10*3/uL (ref 0.0–0.1)
Basophils Relative: 0 %
Eosinophils Absolute: 0.1 10*3/uL (ref 0.0–0.5)
Eosinophils Relative: 1 %
HCT: 43.2 % (ref 39.0–52.0)
Hemoglobin: 14.5 g/dL (ref 13.0–17.0)
Immature Granulocytes: 1 %
Lymphocytes Relative: 10 %
Lymphs Abs: 0.9 10*3/uL (ref 0.7–4.0)
MCH: 29.8 pg (ref 26.0–34.0)
MCHC: 33.6 g/dL (ref 30.0–36.0)
MCV: 88.7 fL (ref 80.0–100.0)
Monocytes Absolute: 1.5 10*3/uL — ABNORMAL HIGH (ref 0.1–1.0)
Monocytes Relative: 16 %
Neutro Abs: 6.7 10*3/uL (ref 1.7–7.7)
Neutrophils Relative %: 72 %
Platelets: 110 10*3/uL — ABNORMAL LOW (ref 150–400)
RBC: 4.87 MIL/uL (ref 4.22–5.81)
RDW: 15.7 % — ABNORMAL HIGH (ref 11.5–15.5)
WBC: 9.3 10*3/uL (ref 4.0–10.5)
nRBC: 0 % (ref 0.0–0.2)

## 2018-10-05 IMAGING — DX PORTABLE CHEST - 1 VIEW
1 series · 1 of 1 positions shown · non-contrast
Comparison: None.

CLINICAL DATA: Patient with right shoulder and neck pain.

EXAM:
PORTABLE CHEST 1 VIEW

[chest ap]
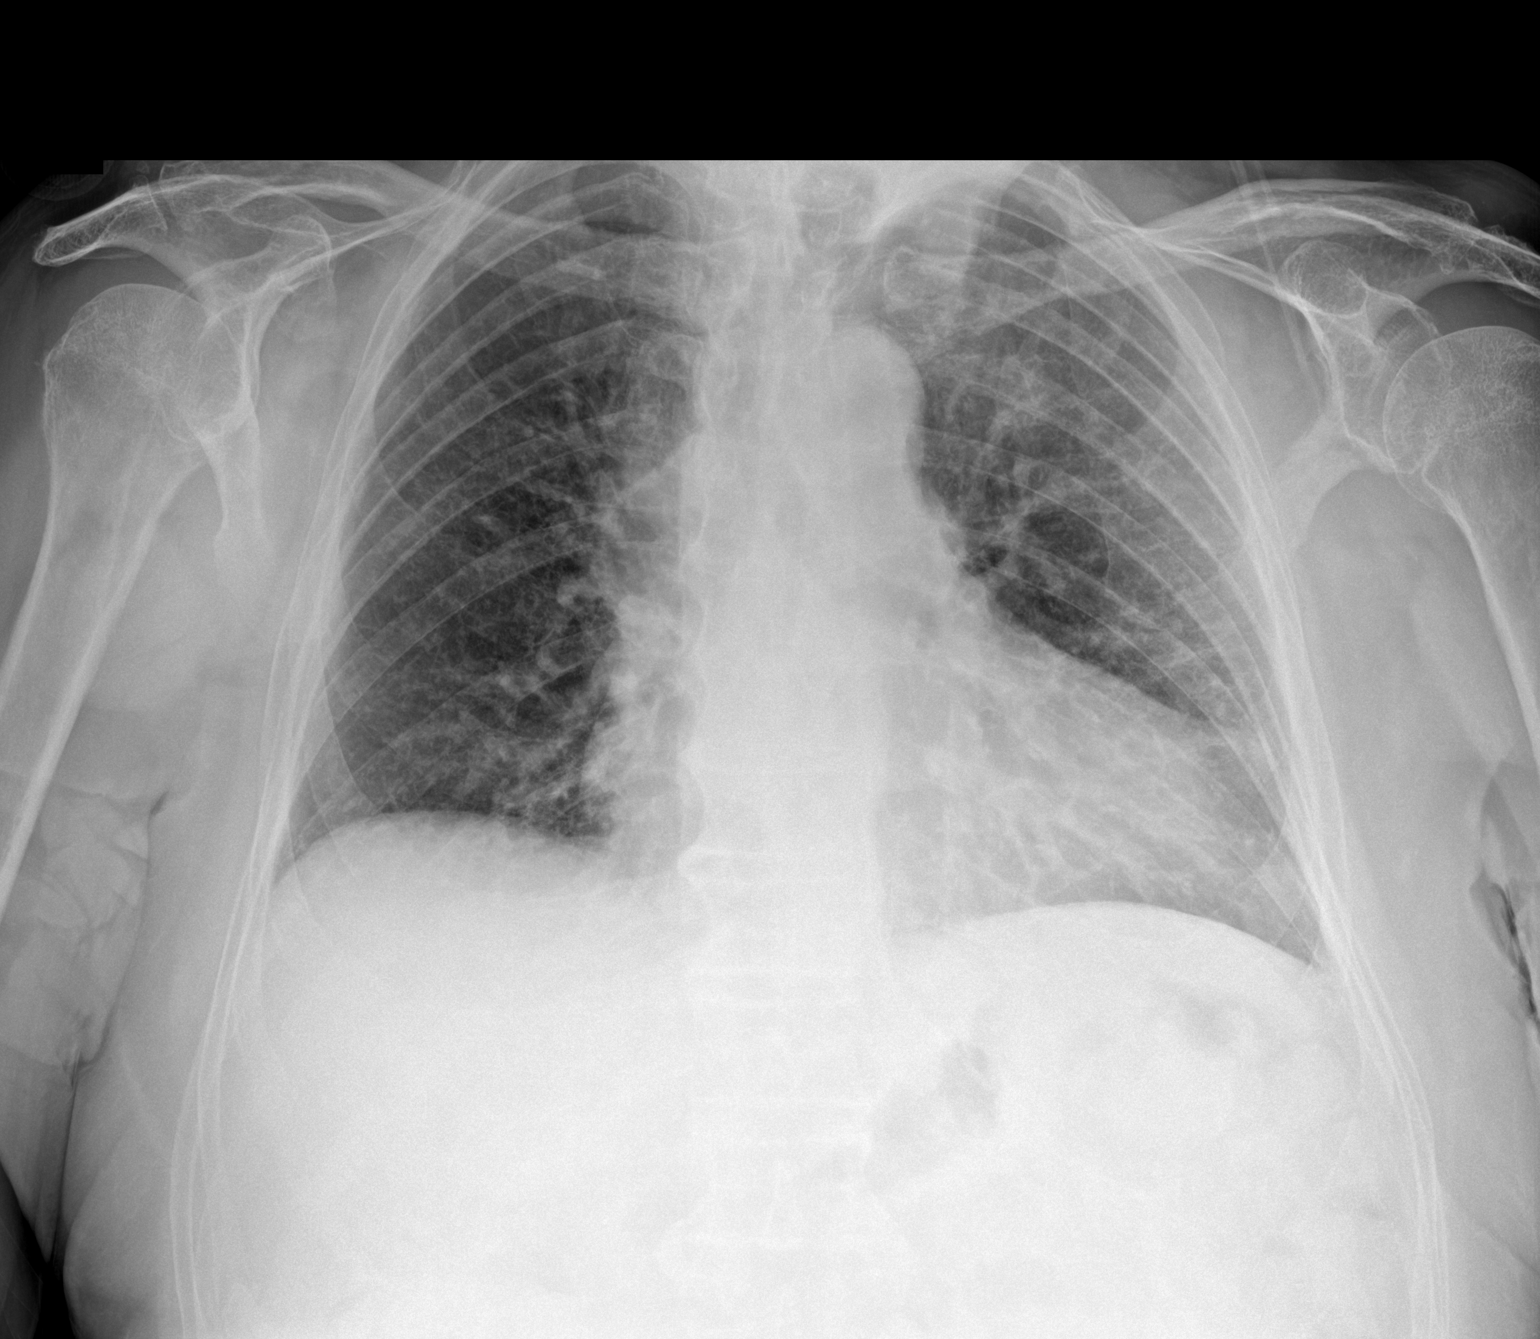

[1 of 1 positions shown; findings below may reference images not displayed]

FINDINGS: Enlarged cardiac and mediastinal contours. Patient is rotated to the
left. Bilateral coarse interstitial pulmonary opacities. Nodular
opacity left upper hemithorax. Thoracic spine degenerative changes.
IMPRESSION: Cardiomegaly.

Coarse interstitial opacities bilaterally may represent mild
interstitial pulmonary edema or atypical infectious process.

Nodular opacities left upper hemithorax may represent overlapping
structures. Recommend PA and lateral chest radiograph for
re-evaluation.

## 2018-10-05 IMAGING — CT CT ANGIOGRAPHY CHEST
2 of 7 series · 3 of 16 positions shown · IV contrast (omnipaque)
Comparison: None.

CLINICAL DATA: Right-sided shoulder and neck pain.

EXAM:
CT ANGIOGRAPHY CHEST WITH CONTRAST
TECHNIQUE: Multidetector CT imaging of the chest was performed using the
standard protocol during bolus administration of intravenous
contrast. Multiplanar CT image reconstructions and MIPs were
obtained to evaluate the vascular anatomy.
CONTRAST:  80mL OMNIPAQUE IOHEXOL 350 MG/ML SOLN

[Series 4: axial cta chest · axial · 0.58mm/px · z∈[-553,-319]mm · 2 of 79 slices shown]
[im 1/79  lung]
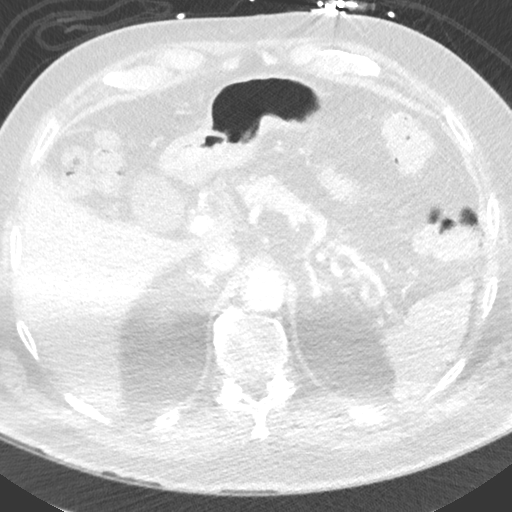
[im 79/79  soft-tissue]
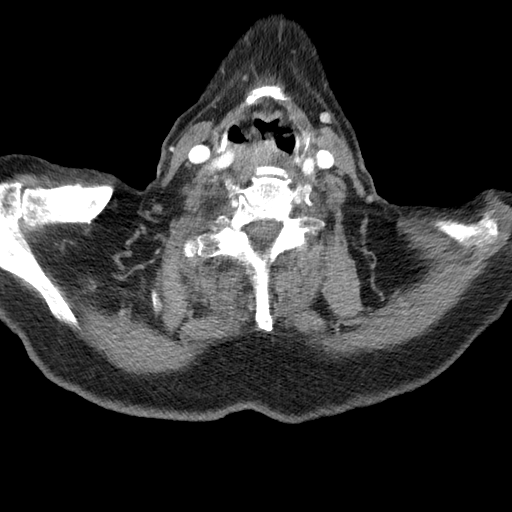

[Series 7: coronals · coronal · 0.49mm/px · 1 of 123 slices shown]
[im 62/123  soft-tissue]
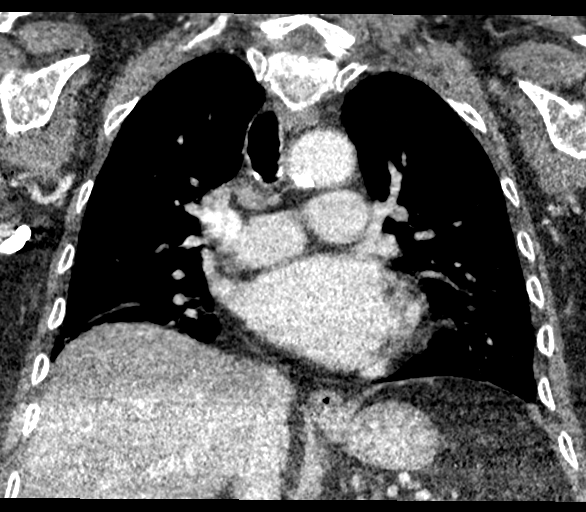

[3 of 16 positions shown; findings below may reference images not displayed]

FINDINGS: Cardiovascular: The thoracic aorta is normal in caliber and
demonstrates no evidence of aneurysmal disease or dissection.
Scattered plaque is present. Proximal great vessels demonstrate
normal branching anatomy and normal patency.

The heart is mildly enlarged. No pericardial fluid identified. There
is extensive calcified plaque in the coronary arteries in a 3 vessel
distribution. Central pulmonary arteries are normal in caliber. The
pulmonary arteries are not optimally opacified but large central
pulmonary embolism can be excluded.

Mediastinum/Nodes: No enlarged mediastinal, hilar, or axillary lymph
nodes. Thyroid gland, trachea, and esophagus demonstrate no
significant findings.

Lungs/Pleura: There is no evidence of pulmonary edema,
consolidation, pneumothorax, nodule or pleural fluid.

Upper Abdomen: No acute abnormality.

Musculoskeletal: No chest wall abnormality. No acute or significant
osseous findings.

Review of the MIP images confirms the above findings.
IMPRESSION: 1. No acute aortic pathology.
2. No evidence of large central pulmonary embolism.
3. Extensive coronary atherosclerosis with calcified plaque in a 3
vessel distribution.

## 2018-10-05 IMAGING — CR CHEST - 2 VIEW
2 series · 2 of 2 positions shown · non-contrast
Comparison: Portable exam from earlier today.

CLINICAL DATA: Nodular density seen over upper left hemithorax on
portable film.

EXAM:
CHEST - 2 VIEW

[chest pa]
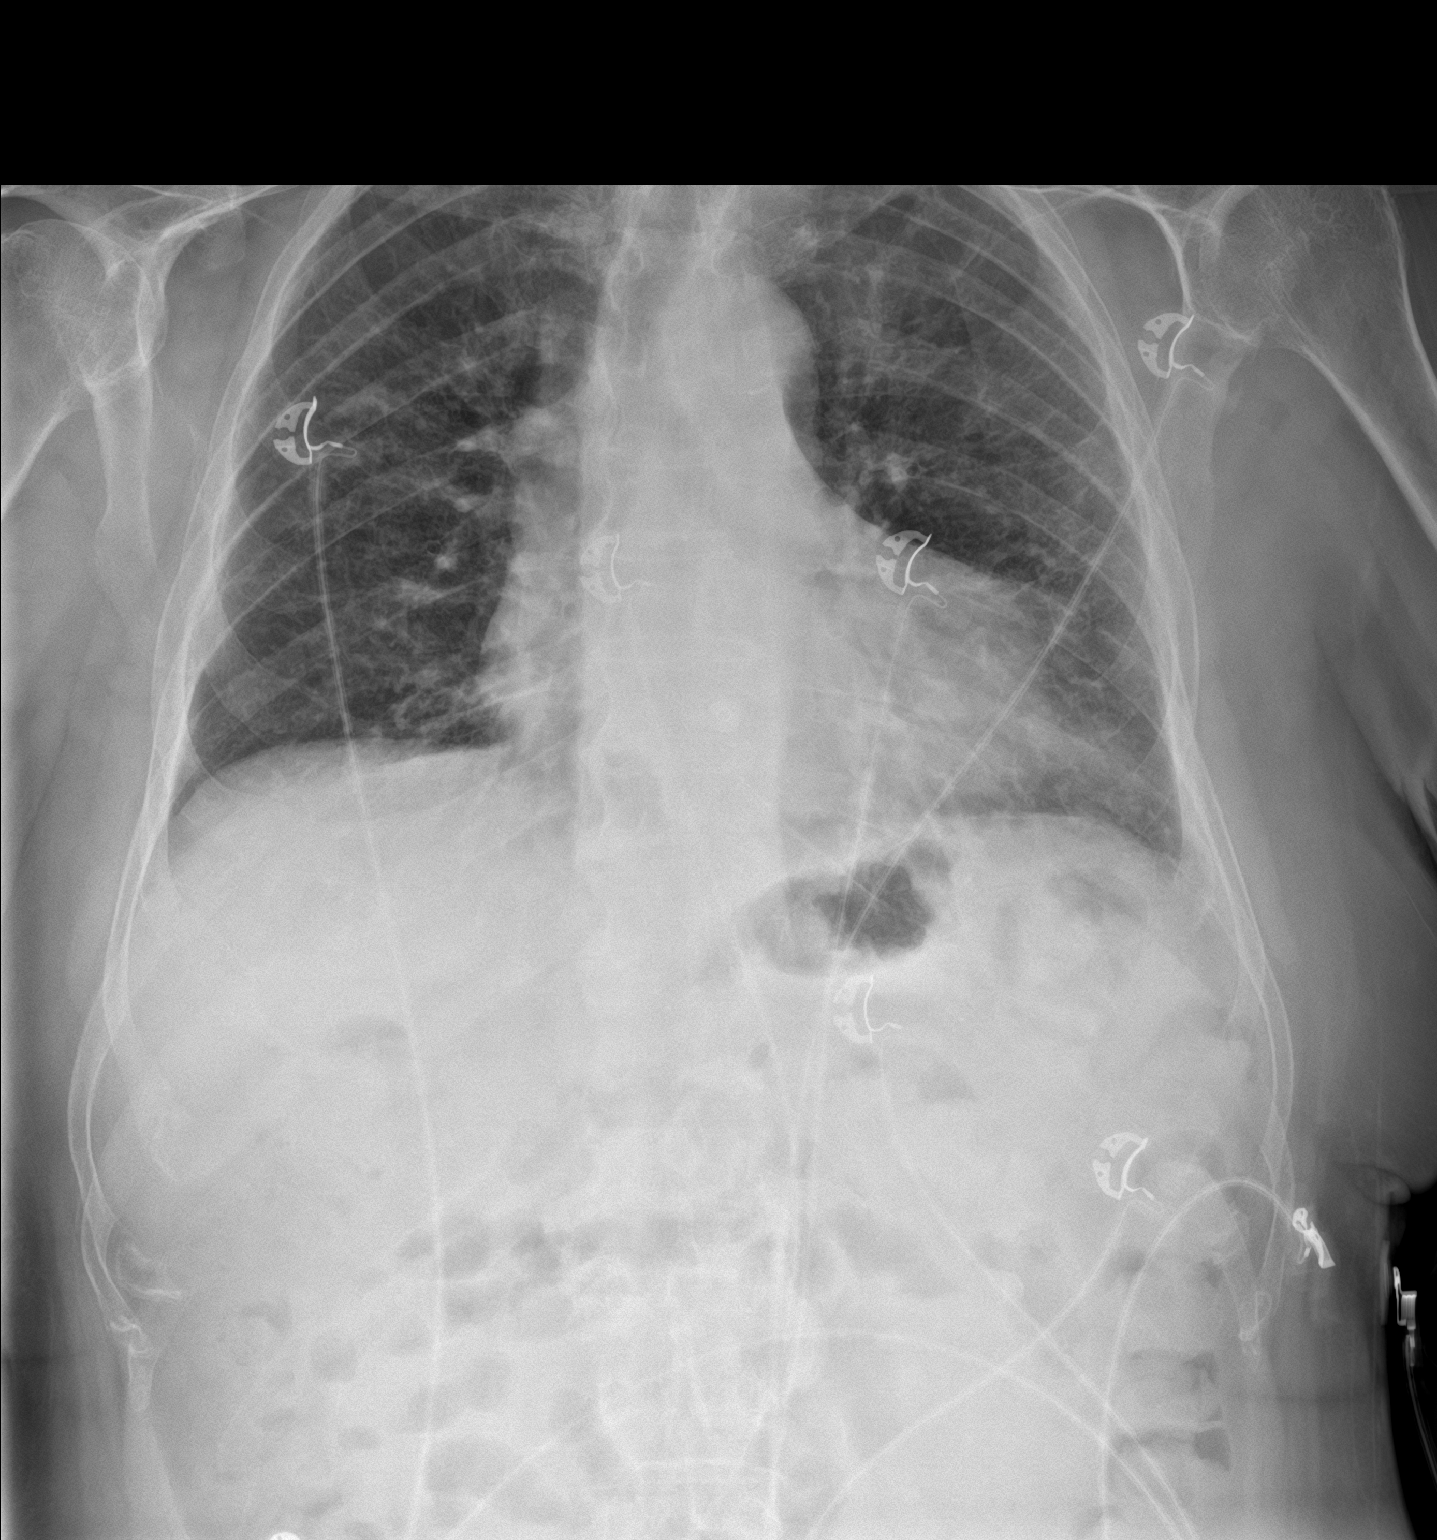

[chest lat]
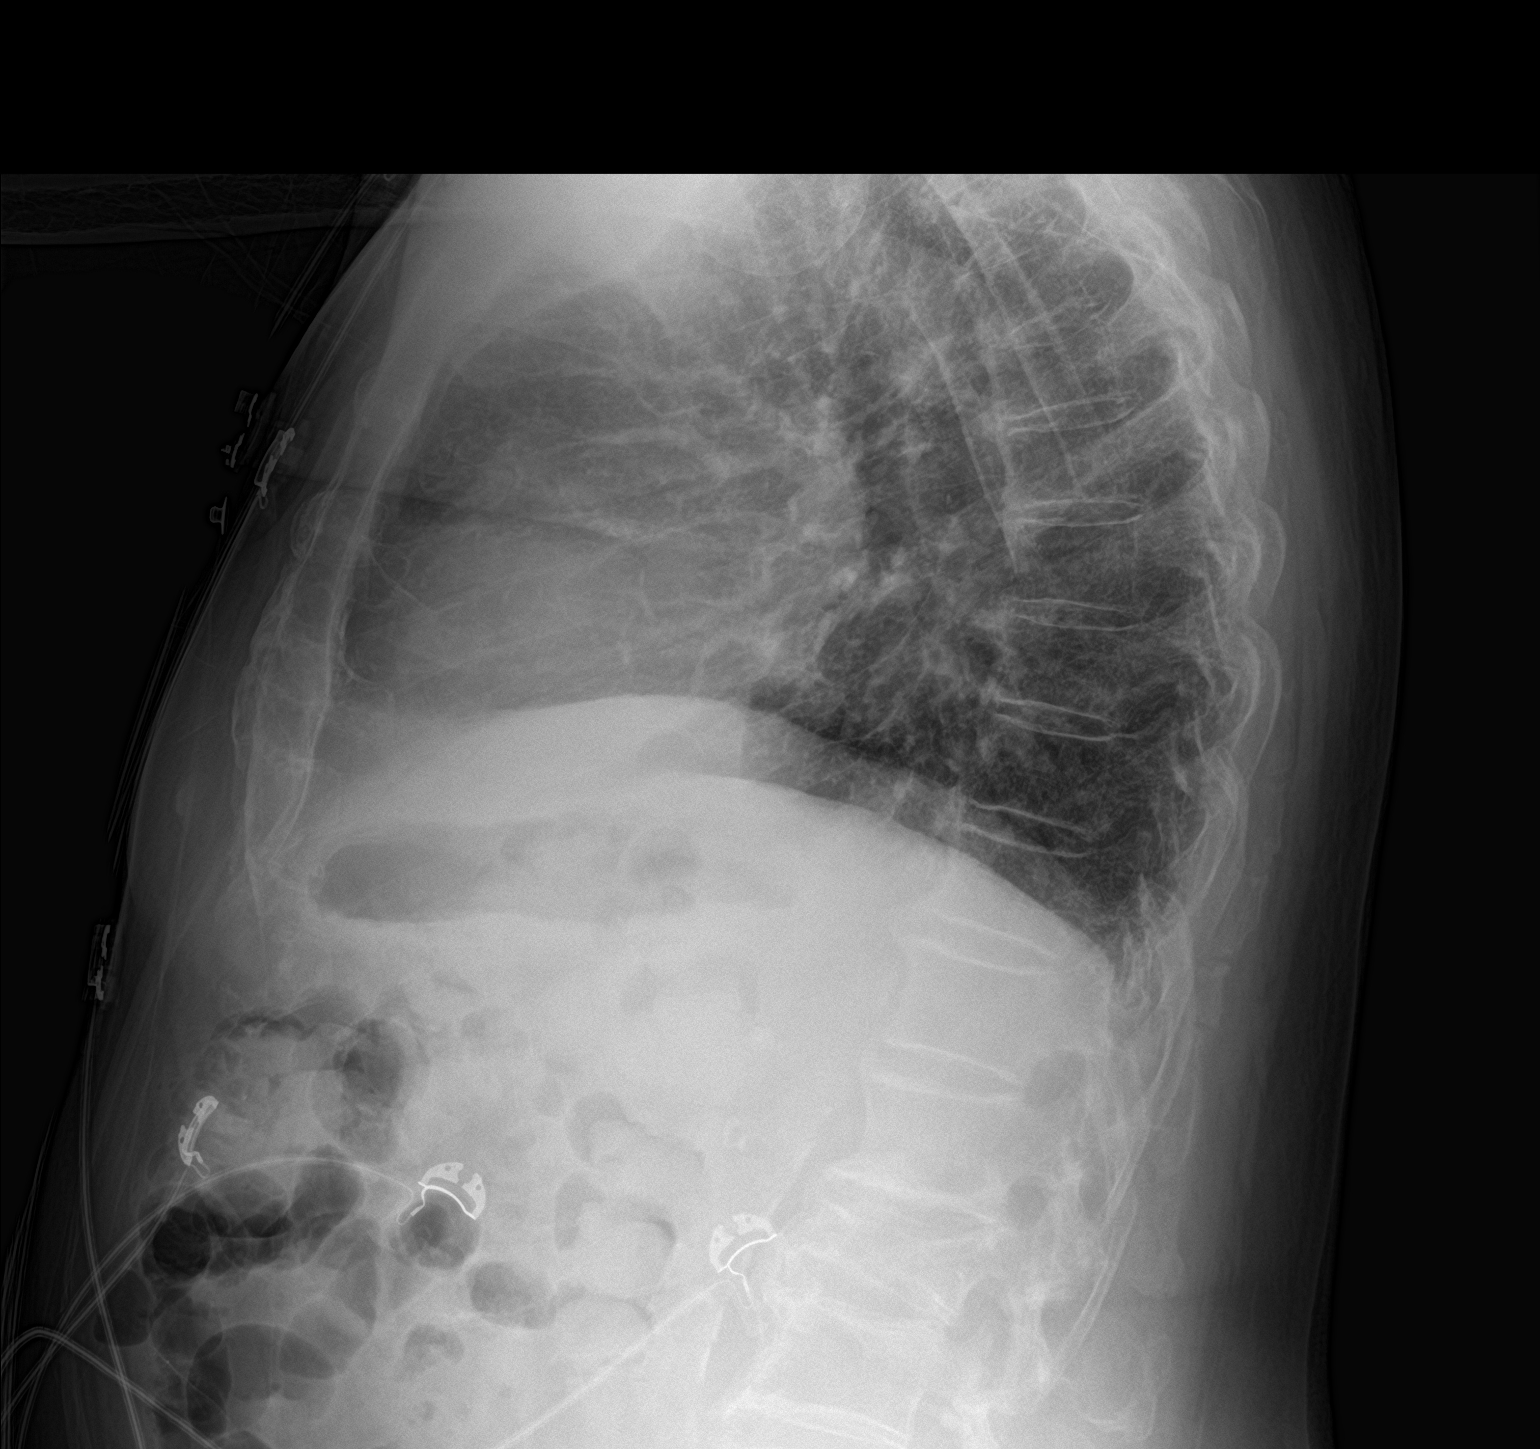

[2 of 2 positions shown; findings below may reference images not displayed]

FINDINGS: Lungs are hyperexpanded. Interstitial markings are diffusely
coarsened with chronic features. Tiny nodular densities are seen
over each upper lung period The cardio pericardial silhouette is
enlarged. Bones are diffusely demineralized. Telemetry leads overlie
the chest.
IMPRESSION: Emphysema with tiny nodular densities over the upper lungs. CT chest
without contrast suggested to further evaluate.

## 2018-10-05 IMAGING — CT CT ANGIOGRAPHY NECK
3 of 7 series · 6 of 16 positions shown · IV contrast (APPLIED)
Comparison: CTA of the neck [DATE] at [REDACTED].

CLINICAL DATA: Arterial stricture/occlusion of the head and neck.
Pain associated with right shoulder blade and neck. Unable to turn
head to the right.

EXAM:
CT ANGIOGRAPHY NECK
TECHNIQUE: Multidetector CT imaging of the neck was performed using the
standard protocol during bolus administration of intravenous
contrast. Multiplanar CT image reconstructions and MIPs were
obtained to evaluate the vascular anatomy. Carotid stenosis
measurements (when applicable) are obtained utilizing NASCET
criteria, using the distal internal carotid diameter as the
denominator.
CONTRAST:  80mL OMNIPAQUE IOHEXOL 350 MG/ML SOLN

[Series 8: ax thin · axial · 0.34mm/px · z∈[-384,-274]mm · 3 of 235 slices shown]
[im 59/235  soft-tissue]
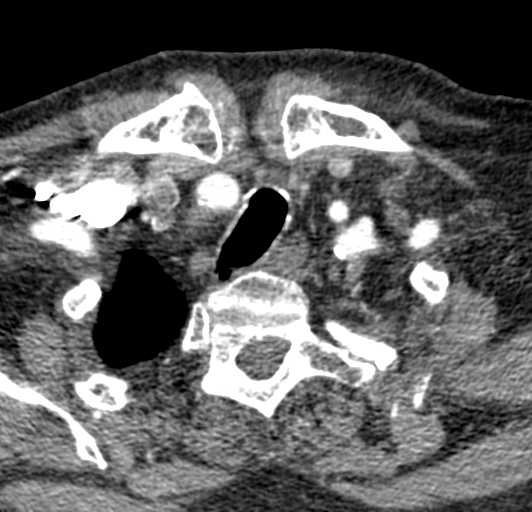
[im 118/235  bone]
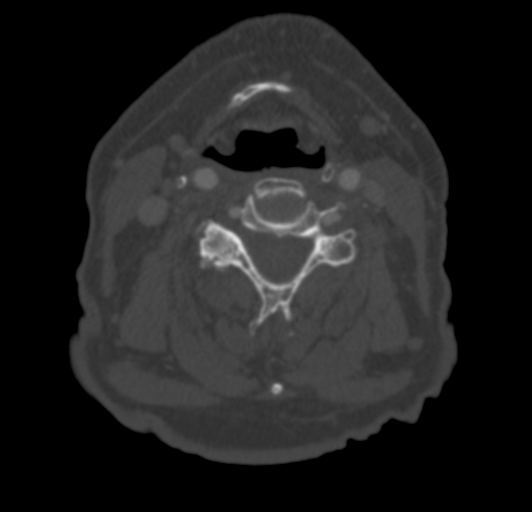
[im 176/235  soft-tissue]
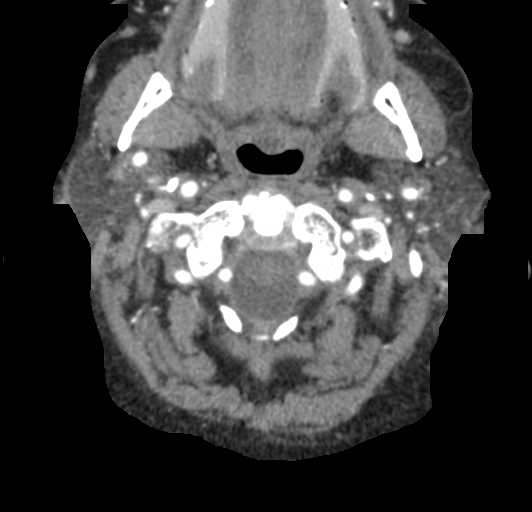

[Series 9: coronal thin · coronal · 0.36mm/px · 2 of 177 slices shown]
[im 59/177  soft-tissue]
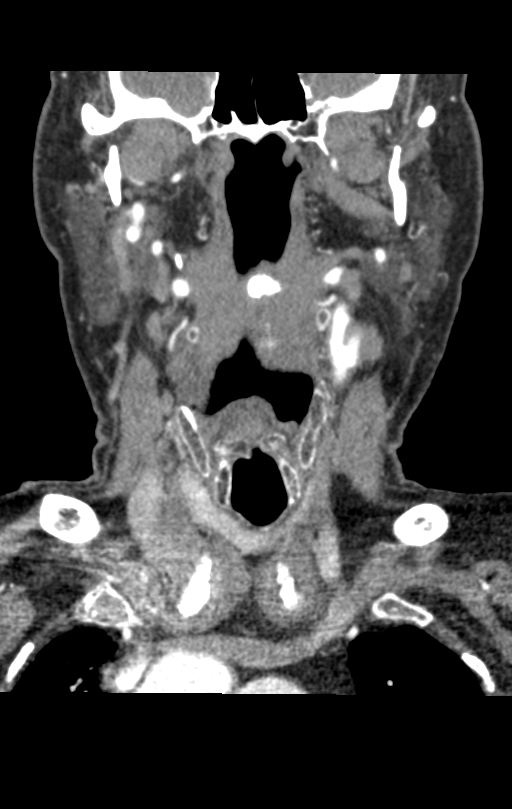
[im 118/177  soft-tissue]
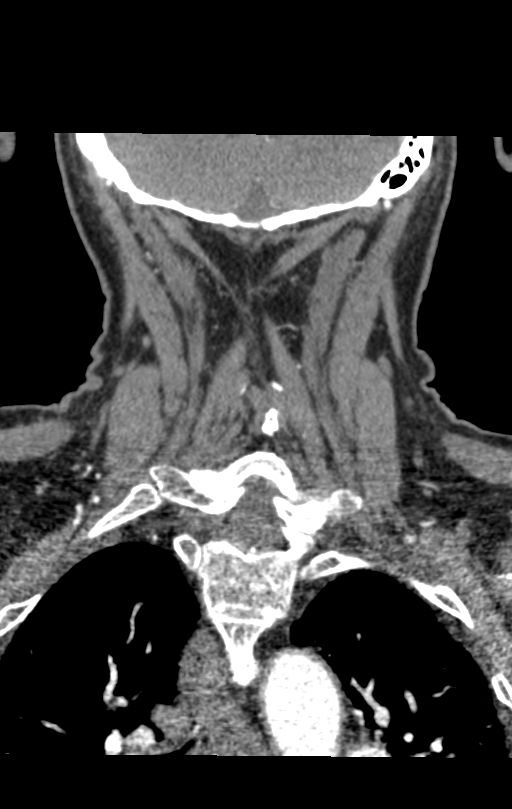

[Series 12: sagittal thin · sagittal · 0.35mm/px · 1 of 169 slices shown]
[im 85/169  soft-tissue]
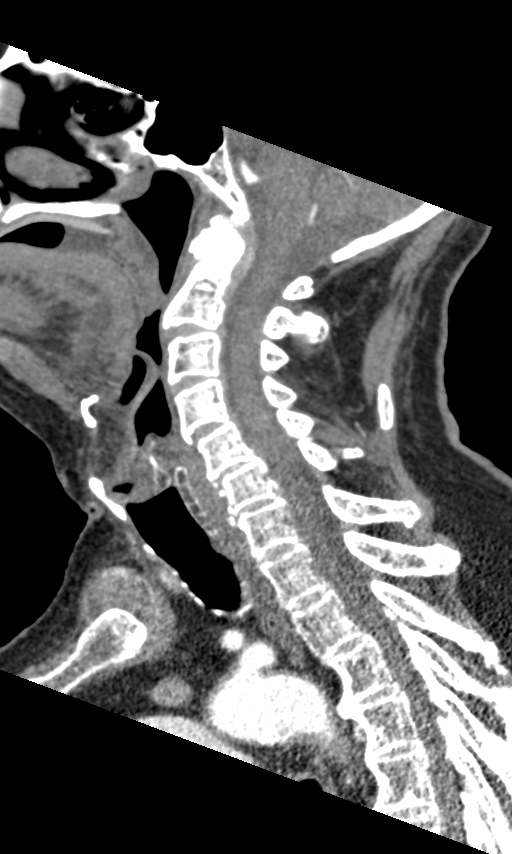

[6 of 16 positions shown; findings below may reference images not displayed]

FINDINGS: Aortic arch: A 3 vessel arch configuration is present.
Atherosclerotic calcifications are present at the arch. There is no
significant stenosis of the great vessel origins. No aneurysm is
present.

Right carotid system: Mild tortuosity is present in the cervical
right ICA. There is no significant stenosis of the cervical ICA.
Dense calcifications are present at the right carotid bifurcation.
Minimal luminal diameter is 1.2 mm. This compares with more normal
distal vessel measurement of 4.4 mm. No tandem stenosis is present.
A right internal carotid artery is otherwise normal through the
skull base. The

Left carotid system: The left common carotid artery is within normal
limits. There is minimal atherosclerotic irregularity at the
bifurcation without a significant stenosis. Question prior
endarterectomy cervical left ICA is otherwise normal.

Vertebral arteries: Vertebral arteries are codominant.
Atherosclerotic calcifications are present at the origins of both
vessels. There is a high-grade stenosis on the right. Stenosis on
the left is less than 50%. No additional stenoses are present in
either vertebral artery in the neck. PICA origins are visualized and
normal. Vertebrobasilar junction is normal.

Skeleton: Grade 1 anterolisthesis present at C4-5 and C5-6.
Vertebral body heights are maintained. No focal lytic or blastic
lesions are present. Mild leftward curvature is present in the upper
cervical spine.

Advanced right-sided facet hypertrophy and sclerosis is noted
particularly at C4-5, C5-6, and C6-7. No acute or healing fractures
are evident. Left-sided facet disease is more mild.

Degenerative changes are noted at the sternoclavicular joints
bilaterally as well.

Other neck: The soft tissues the neck otherwise unremarkable.
Thyroid is atrophic. No significant adenopathy is present. The
salivary glands are within normal limits.

Upper chest: Unremarkable.
IMPRESSION: 1. Progressive atherosclerotic calcification and stenosis of the
right internal carotid artery, now measuring greater than 70%.
2. Left carotid endarterectomy without residual or recurrent
stenosis.
3. High-grade stenosis at the origin of the right vertebral artery.
4. Calcification with 50% stenosis of the proximal left vertebral
artery.
5. No acute or focal vascular lesion to explain the patient's neck
pain.
6. Advanced asymmetric right-sided facet hypertrophy is present.
This correlates with right-sided neck pain, made worse when turning
to the right.

## 2018-10-05 MED ORDER — HYDROCODONE-ACETAMINOPHEN 7.5-325 MG/15ML PO SOLN
5.0000 mL | Freq: Once | ORAL | Status: AC
  Administered 2018-10-05: 10:00:00 5 mL via ORAL
  Filled 2018-10-05: qty 15

## 2018-10-05 MED ORDER — FENTANYL CITRATE (PF) 100 MCG/2ML IJ SOLN
25.0000 ug | Freq: Once | INTRAMUSCULAR | Status: AC
  Administered 2018-10-05: 12:00:00 25 ug via INTRAVENOUS
  Filled 2018-10-05: qty 2

## 2018-10-05 MED ORDER — LIDOCAINE 5 % EX PTCH
1.0000 | MEDICATED_PATCH | CUTANEOUS | Status: DC
  Administered 2018-10-05: 1 via TRANSDERMAL
  Filled 2018-10-05: qty 1

## 2018-10-05 MED ORDER — IOHEXOL 350 MG/ML SOLN
80.0000 mL | Freq: Once | INTRAVENOUS | Status: AC | PRN
  Administered 2018-10-05: 11:00:00 80 mL via INTRAVENOUS

## 2018-10-05 MED ORDER — LIDOCAINE 5 % EX PTCH: 1.0000 | MEDICATED_PATCH | Freq: Two times a day (BID) | CUTANEOUS | 0 refills | Status: AC

## 2018-10-05 MED ORDER — TRAMADOL HCL 50 MG PO TABS: 50.0000 mg | ORAL_TABLET | Freq: Two times a day (BID) | ORAL | 0 refills | Status: AC | PRN

## 2018-10-05 NOTE — ED Notes (Signed)
Dr Robinson at bedside 

## 2018-10-05 NOTE — ED Notes (Signed)
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

## 2018-10-05 NOTE — ED Notes (Signed)
Report given to Teresa, RN

## 2018-10-05 NOTE — ED Triage Notes (Addendum)
Pt reports right side shoulder and neck pain. Pt states the pain started Thursday night under his right shoulder blade and radiated to his neck. Pt states the pain is excruciating and cannot turn his head to the right.  Pt states the pain causes him to lose his breath.

## 2018-10-05 NOTE — ED Notes (Signed)
Patient transported to X-ray 

## 2018-10-05 NOTE — ED Provider Notes (Signed)
[facility] Emergency Department Provider Note    First MD Initiated Contact with Patient 10/05/18 [phone]     (approximate)  I have reviewed the triage vital signs and the nursing notes.   HISTORY  Chief Complaint Neck Pain and Shoulder Pain    HPI Aaron Burton is a 85 y.o. male close past medical history presents the ER for evaluation of progressively worsening right shoulder and neck pain.  Is been ongoing for roughly 2 days now.  Denies any fevers.  No chest pain or shortness of breath.  No numbness or tingling.  States that initially started at the base of his right scapula and is now radiated up into the back of his neck.  Pain is worse when he turns his head to the right and is reproduced with palpation of the right posterior neck muscles.  Denies any heavy lifting or trauma.    Past Medical History:  Diagnosis Date  . Coronary artery disease   . Gout   . Hypertension    Family History  Problem Relation Age of Onset  . Stroke Mother   . Hypertension Father   . Heart disease Father    Past Surgical History:  Procedure Laterality Date  . ACHILLES TENDON REPAIR    . CAROTID ARTERY ANGIOPLASTY    . COLON SURGERY    . LEG SURGERY    . LITHOTRIPSY     Patient Active Problem List   Diagnosis Date Noted  . Angioedema 07/09/2018      Prior to Admission medications   Medication Sig Start Date End Date Taking? Authorizing Provider  amLODipine (NORVASC) 5 MG tablet Take 1 tablet by mouth daily. 02/18/17   [provider]  aspirin EC 81 MG tablet Take 1 tablet by mouth daily.    [provider]  Cholecalciferol 125 MCG (5000 UT) TABS Take 5,000 Units by mouth 2 (two) times a week.    [provider]  colchicine 0.6 MG tablet Take 1 tablet (0.6 mg total) by mouth daily as needed (gout). 07/10/18   Fritzi Mandes, MD  diphenhydrAMINE (BENADRYL) 25 mg capsule Take 1 capsule (25 mg total) by mouth 3 (three) times daily for 5  days. 07/10/18 07/15/18  Fritzi Mandes, MD  famotidine (PEPCID) 10 MG tablet Take 1 tablet (10 mg total) by mouth 2 (two) times daily. 07/10/18   Fritzi Mandes, MD  lidocaine (LIDODERM) 5 % Place 1 patch onto the skin every 12 (twelve) hours. Remove & Discard patch within 12 hours or as directed by MD 10/05/18 10/05/19  Merlyn Lot, MD  Multiple Vitamin (MULTI-VITAMINS) TABS Take 1 tablet by mouth daily.    [provider]  predniSONE (DELTASONE) 10 MG tablet Take 50 mg daily --taper by 10 mg daily then stop 07/10/18   Fritzi Mandes, MD  senna-docusate (VEGETABLE LAX+STOOL SOFTENER) 8.6-50 MG tablet Take 0.5 tablets by mouth every evening.    [provider]  simvastatin (ZOCOR) 20 MG tablet Take 20 mg by mouth every evening.     [provider]  traMADol (ULTRAM) 50 MG tablet Take 1 tablet (50 mg total) by mouth every 12 (twelve) hours as needed for severe pain. 10/05/18 10/05/19  Merlyn Lot, MD  triamcinolone cream (KENALOG) 0.1 % Apply 1 application topically 2 (two) times daily. 05/19/18   [provider]    Allergies Ace inhibitors    Social History Social History   Tobacco Use  . Smoking status: Never Smoker  .  Smokeless tobacco: Never Used  Substance Use Topics  . Alcohol use: No  . Drug use: No    Review of Systems Patient denies headaches, rhinorrhea, blurry vision, numbness, shortness of breath, chest pain, edema, cough, abdominal pain, nausea, vomiting, diarrhea, dysuria, fevers, rashes or hallucinations unless otherwise stated above in HPI. ____________________________________________   PHYSICAL EXAM:  VITAL SIGNS: Vitals:   10/05/18 1140 10/05/18 1200  BP: (!) 152/81 (!) 153/76  Pulse: 84 81  Resp: 19 (!) 22  Temp:    SpO2: 97% 95%    Constitutional: Alert and oriented.  Eyes: Conjunctivae are normal.  Head: Atraumatic. Nose: No congestion/rhinnorhea. Mouth/Throat: Mucous membranes are moist.   Neck: No stridor. Painless  ROM.  Cardiovascular: Normal rate, regular rhythm. Grossly normal heart sounds.  Good peripheral circulation. Respiratory: Normal respiratory effort.  No retractions. Lungs CTAB. Gastrointestinal: Soft and nontender. No distention. No abdominal bruits. No CVA tenderness. Genitourinary:  Musculoskeletal: No lower extremity tenderness nor edema.  No joint effusions. Neurologic:  Normal speech and language. No gross focal neurologic deficits are appreciated. No facial droop Skin:  Skin is warm, dry and intact. No rash noted. Psychiatric: Mood and affect are normal. Speech and behavior are normal.  ____________________________________________   LABS (all labs ordered are listed, but only abnormal results are displayed)  Results for orders placed or performed during the hospital encounter of 10/05/18 (from the past 24 hour(s))  CBC with Differential/Platelet     Status: Abnormal   Collection Time: 10/05/18  9:32 AM  Result Value Ref Range   WBC 9.3 4.0 - 10.5 K/uL   RBC 4.87 4.22 - 5.81 MIL/uL   Hemoglobin 14.5 13.0 - 17.0 g/dL   HCT 43.2 39.0 - 52.0 %   MCV 88.7 80.0 - 100.0 fL   MCH 29.8 26.0 - 34.0 pg   MCHC 33.6 30.0 - 36.0 g/dL   RDW 15.7 (H) 11.5 - 15.5 %   Platelets 110 (L) 150 - 400 K/uL   nRBC 0.0 0.0 - 0.2 %   Neutrophils Relative % 72 %   Neutro Abs 6.7 1.7 - 7.7 K/uL   Lymphocytes Relative 10 %   Lymphs Abs 0.9 0.7 - 4.0 K/uL   Monocytes Relative 16 %   Monocytes Absolute 1.5 (H) 0.1 - 1.0 K/uL   Eosinophils Relative 1 %   Eosinophils Absolute 0.1 0.0 - 0.5 K/uL   Basophils Relative 0 %   Basophils Absolute 0.0 0.0 - 0.1 K/uL   Immature Granulocytes 1 %   Abs Immature Granulocytes 0.12 (H) 0.00 - 0.07 K/uL  Comprehensive metabolic panel     Status: Abnormal   Collection Time: 10/05/18  9:32 AM  Result Value Ref Range   Sodium 137 135 - 145 mmol/L   Potassium 3.9 3.5 - 5.1 mmol/L   Chloride 104 98 - 111 mmol/L   CO2 22 22 - 32 mmol/L   Glucose, Bld 114 (H) 70 -  99 mg/dL   BUN 25 (H) 8 - 23 mg/dL   Creatinine, Ser 1.54 (H) 0.61 - 1.24 mg/dL   Calcium 9.6 8.9 - 10.3 mg/dL   Total Protein 8.3 (H) 6.5 - 8.1 g/dL   Albumin 4.8 3.5 - 5.0 g/dL   AST 26 15 - 41 U/L   ALT 16 0 - 44 U/L   Alkaline Phosphatase 83 38 - 126 U/L   Total Bilirubin 1.2 0.3 - 1.2 mg/dL   GFR calc non Af Amer 40 (L) >60 mL/min  GFR calc Af Amer 46 (L) >60 mL/min   Anion gap 11 5 - 15  Troponin I (High Sensitivity)     Status: None   Collection Time: 10/05/18  9:32 AM  Result Value Ref Range   Troponin I (High Sensitivity) 17 <18 ng/L   ____________________________________________  EKG My review and personal interpretation at Time:  9:47  Indication: neck pain  Rate: 80  Rhythm: sinus Axis: normal Other: nonspecific st abn, poor r wave progression, no stemi ____________________________________________  RADIOLOGY  I personally reviewed all radiographic images ordered to evaluate for the above acute complaints and reviewed radiology reports and findings.  These findings were personally discussed with the patient.  Please see medical record for radiology report.  ____________________________________________   PROCEDURES  Procedure(s) performed:  Procedures    Critical Care performed: no ____________________________________________   INITIAL IMPRESSION / ASSESSMENT AND PLAN / ED COURSE  Pertinent labs & imaging results that were available during my care of the patient were reviewed by me and considered in my medical decision making (see chart for details).   DDX: torticollis, muscle strain, dissection, ptx, pna, mass, acs  Aaron Burton is a 85 y.o. who presents to the ED with symptoms as described above.  Afebrile and hemodynamically stable.  Does not seem consistent with ACS given duration of symptoms reproducibility of pain.  Patient was given pain medication on scene evidence of Pancoast tumor that could be causing referred pain.  His abdominal exam is soft  and benign.  Does not seem consistent with biliary pathology referred in the right shoulder.  Given his history of atherosclerotic disease CT imaging ordered to exclude dissection or aneurysm causing the pain.  Does have diffuse atherosclerotic disease without acute pathology.  Does not have any evidence of stroke.  No dissection.  Symptoms most consistent with severe arthritis.  Patient currently is pain-free and declining any additional pain medications.  At this point will be stable and appropriate for outpatient follow-up.     The patient was evaluated in Emergency Department today for the symptoms described in the history of present illness. He/she was evaluated in the context of the global COVID-19 pandemic, which necessitated consideration that the patient might be at risk for infection with the SARS-CoV-2 virus that causes COVID-19. Institutional protocols and algorithms that pertain to the evaluation of patients at risk for COVID-19 are in a state of rapid change based on information released by regulatory bodies including the CDC and federal and state organizations. These policies and algorithms were followed during the patient's care in the ED.  As part of my medical decision making, I reviewed the following data within the [facility] notes reviewed and incorporated, Labs reviewed, notes from prior ED visits and [place] Controlled Substance Database   ____________________________________________   FINAL CLINICAL IMPRESSION(S) / ED DIAGNOSES  Final diagnoses:  Acute pain of right shoulder  Neck pain      NEW MEDICATIONS STARTED DURING THIS VISIT:  New Prescriptions   LIDOCAINE (LIDODERM) 5 %    Place 1 patch onto the skin every 12 (twelve) hours. Remove & Discard patch within 12 hours or as directed by MD   TRAMADOL (ULTRAM) 50 MG TABLET    Take 1 tablet (50 mg total) by mouth every 12 (twelve) hours as needed for severe pain.     Note:  This document was  prepared using Dragon voice recognition software and may include unintentional dictation errors.    Quentin Cornwall,  Saralyn Pilar, MD 10/05/18 1239

## 2018-10-09 DIAGNOSIS — N183 Chronic kidney disease, stage 3 (moderate): Secondary | ICD-10-CM | POA: Diagnosis not present

## 2018-10-09 DIAGNOSIS — S46811A Strain of other muscles, fascia and tendons at shoulder and upper arm level, right arm, initial encounter: Secondary | ICD-10-CM | POA: Diagnosis not present

## 2018-10-09 DIAGNOSIS — S46812A Strain of other muscles, fascia and tendons at shoulder and upper arm level, left arm, initial encounter: Secondary | ICD-10-CM | POA: Diagnosis not present

## 2018-10-09 DIAGNOSIS — M542 Cervicalgia: Secondary | ICD-10-CM | POA: Diagnosis not present

## 2019-01-30 DIAGNOSIS — L239 Allergic contact dermatitis, unspecified cause: Secondary | ICD-10-CM | POA: Diagnosis not present

## 2019-01-30 DIAGNOSIS — Z85828 Personal history of other malignant neoplasm of skin: Secondary | ICD-10-CM | POA: Diagnosis not present

## 2019-01-30 DIAGNOSIS — L570 Actinic keratosis: Secondary | ICD-10-CM | POA: Diagnosis not present

## 2019-01-30 DIAGNOSIS — D485 Neoplasm of uncertain behavior of skin: Secondary | ICD-10-CM | POA: Diagnosis not present

## 2019-01-30 DIAGNOSIS — D045 Carcinoma in situ of skin of trunk: Secondary | ICD-10-CM | POA: Diagnosis not present

## 2019-01-30 DIAGNOSIS — D0439 Carcinoma in situ of skin of other parts of face: Secondary | ICD-10-CM | POA: Diagnosis not present

## 2019-01-30 DIAGNOSIS — Z08 Encounter for follow-up examination after completed treatment for malignant neoplasm: Secondary | ICD-10-CM | POA: Diagnosis not present

## 2019-02-16 DIAGNOSIS — N183 Chronic kidney disease, stage 3 unspecified: Secondary | ICD-10-CM | POA: Diagnosis not present

## 2019-02-16 DIAGNOSIS — I129 Hypertensive chronic kidney disease with stage 1 through stage 4 chronic kidney disease, or unspecified chronic kidney disease: Secondary | ICD-10-CM | POA: Diagnosis not present

## 2019-02-16 DIAGNOSIS — E875 Hyperkalemia: Secondary | ICD-10-CM | POA: Diagnosis not present

## 2019-02-16 DIAGNOSIS — E1122 Type 2 diabetes mellitus with diabetic chronic kidney disease: Secondary | ICD-10-CM | POA: Diagnosis not present

## 2019-02-16 DIAGNOSIS — T783XXD Angioneurotic edema, subsequent encounter: Secondary | ICD-10-CM | POA: Diagnosis not present

## 2019-02-18 DIAGNOSIS — I129 Hypertensive chronic kidney disease with stage 1 through stage 4 chronic kidney disease, or unspecified chronic kidney disease: Secondary | ICD-10-CM | POA: Diagnosis not present

## 2019-02-18 DIAGNOSIS — E1122 Type 2 diabetes mellitus with diabetic chronic kidney disease: Secondary | ICD-10-CM | POA: Diagnosis not present

## 2019-02-18 DIAGNOSIS — R809 Proteinuria, unspecified: Secondary | ICD-10-CM | POA: Diagnosis not present

## 2019-02-18 DIAGNOSIS — N1832 Chronic kidney disease, stage 3b: Secondary | ICD-10-CM | POA: Diagnosis not present

## 2019-02-18 DIAGNOSIS — N2581 Secondary hyperparathyroidism of renal origin: Secondary | ICD-10-CM | POA: Diagnosis not present

## 2019-02-25 DIAGNOSIS — Z8739 Personal history of other diseases of the musculoskeletal system and connective tissue: Secondary | ICD-10-CM | POA: Diagnosis not present

## 2019-03-13 DIAGNOSIS — D0439 Carcinoma in situ of skin of other parts of face: Secondary | ICD-10-CM | POA: Diagnosis not present

## 2019-03-13 DIAGNOSIS — C44329 Squamous cell carcinoma of skin of other parts of face: Secondary | ICD-10-CM | POA: Diagnosis not present

## 2019-03-20 DIAGNOSIS — D045 Carcinoma in situ of skin of trunk: Secondary | ICD-10-CM | POA: Diagnosis not present

## 2019-03-31 DIAGNOSIS — D638 Anemia in other chronic diseases classified elsewhere: Secondary | ICD-10-CM | POA: Diagnosis not present

## 2019-03-31 DIAGNOSIS — R7303 Prediabetes: Secondary | ICD-10-CM | POA: Diagnosis not present

## 2019-03-31 DIAGNOSIS — E7800 Pure hypercholesterolemia, unspecified: Secondary | ICD-10-CM | POA: Diagnosis not present

## 2019-03-31 DIAGNOSIS — D696 Thrombocytopenia, unspecified: Secondary | ICD-10-CM | POA: Diagnosis not present

## 2019-03-31 DIAGNOSIS — I10 Essential (primary) hypertension: Secondary | ICD-10-CM | POA: Diagnosis not present

## 2019-04-07 DIAGNOSIS — I10 Essential (primary) hypertension: Secondary | ICD-10-CM | POA: Diagnosis not present

## 2019-04-07 DIAGNOSIS — D638 Anemia in other chronic diseases classified elsewhere: Secondary | ICD-10-CM | POA: Diagnosis not present

## 2019-04-07 DIAGNOSIS — R7303 Prediabetes: Secondary | ICD-10-CM | POA: Diagnosis not present

## 2019-04-07 DIAGNOSIS — N1832 Chronic kidney disease, stage 3b: Secondary | ICD-10-CM | POA: Diagnosis not present

## 2019-04-07 DIAGNOSIS — E7800 Pure hypercholesterolemia, unspecified: Secondary | ICD-10-CM | POA: Diagnosis not present

## 2019-04-07 DIAGNOSIS — D696 Thrombocytopenia, unspecified: Secondary | ICD-10-CM | POA: Diagnosis not present

## 2019-04-09 ENCOUNTER — Ambulatory Visit: Payer: PPO | Attending: Internal Medicine

## 2019-04-09 DIAGNOSIS — Z23 Encounter for immunization: Secondary | ICD-10-CM

## 2019-04-09 NOTE — Progress Notes (Signed)
   Covid-19 Vaccination Clinic  Name:  Aaron Burton    MRN: [id number] DOB: [birth year]  04/09/2019  [name] was observed post Covid-19 immunization for 15 minutes without incidence. He was provided with Vaccine Information Sheet and instruction to access the V-Safe system.   [name] was instructed to call 911 with any severe reactions post vaccine: Marland Kitchen Difficulty breathing  . Swelling of your face and throat  . A fast heartbeat  . A bad rash all over your body  . Dizziness and weakness    Immunizations Administered    Name Date Dose VIS Date Route   Pfizer COVID-19 Vaccine 04/09/2019 10:39 AM 0.3 mL 01/23/2019 Intramuscular   Manufacturer: [place]   Lot: [id number]   [place]: [id number]

## 2019-05-05 ENCOUNTER — Ambulatory Visit: Payer: PPO | Attending: Internal Medicine

## 2019-05-05 DIAGNOSIS — Z23 Encounter for immunization: Secondary | ICD-10-CM

## 2019-05-05 NOTE — Progress Notes (Signed)
   Covid-19 Vaccination Clinic  Name:  XAO CORTOPASSI    MRN: [id number] DOB: [birth year]  05/05/2019  Mr. Simonini was observed post Covid-19 immunization for 15 minutes without incident. He was provided with Vaccine Information Sheet and instruction to access the V-Safe system.   Mr. Hales was instructed to call 911 with any severe reactions post vaccine: Marland Kitchen Difficulty breathing  . Swelling of face and throat  . A fast heartbeat  . A bad rash all over body  . Dizziness and weakness   Immunizations Administered    Name Date Dose VIS Date Route   Pfizer COVID-19 Vaccine 05/05/2019 10:17 AM 0.3 mL 01/23/2019 Intramuscular   Manufacturer: [place]   Lot: [id number]   [place]: [id number]

## 2019-05-14 DIAGNOSIS — Z961 Presence of intraocular lens: Secondary | ICD-10-CM | POA: Diagnosis not present

## 2019-06-04 DIAGNOSIS — M1120 Other chondrocalcinosis, unspecified site: Secondary | ICD-10-CM | POA: Diagnosis not present

## 2019-06-04 DIAGNOSIS — M65831 Other synovitis and tenosynovitis, right forearm: Secondary | ICD-10-CM | POA: Diagnosis not present

## 2019-06-04 DIAGNOSIS — M19041 Primary osteoarthritis, right hand: Secondary | ICD-10-CM | POA: Diagnosis not present

## 2019-06-04 DIAGNOSIS — M7989 Other specified soft tissue disorders: Secondary | ICD-10-CM | POA: Diagnosis not present

## 2019-06-04 DIAGNOSIS — R2231 Localized swelling, mass and lump, right upper limb: Secondary | ICD-10-CM | POA: Diagnosis not present

## 2019-06-04 DIAGNOSIS — M25541 Pain in joints of right hand: Secondary | ICD-10-CM | POA: Diagnosis not present

## 2019-06-08 ENCOUNTER — Emergency Department: Payer: PPO

## 2019-06-08 ENCOUNTER — Encounter: Payer: Self-pay | Admitting: Emergency Medicine

## 2019-06-08 ENCOUNTER — Other Ambulatory Visit: Payer: Self-pay

## 2019-06-08 DIAGNOSIS — R0789 Other chest pain: Secondary | ICD-10-CM | POA: Insufficient documentation

## 2019-06-08 DIAGNOSIS — I129 Hypertensive chronic kidney disease with stage 1 through stage 4 chronic kidney disease, or unspecified chronic kidney disease: Secondary | ICD-10-CM | POA: Insufficient documentation

## 2019-06-08 DIAGNOSIS — R079 Chest pain, unspecified: Secondary | ICD-10-CM | POA: Diagnosis not present

## 2019-06-08 DIAGNOSIS — R778 Other specified abnormalities of plasma proteins: Secondary | ICD-10-CM | POA: Insufficient documentation

## 2019-06-08 DIAGNOSIS — N189 Chronic kidney disease, unspecified: Secondary | ICD-10-CM | POA: Insufficient documentation

## 2019-06-08 DIAGNOSIS — Z79899 Other long term (current) drug therapy: Secondary | ICD-10-CM | POA: Insufficient documentation

## 2019-06-08 DIAGNOSIS — I2510 Atherosclerotic heart disease of native coronary artery without angina pectoris: Secondary | ICD-10-CM | POA: Diagnosis not present

## 2019-06-08 DIAGNOSIS — Z7982 Long term (current) use of aspirin: Secondary | ICD-10-CM | POA: Insufficient documentation

## 2019-06-08 LAB — BASIC METABOLIC PANEL
Anion gap: 10 (ref 5–15)
BUN: 29 mg/dL — ABNORMAL HIGH (ref 8–23)
CO2: 22 mmol/L (ref 22–32)
Calcium: 9.4 mg/dL (ref 8.9–10.3)
Chloride: 105 mmol/L (ref 98–111)
Creatinine, Ser: 1.75 mg/dL — ABNORMAL HIGH (ref 0.61–1.24)
GFR calc Af Amer: 39 mL/min — ABNORMAL LOW (ref >60)
GFR calc non Af Amer: 34 mL/min — ABNORMAL LOW (ref >60)
Glucose, Bld: 98 mg/dL (ref 70–99)
Potassium: 3.9 mmol/L (ref 3.5–5.1)
Sodium: 137 mmol/L (ref 135–145)

## 2019-06-08 LAB — TROPONIN I (HIGH SENSITIVITY): Troponin I (High Sensitivity): 30 ng/L — ABNORMAL HIGH (ref <18)

## 2019-06-08 MED ORDER — SODIUM CHLORIDE 0.9% FLUSH: 3.0000 mL | Freq: Once | INTRAVENOUS | Status: DC

## 2019-06-08 NOTE — ED Triage Notes (Addendum)
Pt presents to ED with sharp left sided chest pain. Denies radiating pain or sob. No hx of the same. Pt reports having one episode of pain on Saturday that lasted approx <1 min and several times again tonight starting around 1830. Pt has no increased work of breathing or acute distress noted. Skin warm and dry. Pt states he was moving things out of his attic last week and feels like he really pulled something in his left shoulder.

## 2019-06-09 ENCOUNTER — Emergency Department
Admission: EM | Admit: 2019-06-09 | Discharge: 2019-06-09 | Disposition: A | Discharge status: Home / Self Care | Payer: PPO | Attending: Emergency Medicine | Admitting: Emergency Medicine

## 2019-06-09 ENCOUNTER — Encounter: Payer: Self-pay | Admitting: Emergency Medicine

## 2019-06-09 DIAGNOSIS — R778 Other specified abnormalities of plasma proteins: Secondary | ICD-10-CM

## 2019-06-09 DIAGNOSIS — N189 Chronic kidney disease, unspecified: Secondary | ICD-10-CM

## 2019-06-09 DIAGNOSIS — R079 Chest pain, unspecified: Secondary | ICD-10-CM

## 2019-06-09 HISTORY — DX: Pure hypercholesterolemia, unspecified: E78.00

## 2019-06-09 LAB — CBC
HCT: 38.4 % — ABNORMAL LOW (ref 39.0–52.0)
Hemoglobin: 12.7 g/dL — ABNORMAL LOW (ref 13.0–17.0)
MCH: 29.7 pg (ref 26.0–34.0)
MCHC: 33.1 g/dL (ref 30.0–36.0)
MCV: 89.7 fL (ref 80.0–100.0)
Platelets: 97 10*3/uL — ABNORMAL LOW (ref 150–400)
RBC: 4.28 MIL/uL (ref 4.22–5.81)
RDW: 16.4 % — ABNORMAL HIGH (ref 11.5–15.5)
WBC: 5.8 10*3/uL (ref 4.0–10.5)
nRBC: 0 % (ref 0.0–0.2)

## 2019-06-09 LAB — TROPONIN I (HIGH SENSITIVITY): Troponin I (High Sensitivity): 29 ng/L — ABNORMAL HIGH (ref <18)

## 2019-06-09 NOTE — ED Provider Notes (Signed)
[facility] Emergency Department Provider Note  ____________________________________________   First MD Initiated Contact with Patient 06/09/19 [phone]     (approximate)  I have reviewed the triage vital signs and the nursing notes.   HISTORY  Chief Complaint Chest Pain    HPI Aaron Burton is a 86 y.o. male with medical history as listed below who presents for evaluation of intermittent left-sided sharp chest pain that is mild to moderate in severity and has been present for the last 2 to 3 days.  He said that it started after he thinks he strained something while lifting some boxes the day before.  He can reproduce the pain with moving his left arm and shoulder in a certain way and the pain is located just to the side and below his left breast.  He has had no shortness of breath.  He denies cough, nausea, vomiting, fever/chills, abdominal pain, sweating episodes, dizziness, difficulty with ambulation, focal numbness and weakness.  He has no history of cardiac disease although he does have hypertension and hypercholesterolemia.  He has a primary care physician and takes a daily baby aspirin but he does not have a cardiologist.  Nothing in particular makes the symptoms better and they are reproducible as described above.  He is able to push mow his lawn and generally does not have any issues with shortness of breath or chest pain.         Past Medical History:  Diagnosis Date  . Coronary artery disease   . Gout   . Hypercholesterolemia   . Hypertension     Patient Active Problem List   Diagnosis Date Noted  . Angioedema 07/09/2018    Past Surgical History:  Procedure Laterality Date  . ACHILLES TENDON REPAIR    . CAROTID ARTERY ANGIOPLASTY    . COLON SURGERY    . LEG SURGERY    . LITHOTRIPSY      Prior to Admission medications   Medication Sig Start Date End Date Taking? Authorizing Provider  amLODipine (NORVASC) 5 MG tablet Take 1 tablet by  mouth daily. 02/18/17   [provider]  aspirin EC 81 MG tablet Take 1 tablet by mouth daily.    [provider]  Cholecalciferol 125 MCG (5000 UT) TABS Take 5,000 Units by mouth 2 (two) times a week.    [provider]  colchicine 0.6 MG tablet Take 1 tablet (0.6 mg total) by mouth daily as needed (gout). 07/10/18   Fritzi Mandes, MD  diphenhydrAMINE (BENADRYL) 25 mg capsule Take 1 capsule (25 mg total) by mouth 3 (three) times daily for 5 days. 07/10/18 07/15/18  Fritzi Mandes, MD  famotidine (PEPCID) 10 MG tablet Take 1 tablet (10 mg total) by mouth 2 (two) times daily. 07/10/18   Fritzi Mandes, MD  lidocaine (LIDODERM) 5 % Place 1 patch onto the skin every 12 (twelve) hours. Remove & Discard patch within 12 hours or as directed by MD 10/05/18 10/05/19  Merlyn Lot, MD  Multiple Vitamin (MULTI-VITAMINS) TABS Take 1 tablet by mouth daily.    [provider]  predniSONE (DELTASONE) 10 MG tablet Take 50 mg daily --taper by 10 mg daily then stop 07/10/18   Fritzi Mandes, MD  senna-docusate (VEGETABLE LAX+STOOL SOFTENER) 8.6-50 MG tablet Take 0.5 tablets by mouth every evening.    [provider]  simvastatin (ZOCOR) 20 MG tablet Take 20 mg by mouth every evening.     [provider]  traMADol Veatrice Bourbon)  50 MG tablet Take 1 tablet (50 mg total) by mouth every 12 (twelve) hours as needed for severe pain. 10/05/18 10/05/19  Merlyn Lot, MD  triamcinolone cream (KENALOG) 0.1 % Apply 1 application topically 2 (two) times daily. 05/19/18   [provider]    Allergies Ace inhibitors  Family History  Problem Relation Age of Onset  . Stroke Mother   . Hypertension Father   . Heart disease Father     Social History Social History   Tobacco Use  . Smoking status: Never Smoker  . Smokeless tobacco: Never Used  Substance Use Topics  . Alcohol use: No  . Drug use: No    Review of Systems Constitutional: No fever/chills Eyes: No visual  changes. ENT: No sore throat. Cardiovascular: +chest pain. Respiratory: Denies shortness of breath. Gastrointestinal: No abdominal pain.  No nausea, no vomiting.  No diarrhea.  No constipation. Genitourinary: Negative for dysuria. Musculoskeletal: Negative for neck pain.  Negative for back pain. Integumentary: Negative for rash. Neurological: Negative for headaches, focal weakness or numbness.   ____________________________________________   PHYSICAL EXAM:  VITAL SIGNS: ED Triage Vitals  Enc Vitals Group     BP 06/08/19 2225 (!) 174/72     Pulse Rate 06/08/19 2225 87     Resp 06/08/19 2225 18     Temp 06/08/19 2225 98.4 F (36.9 C)     Temp Source 06/08/19 2225 Oral     SpO2 06/08/19 2225 98 %     Weight 06/08/19 2226 77.1 kg (170 lb)     Height 06/08/19 2226 1.676 m (5\' 6" )     Head Circumference --      Peak Flow --      Pain Score 06/08/19 2224 0     Pain Loc --      Pain Edu? --      Excl. in [place]? --     Constitutional: Alert and oriented.  Generally well-appearing and healthy looking for his age. Eyes: Conjunctivae are normal.  Head: Atraumatic. Nose: No congestion/rhinnorhea. Mouth/Throat: Patient is wearing a mask. Neck: No stridor.  No meningeal signs.   Cardiovascular: Normal rate, regular rhythm. Good peripheral circulation. Grossly normal heart sounds. Respiratory: Normal respiratory effort.  No retractions. Gastrointestinal: Soft and nontender. No distention.  Musculoskeletal: No lower extremity tenderness nor edema. No gross deformities of extremities.  No reproducible chest wall tenderness to palpation but the patient is able to reproduce the pain by moving and straining with his left arm and shoulder. Neurologic:  Normal speech and language. No gross focal neurologic deficits are appreciated.  Skin:  Skin is warm, dry and intact. Psychiatric: Mood and affect are normal. Speech and behavior are normal.  ____________________________________________     LABS (all labs ordered are listed, but only abnormal results are displayed)  Labs Reviewed  BASIC METABOLIC PANEL - Abnormal; Notable for the following components:      Result Value   BUN 29 (*)    Creatinine, Ser 1.75 (*)    GFR calc non Af Amer 34 (*)    GFR calc Af Amer 39 (*)    All other components within normal limits  CBC - Abnormal; Notable for the following components:   Hemoglobin 12.7 (*)    HCT 38.4 (*)    RDW 16.4 (*)    Platelets 97 (*)    All other components within normal limits  TROPONIN I (HIGH SENSITIVITY) - Abnormal; Notable for the following components:   Troponin I (  High Sensitivity) 30 (*)    All other components within normal limits  TROPONIN I (HIGH SENSITIVITY) - Abnormal; Notable for the following components:   Troponin I (High Sensitivity) 29 (*)    All other components within normal limits   ____________________________________________  EKG  ED ECG REPORT I, Hinda Kehr, the attending physician, personally viewed and interpreted this ECG.  Date: 06/08/2019 EKG Time: 22: 20 Rate: 87 Rhythm: Sinus rhythm with first-degree AV block QRS Axis: normal Intervals: PR interval is 264 ms ST/T Wave abnormalities: Non-specific ST segment / T-wave changes, but no clear evidence of acute ischemia. Narrative Interpretation: no definitive evidence of acute ischemia; does not meet STEMI criteria.   ____________________________________________  RADIOLOGY I, Hinda Kehr, personally viewed and evaluated these images (plain radiographs) as part of my medical decision making, as well as reviewing the written report by the radiologist.  ED MD interpretation: No acute abnormalities on chest x-ray.  Official radiology report(s): DG Chest 2 View  Result Date: 06/08/2019 CLINICAL DATA:  Chest pain EXAM: CHEST - 2 VIEW COMPARISON:  10/05/2018 FINDINGS: No significant pleural effusion. Streaky basilar atelectasis. Mild cardiomegaly with aortic atherosclerosis.  No pneumothorax. Chronic compression deformity of approximate L2 vertebral body. IMPRESSION: No active cardiopulmonary disease.  Cardiomegaly. Electronically Signed   By: Donavan Foil M.D.   On: 06/08/2019 22:56    ____________________________________________   PROCEDURES   Procedure(s) performed (including Critical Care):  Procedures   ____________________________________________   INITIAL IMPRESSION / MDM / ASSESSMENT AND PLAN / ED COURSE  As part of my medical decision making, I reviewed the following data within the [place] History obtained from family, Nursing notes reviewed and incorporated, Labs reviewed , EKG interpreted , Old chart reviewed, Radiograph reviewed  and Notes from prior ED visits   Differential diagnosis includes, but is not limited to, musculoskeletal pain, stable angina, ACS including unstable angina, pneumonia, pneumothorax, COVID-19, PE, AAS.  The patient has only a couple of risk factors and is generally healthy for his age.  Vital signs are stable other than hypertension.  He has been waiting for almost 8 hours in the emergency department is not currently having chest pain although he can reproduce it with certain movements of his arm.  His work-up including EKG, chest x-ray, and labs are reassuring.  Although his troponin is slightly elevated at about 30, it is consistent over a repeat and I think that it is elevated due to his chronic kidney disease which is roughly at baseline.  Looking back through his records he is also had elevated troponin levels in the past.  I had an extensive conversation with him and his daughter-in-law who is at bedside about the chest pain and various plans that we could use.  I explained that I could admit him for further chest pain evaluation but I feel that his risk of ACS is extremely low and that he would benefit more from outpatient follow-up then from a chest pain observation stay in the hospital.  He and  his daughter-in-law agree with this plan.  I have given follow-up information with Dr. Clayborn Bigness and he will call today for a follow-up appointment.  He will continue taking his medications including his daily aspirin.  I gave my usual and customary return precautions.         ____________________________________________  FINAL CLINICAL IMPRESSION(S) / ED DIAGNOSES  Final diagnoses:  Left-sided chest pain  Chronic kidney disease, unspecified CKD stage  Elevated troponin level  MEDICATIONS GIVEN DURING THIS VISIT:  Medications - No data to display   ED Discharge Orders    None      *Please note:  Aaron Burton was evaluated in Emergency Department on 06/09/2019 for the symptoms described in the history of present illness. He was evaluated in the context of the global COVID-19 pandemic, which necessitated consideration that the patient might be at risk for infection with the SARS-CoV-2 virus that causes COVID-19. Institutional protocols and algorithms that pertain to the evaluation of patients at risk for COVID-19 are in a state of rapid change based on information released by regulatory bodies including the CDC and federal and state organizations. These policies and algorithms were followed during the patient's care in the ED.  Some ED evaluations and interventions may be delayed as a result of limited staffing during the pandemic.*  Note:  This document was prepared using Dragon voice recognition software and may include unintentional dictation errors.   Hinda Kehr, MD 06/09/19 [phone]

## 2019-06-09 NOTE — Discharge Instructions (Signed)
You have been seen in the Emergency Department (ED) today for chest pain.  As we have discussed today's test results are reassuring, but you may require further testing.  Although your troponin level is slightly elevated, it is not going up over time, and I believe it is most likely elevated due to your chronic kidney disease, not to a new problem with your heart.  Please follow up with the recommended doctor as instructed above in these documents regarding today's emergent visit and your recent symptoms to discuss further management.  Continue to take your regular medications. If you are not doing so already, please also take a daily baby aspirin (81 mg), at least until you follow up with your doctor.  Return to the Emergency Department (ED) if you experience any further chest pain/pressure/tightness, difficulty breathing, or sudden sweating, or other symptoms that concern you.

## 2019-06-10 DIAGNOSIS — E7800 Pure hypercholesterolemia, unspecified: Secondary | ICD-10-CM | POA: Diagnosis not present

## 2019-06-10 DIAGNOSIS — I10 Essential (primary) hypertension: Secondary | ICD-10-CM | POA: Diagnosis not present

## 2019-06-10 DIAGNOSIS — R9431 Abnormal electrocardiogram [ECG] [EKG]: Secondary | ICD-10-CM | POA: Diagnosis not present

## 2019-06-10 DIAGNOSIS — I208 Other forms of angina pectoris: Secondary | ICD-10-CM | POA: Diagnosis not present

## 2019-08-04 DIAGNOSIS — D485 Neoplasm of uncertain behavior of skin: Secondary | ICD-10-CM | POA: Diagnosis not present

## 2019-08-04 DIAGNOSIS — L821 Other seborrheic keratosis: Secondary | ICD-10-CM | POA: Diagnosis not present

## 2019-08-04 DIAGNOSIS — D2271 Melanocytic nevi of right lower limb, including hip: Secondary | ICD-10-CM | POA: Diagnosis not present

## 2019-08-04 DIAGNOSIS — X32XXXA Exposure to sunlight, initial encounter: Secondary | ICD-10-CM | POA: Diagnosis not present

## 2019-08-04 DIAGNOSIS — Z85828 Personal history of other malignant neoplasm of skin: Secondary | ICD-10-CM | POA: Diagnosis not present

## 2019-08-04 DIAGNOSIS — L570 Actinic keratosis: Secondary | ICD-10-CM | POA: Diagnosis not present

## 2019-08-04 DIAGNOSIS — D225 Melanocytic nevi of trunk: Secondary | ICD-10-CM | POA: Diagnosis not present

## 2019-08-04 DIAGNOSIS — D2261 Melanocytic nevi of right upper limb, including shoulder: Secondary | ICD-10-CM | POA: Diagnosis not present

## 2019-08-04 DIAGNOSIS — D2262 Melanocytic nevi of left upper limb, including shoulder: Secondary | ICD-10-CM | POA: Diagnosis not present

## 2019-08-04 DIAGNOSIS — C44319 Basal cell carcinoma of skin of other parts of face: Secondary | ICD-10-CM | POA: Diagnosis not present

## 2019-08-04 DIAGNOSIS — C44612 Basal cell carcinoma of skin of right upper limb, including shoulder: Secondary | ICD-10-CM | POA: Diagnosis not present

## 2019-08-13 DIAGNOSIS — I129 Hypertensive chronic kidney disease with stage 1 through stage 4 chronic kidney disease, or unspecified chronic kidney disease: Secondary | ICD-10-CM | POA: Diagnosis not present

## 2019-08-13 DIAGNOSIS — E1122 Type 2 diabetes mellitus with diabetic chronic kidney disease: Secondary | ICD-10-CM | POA: Diagnosis not present

## 2019-08-13 DIAGNOSIS — R809 Proteinuria, unspecified: Secondary | ICD-10-CM | POA: Diagnosis not present

## 2019-08-13 DIAGNOSIS — N2581 Secondary hyperparathyroidism of renal origin: Secondary | ICD-10-CM | POA: Diagnosis not present

## 2019-08-13 DIAGNOSIS — N1832 Chronic kidney disease, stage 3b: Secondary | ICD-10-CM | POA: Diagnosis not present

## 2019-08-19 DIAGNOSIS — N2581 Secondary hyperparathyroidism of renal origin: Secondary | ICD-10-CM | POA: Diagnosis not present

## 2019-08-19 DIAGNOSIS — I129 Hypertensive chronic kidney disease with stage 1 through stage 4 chronic kidney disease, or unspecified chronic kidney disease: Secondary | ICD-10-CM | POA: Diagnosis not present

## 2019-08-19 DIAGNOSIS — E1122 Type 2 diabetes mellitus with diabetic chronic kidney disease: Secondary | ICD-10-CM | POA: Diagnosis not present

## 2019-08-19 DIAGNOSIS — N1832 Chronic kidney disease, stage 3b: Secondary | ICD-10-CM | POA: Diagnosis not present

## 2019-08-19 DIAGNOSIS — R809 Proteinuria, unspecified: Secondary | ICD-10-CM | POA: Diagnosis not present

## 2019-09-15 DIAGNOSIS — C44319 Basal cell carcinoma of skin of other parts of face: Secondary | ICD-10-CM | POA: Diagnosis not present

## 2019-09-22 DIAGNOSIS — C44612 Basal cell carcinoma of skin of right upper limb, including shoulder: Secondary | ICD-10-CM | POA: Diagnosis not present

## 2019-09-28 DIAGNOSIS — R7303 Prediabetes: Secondary | ICD-10-CM | POA: Diagnosis not present

## 2019-09-28 DIAGNOSIS — N1832 Chronic kidney disease, stage 3b: Secondary | ICD-10-CM | POA: Diagnosis not present

## 2019-09-28 DIAGNOSIS — D696 Thrombocytopenia, unspecified: Secondary | ICD-10-CM | POA: Diagnosis not present

## 2019-09-28 DIAGNOSIS — M109 Gout, unspecified: Secondary | ICD-10-CM | POA: Diagnosis not present

## 2019-09-28 DIAGNOSIS — E7800 Pure hypercholesterolemia, unspecified: Secondary | ICD-10-CM | POA: Diagnosis not present

## 2019-09-28 DIAGNOSIS — D638 Anemia in other chronic diseases classified elsewhere: Secondary | ICD-10-CM | POA: Diagnosis not present

## 2019-10-05 DIAGNOSIS — E7800 Pure hypercholesterolemia, unspecified: Secondary | ICD-10-CM | POA: Diagnosis not present

## 2019-10-05 DIAGNOSIS — Z0000 Encounter for general adult medical examination without abnormal findings: Secondary | ICD-10-CM | POA: Diagnosis not present

## 2019-10-05 DIAGNOSIS — R7303 Prediabetes: Secondary | ICD-10-CM | POA: Diagnosis not present

## 2019-10-05 DIAGNOSIS — D638 Anemia in other chronic diseases classified elsewhere: Secondary | ICD-10-CM | POA: Diagnosis not present

## 2019-10-05 DIAGNOSIS — Z23 Encounter for immunization: Secondary | ICD-10-CM | POA: Diagnosis not present

## 2019-10-05 DIAGNOSIS — N1831 Chronic kidney disease, stage 3a: Secondary | ICD-10-CM | POA: Diagnosis not present

## 2019-10-05 DIAGNOSIS — D696 Thrombocytopenia, unspecified: Secondary | ICD-10-CM | POA: Diagnosis not present

## 2019-10-05 DIAGNOSIS — I10 Essential (primary) hypertension: Secondary | ICD-10-CM | POA: Diagnosis not present

## 2019-10-09 ENCOUNTER — Other Ambulatory Visit: Payer: Self-pay | Admitting: Nephrology

## 2019-10-09 ENCOUNTER — Ambulatory Visit: Payer: PPO

## 2019-10-09 DIAGNOSIS — N1832 Chronic kidney disease, stage 3b: Secondary | ICD-10-CM

## 2019-10-28 DIAGNOSIS — S9031XA Contusion of right foot, initial encounter: Secondary | ICD-10-CM | POA: Diagnosis not present

## 2019-10-28 DIAGNOSIS — S90121A Contusion of right lesser toe(s) without damage to nail, initial encounter: Secondary | ICD-10-CM | POA: Diagnosis not present

## 2019-10-28 DIAGNOSIS — M10072 Idiopathic gout, left ankle and foot: Secondary | ICD-10-CM | POA: Diagnosis not present

## 2019-10-28 DIAGNOSIS — E119 Type 2 diabetes mellitus without complications: Secondary | ICD-10-CM | POA: Diagnosis not present

## 2019-12-26 ENCOUNTER — Other Ambulatory Visit: Payer: Self-pay

## 2019-12-26 ENCOUNTER — Encounter: Payer: Self-pay | Admitting: Emergency Medicine

## 2019-12-26 ENCOUNTER — Ambulatory Visit
Admission: EM | Admit: 2019-12-26 | Discharge: 2019-12-26 | Disposition: A | Discharge status: Home / Self Care | Payer: PPO | Attending: Physician Assistant | Admitting: Physician Assistant

## 2019-12-26 DIAGNOSIS — M79672 Pain in left foot: Secondary | ICD-10-CM

## 2019-12-26 DIAGNOSIS — M109 Gout, unspecified: Secondary | ICD-10-CM | POA: Diagnosis not present

## 2019-12-26 MED ORDER — PREDNISONE 10 MG PO TABS
ORAL_TABLET | ORAL | 0 refills | Status: DC
Start: 2019-12-26 — End: 2020-03-02

## 2019-12-26 NOTE — ED Triage Notes (Signed)
Patient in today c/o left foot pain x 1 day. Patient has a history of gout. Patient has taken OTC Tylenol without relief.

## 2019-12-26 NOTE — ED Provider Notes (Signed)
[facility]    CSN: [id number] Arrival date & time: 12/26/19  0830      History   Chief Complaint Chief Complaint  Patient presents with  . Foot Pain    left    HPI Aaron Burton is a 86 y.o. male for suspected gout flareup of the left great toe since yesterday.  He states he has had some swelling, redness and tenderness about the first MTP joint.  Admits to a history of gout with frequent flareups.  He has taken Tylenol for pain without relief.  Patient states he has chronic kidney disease so he cannot take NSAIDs or colchicine.  He states that he was taking allopurinol but his PCP took him off it and did not really explain why.  He states that he had been advised by other providers that allopurinol could help.  He states that his PCP was trying a get him started on Uloric, but he has not received a prescription for that yet to try.  Patient states he tries to watch his diet and avoid any purine containing foods as much as possible.  He states that when he gets flareups which is a few times per year prednisone really helped symptoms pretty much daily think he can take due to his chronic kidney disease.  He denies any fever, fatigue, achiness, skin lesions.  There is been no injury to the area.  He denies any other concerns or complaints today.  HPI  Past Medical History:  Diagnosis Date  . Coronary artery disease   . Gout   . Hypercholesterolemia   . Hypertension     Patient Active Problem List   Diagnosis Date Noted  . Angioedema 07/09/2018    Past Surgical History:  Procedure Laterality Date  . ACHILLES TENDON REPAIR    . CAROTID ARTERY ANGIOPLASTY    . COLON SURGERY    . LEG SURGERY    . LITHOTRIPSY         Home Medications    Prior to Admission medications   Medication Sig Start Date End Date Taking? Authorizing Provider  amLODipine (NORVASC) 5 MG tablet Take 1 tablet by mouth daily. 02/18/17  Yes [provider]  aspirin EC 81 MG tablet  Take 1 tablet by mouth daily.   Yes [provider]  Cholecalciferol 125 MCG (5000 UT) TABS Take 5,000 Units by mouth 2 (two) times a week.   Yes [provider]  Multiple Vitamin (MULTI-VITAMINS) TABS Take 1 tablet by mouth daily.   Yes [provider]  senna-docusate (VEGETABLE LAX+STOOL SOFTENER) 8.6-50 MG tablet Take 0.5 tablets by mouth every evening.   Yes [provider]  simvastatin (ZOCOR) 20 MG tablet Take 20 mg by mouth every evening.    Yes [provider]  diphenhydrAMINE (BENADRYL) 25 mg capsule Take 1 capsule (25 mg total) by mouth 3 (three) times daily for 5 days. 07/10/18 07/15/18  Fritzi Mandes, MD  famotidine (PEPCID) 10 MG tablet Take 1 tablet (10 mg total) by mouth 2 (two) times daily. 07/10/18   Fritzi Mandes, MD  predniSONE (DELTASONE) 10 MG tablet Take 6 tablets by mouth the first day and decrease by 1 tablet daily for the next 5 days 12/26/19   Laurene Footman B, PA-C  triamcinolone cream (KENALOG) 0.1 % Apply 1 application topically 2 (two) times daily. 05/19/18   [provider]  colchicine 0.6 MG tablet Take 1 tablet (0.6 mg total) by mouth daily as needed (gout).  07/10/18 12/26/19  Fritzi Mandes, MD    Family History Family History  Problem Relation Age of Onset  . Stroke Mother   . Hypertension Father   . Heart disease Father     Social History Social History   Tobacco Use  . Smoking status: Never Smoker  . Smokeless tobacco: Never Used  Vaping Use  . Vaping Use: Never used  Substance Use Topics  . Alcohol use: No  . Drug use: No     Allergies   Ace inhibitors   Review of Systems Review of Systems  Constitutional: Negative for fatigue and fever.  Musculoskeletal: Positive for arthralgias, gait problem and joint swelling.  Skin: Positive for color change. Negative for rash and wound.  Neurological: Negative for weakness and numbness.     Physical Exam Triage Vital Signs ED Triage Vitals  Enc Vitals  Group     BP 12/26/19 0840 (!) 145/85     Pulse Rate 12/26/19 0840 93     Resp 12/26/19 0840 18     Temp 12/26/19 0840 98 F (36.7 C)     Temp Source 12/26/19 0840 Oral     SpO2 12/26/19 0840 100 %     Weight 12/26/19 0841 170 lb (77.1 kg)     Height 12/26/19 0841 5\' 7"  (1.702 m)     Head Circumference --      Peak Flow --      Pain Score 12/26/19 0840 8     Pain Loc --      Pain Edu? --      Excl. in [place]? --    No data found.  Updated Vital Signs BP (!) 145/85 (BP Location: Left Arm)   Pulse 93   Temp 98 F (36.7 C) (Oral)   Resp 18   Ht 5\' 7"  (1.702 m)   Wt 170 lb (77.1 kg)   SpO2 100%   BMI 26.63 kg/m       Physical Exam Vitals and nursing note reviewed.  Constitutional:      General: He is not in acute distress.    Appearance: Normal appearance. He is well-developed. He is not ill-appearing or toxic-appearing.  HENT:     Head: Normocephalic and atraumatic.  Eyes:     General: No scleral icterus.    Conjunctiva/sclera: Conjunctivae normal.  Cardiovascular:     Rate and Rhythm: Normal rate.     Pulses: Normal pulses.  Pulmonary:     Effort: Pulmonary effort is normal. No respiratory distress.  Musculoskeletal:     Cervical back: Neck supple.     Left foot: Normal range of motion. Swelling (moderate swelling, erythema and warmth 1st MTP joint. Area is tender to palpation) and bony tenderness present. No deformity. Normal pulse.  Skin:    General: Skin is warm and dry.  Neurological:     General: No focal deficit present.     Mental Status: He is alert. Mental status is at baseline.     Motor: No weakness.     Gait: Gait normal.  Psychiatric:        Mood and Affect: Mood normal.        Behavior: Behavior normal.        Thought Content: Thought content normal.      UC Treatments / Results  Labs (all labs ordered are listed, but only abnormal results are displayed) Labs Reviewed - No data to display  EKG   Radiology No results  found.  Procedures  Procedures (including critical care time)  Medications Ordered in UC Medications - No data to display  Initial Impression / Assessment and Plan / UC Course  I have reviewed the triage vital signs and the nursing notes.  Pertinent labs & imaging results that were available during my care of the patient were reviewed by me and considered in my medical decision making (see chart for details).    89-old male presenting for gout flareup.  He does have swelling, erythema and warmth to the left first MTP joint.  This area is also tender.  This does fit with acute gout flareup.  Vital signs are stable.  He is afebrile.  Treating with prednisone and Tylenol at this point.  Also advised rest, ice and elevation.  Advised him to make an appoint with his PCP or podiatrist to talk about getting started on a prophylactic medication.  Patient agreeable.  Advised him he can return here as needed for any new or worsening symptoms.   Final Clinical Impressions(s) / UC Diagnoses   Final diagnoses:  Podagra  Left foot pain     Discharge Instructions     Take prednisone.  Also take Tylenol for pain relief.  Ice area and elevate the foot.  Get appointment with your PCP once this gout flareup is over so they can test the uric acid level in your blood and see if there is a medication right for you that you can take to prevent flareups.    ED Prescriptions    Medication Sig Dispense Auth. Provider   predniSONE (DELTASONE) 10 MG tablet Take 6 tablets by mouth the first day and decrease by 1 tablet daily for the next 5 days 21 tablet Danton Clap, PA-C     PDMP not reviewed this encounter.   Danton Clap, PA-C 12/26/19 [phone]

## 2019-12-26 NOTE — Discharge Instructions (Signed)
Take prednisone.  Also take Tylenol for pain relief.  Ice area and elevate the foot.  Get appointment with your PCP once this gout flareup is over so they can test the uric acid level in your blood and see if there is a medication right for you that you can take to prevent flareups.

## 2020-01-24 ENCOUNTER — Encounter: Payer: Self-pay | Admitting: Emergency Medicine

## 2020-01-24 ENCOUNTER — Emergency Department
Admission: EM | Admit: 2020-01-24 | Discharge: 2020-01-24 | Disposition: A | Discharge status: Home / Self Care | Payer: PPO | Attending: Emergency Medicine | Admitting: Emergency Medicine

## 2020-01-24 ENCOUNTER — Emergency Department: Payer: PPO

## 2020-01-24 ENCOUNTER — Other Ambulatory Visit: Payer: Self-pay

## 2020-01-24 DIAGNOSIS — Z7982 Long term (current) use of aspirin: Secondary | ICD-10-CM | POA: Diagnosis not present

## 2020-01-24 DIAGNOSIS — I2510 Atherosclerotic heart disease of native coronary artery without angina pectoris: Secondary | ICD-10-CM | POA: Insufficient documentation

## 2020-01-24 DIAGNOSIS — Z79899 Other long term (current) drug therapy: Secondary | ICD-10-CM | POA: Insufficient documentation

## 2020-01-24 DIAGNOSIS — R0789 Other chest pain: Secondary | ICD-10-CM | POA: Diagnosis not present

## 2020-01-24 DIAGNOSIS — I10 Essential (primary) hypertension: Secondary | ICD-10-CM | POA: Diagnosis not present

## 2020-01-24 DIAGNOSIS — R079 Chest pain, unspecified: Secondary | ICD-10-CM | POA: Diagnosis not present

## 2020-01-24 DIAGNOSIS — I517 Cardiomegaly: Secondary | ICD-10-CM | POA: Diagnosis not present

## 2020-01-24 DIAGNOSIS — Z20822 Contact with and (suspected) exposure to covid-19: Secondary | ICD-10-CM | POA: Diagnosis not present

## 2020-01-24 DIAGNOSIS — J90 Pleural effusion, not elsewhere classified: Secondary | ICD-10-CM | POA: Diagnosis not present

## 2020-01-24 DIAGNOSIS — J811 Chronic pulmonary edema: Secondary | ICD-10-CM | POA: Diagnosis not present

## 2020-01-24 LAB — URINALYSIS, COMPLETE (UACMP) WITH MICROSCOPIC
Bacteria, UA: NONE SEEN
Bilirubin Urine: NEGATIVE
Glucose, UA: NEGATIVE mg/dL
Hgb urine dipstick: NEGATIVE
Ketones, ur: NEGATIVE mg/dL
Leukocytes,Ua: NEGATIVE
Nitrite: NEGATIVE
Protein, ur: 100 mg/dL — AB
Specific Gravity, Urine: 1.02 (ref 1.005–1.030)
pH: 6 (ref 5.0–8.0)

## 2020-01-24 LAB — BASIC METABOLIC PANEL
Anion gap: 10 (ref 5–15)
BUN: 29 mg/dL — ABNORMAL HIGH (ref 8–23)
CO2: 23 mmol/L (ref 22–32)
Calcium: 9.3 mg/dL (ref 8.9–10.3)
Chloride: 103 mmol/L (ref 98–111)
Creatinine, Ser: 1.44 mg/dL — ABNORMAL HIGH (ref 0.61–1.24)
GFR, Estimated: 46 mL/min — ABNORMAL LOW (ref >60)
Glucose, Bld: 127 mg/dL — ABNORMAL HIGH (ref 70–99)
Potassium: 4.2 mmol/L (ref 3.5–5.1)
Sodium: 136 mmol/L (ref 135–145)

## 2020-01-24 LAB — CBC
HCT: 36.7 % — ABNORMAL LOW (ref 39.0–52.0)
Hemoglobin: 12.4 g/dL — ABNORMAL LOW (ref 13.0–17.0)
MCH: 30 pg (ref 26.0–34.0)
MCHC: 33.8 g/dL (ref 30.0–36.0)
MCV: 88.6 fL (ref 80.0–100.0)
Platelets: 96 10*3/uL — ABNORMAL LOW (ref 150–400)
RBC: 4.14 MIL/uL — ABNORMAL LOW (ref 4.22–5.81)
RDW: 16.7 % — ABNORMAL HIGH (ref 11.5–15.5)
WBC: 10.4 10*3/uL (ref 4.0–10.5)
nRBC: 0 % (ref 0.0–0.2)

## 2020-01-24 LAB — RESP PANEL BY RT-PCR (FLU A&B, COVID) ARPGX2
Influenza A by PCR: NEGATIVE
Influenza B by PCR: NEGATIVE
SARS Coronavirus 2 by RT PCR: NEGATIVE

## 2020-01-24 LAB — PROCALCITONIN: Procalcitonin: <0.1 ng/mL

## 2020-01-24 LAB — LACTIC ACID, PLASMA: Lactic Acid, Venous: 0.9 mmol/L (ref 0.5–1.9)

## 2020-01-24 LAB — TROPONIN I (HIGH SENSITIVITY)
Troponin I (High Sensitivity): 30 ng/L — ABNORMAL HIGH (ref <18)
Troponin I (High Sensitivity): 35 ng/L — ABNORMAL HIGH (ref <18)

## 2020-01-24 IMAGING — CR DG CHEST 2V
3 series · 3 of 3 positions shown · non-contrast
Comparison: Radiograph [DATE], CT [DATE]

CLINICAL DATA: Chest pain, pain in left neck and shoulder

EXAM:
CHEST - 2 VIEW

[chest pa]
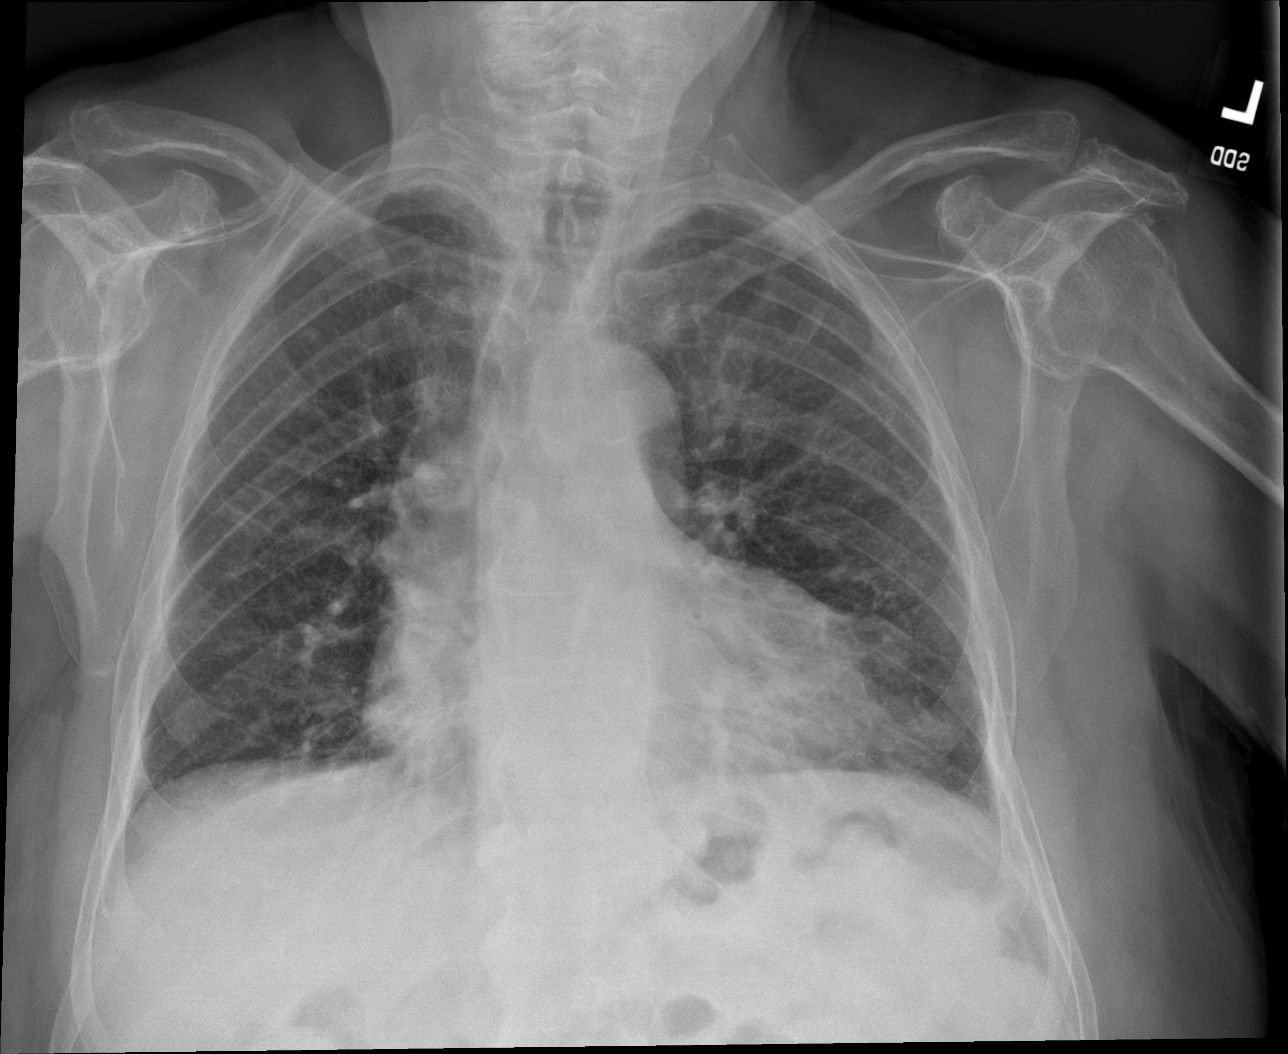

[chest lat (1 of 2)]
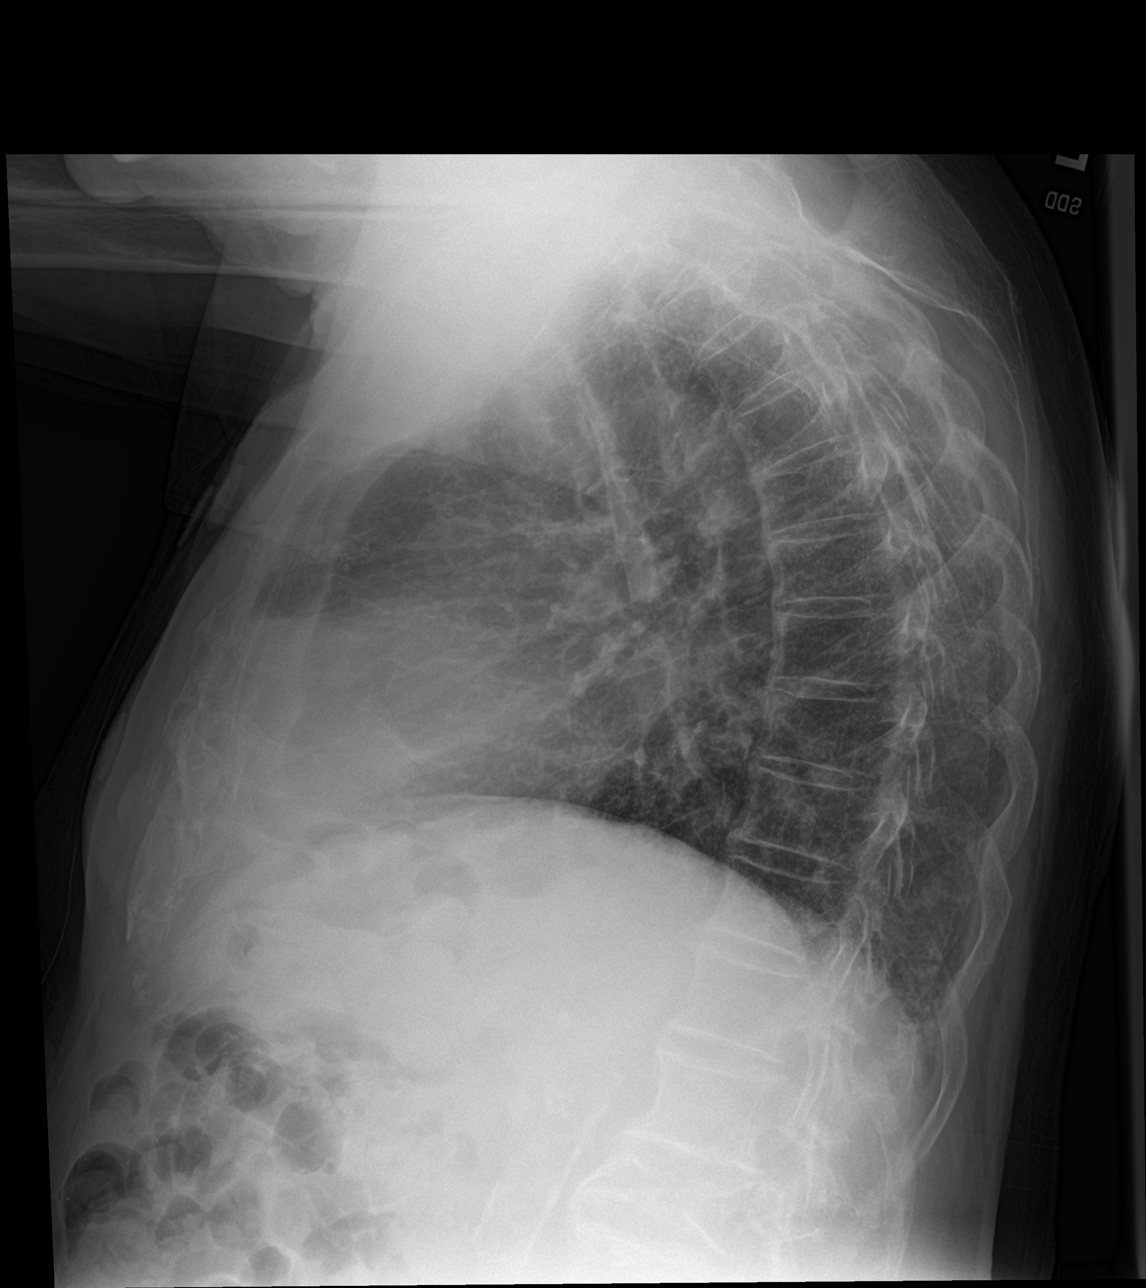

[chest lat (2 of 2)]
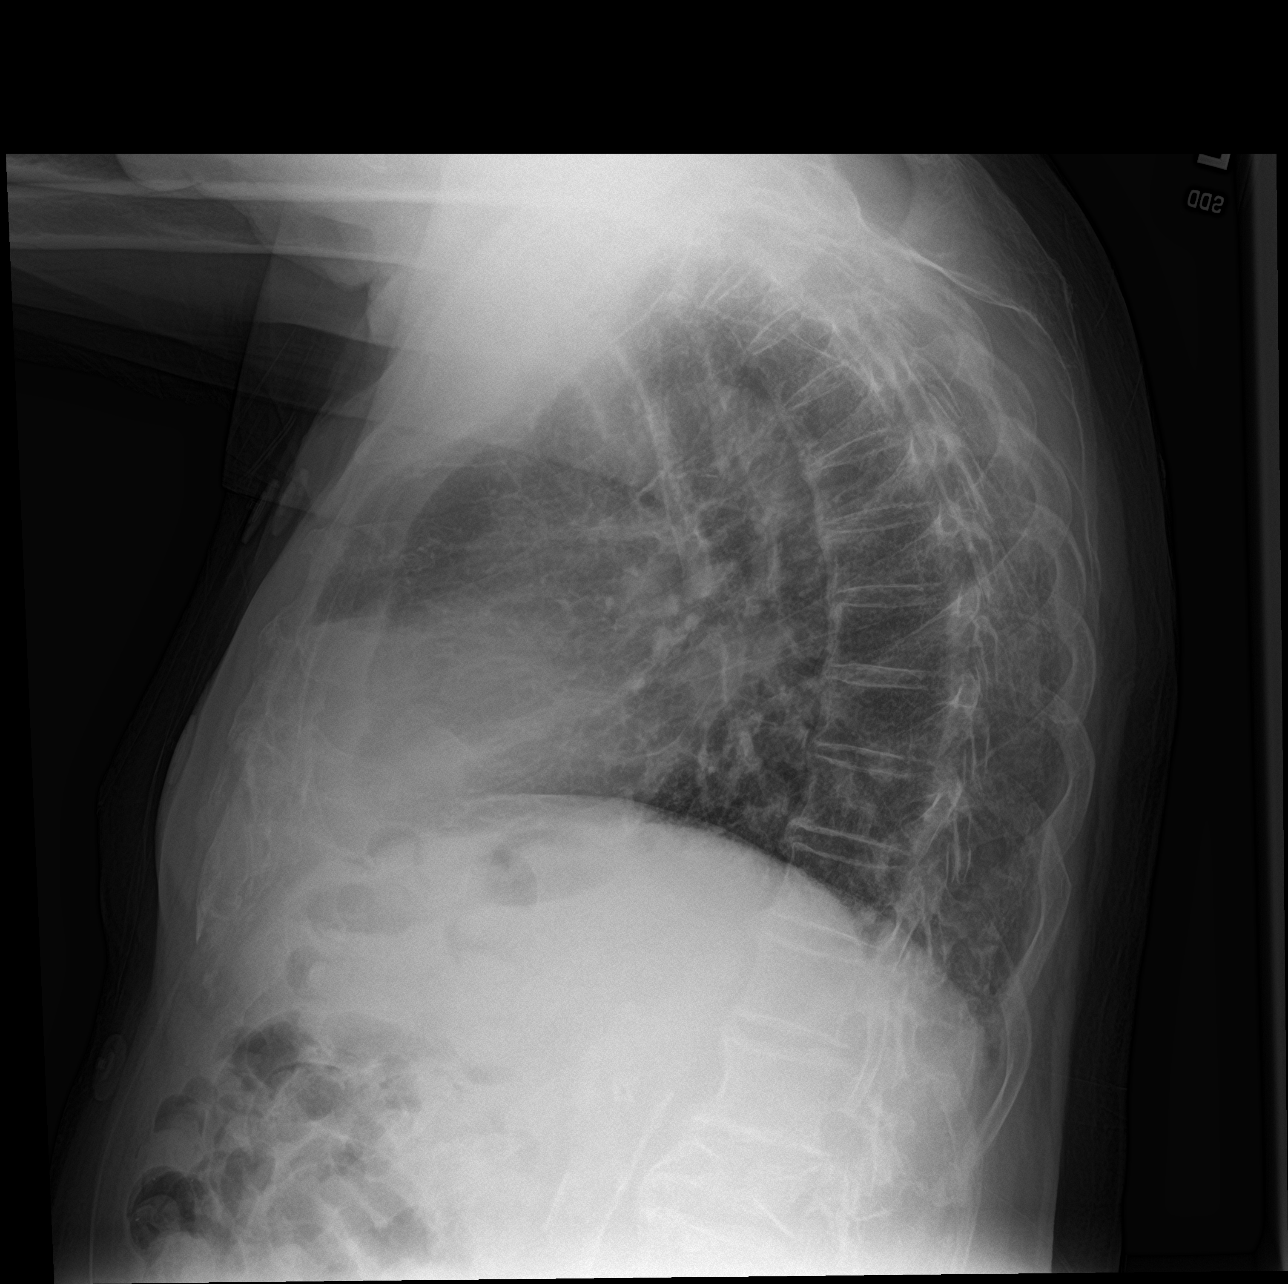

[3 of 3 positions shown; findings below may reference images not displayed]

FINDINGS: Areas mixed patchy interstitial and airspace opacity are present
throughout both lungs most coalescent in the retrocardiac space. No
pneumothorax or visible effusion. Slight pulmonary vascular
congestion. The aorta is calcified. The remaining cardiomediastinal
contours are unremarkable. No acute osseous or soft tissue
abnormality. The osseous structures appear diffusely demineralized
which may limit detection of small or nondisplaced fractures.
degenerative changes are present in the imaged spine and shoulders.
IMPRESSION: Areas of mixed interstitial and airspace opacity throughout both
lungs most coalescent in the retrocardiac space/left lower lobe.
Findings could reflect developing edema versus atypical infection.

## 2020-01-24 IMAGING — CT CT ANGIO CHEST
2 of 6 series · 17 of 46 positions shown · IV contrast (APPLIED)
Comparison: Radiograph [DATE], CT [DATE]

CLINICAL DATA: PE suspected, pain in the left neck and shoulder

EXAM:
CT ANGIOGRAPHY CHEST WITH CONTRAST
TECHNIQUE: Multidetector CT imaging of the chest was performed using the
standard protocol during bolus administration of intravenous
contrast. Multiplanar CT image reconstructions and MIPs were
obtained to evaluate the vascular anatomy.
CONTRAST:  75mL OMNIPAQUE IOHEXOL 350 MG/ML SOLN

[Series 5: thins · axial · 0.67mm/px · z∈[-710,-484]mm · 14 of 247 slices shown]
[im 11/247  lung]
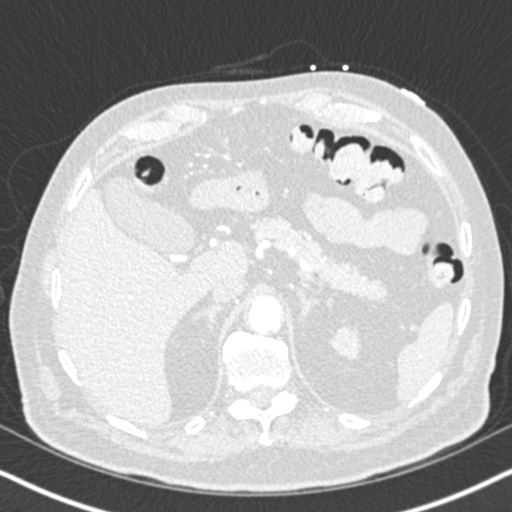
[im 33/247  soft-tissue]
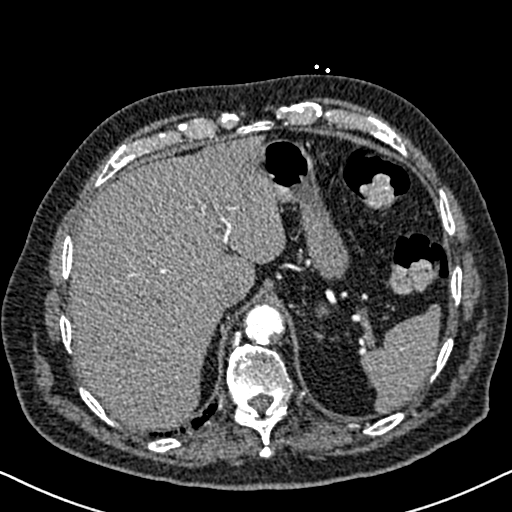
[im 43/247  lung]
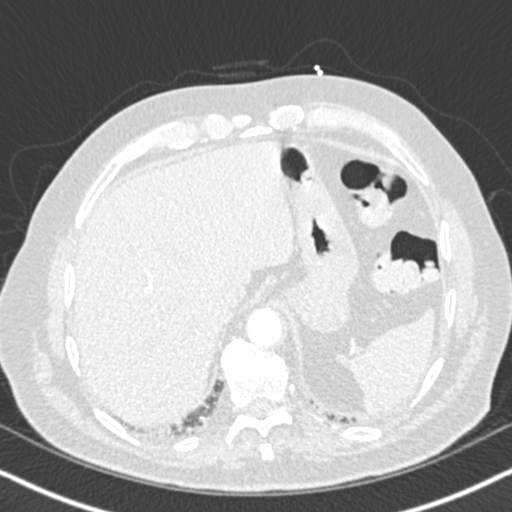
[im 65/247  soft-tissue]
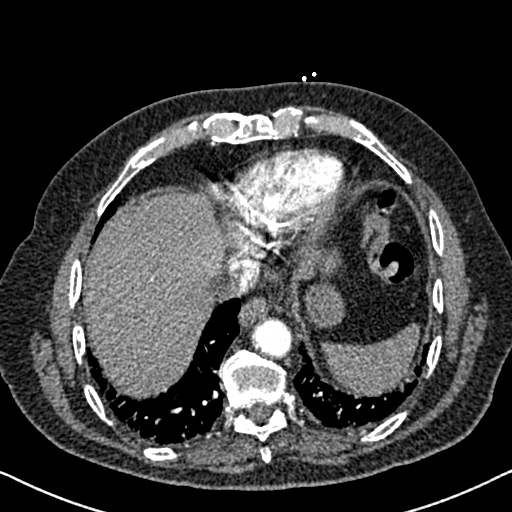
[im 86/247  lung]
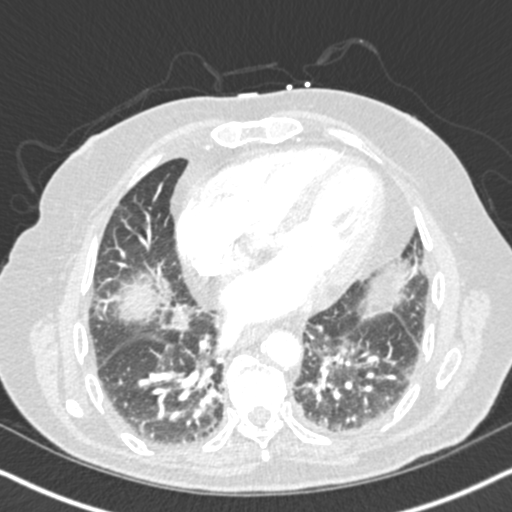
[im 97/247  soft-tissue]
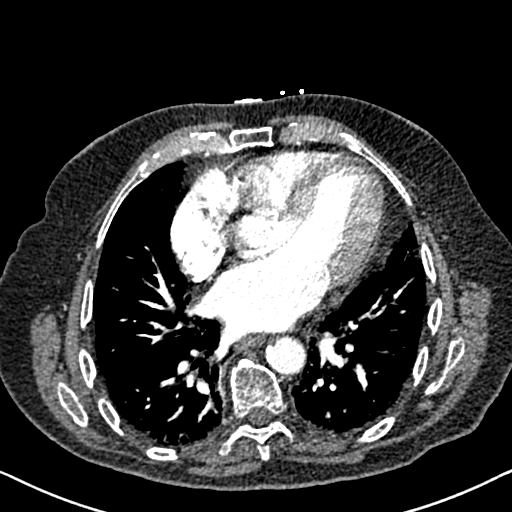
[im 118/247  lung]
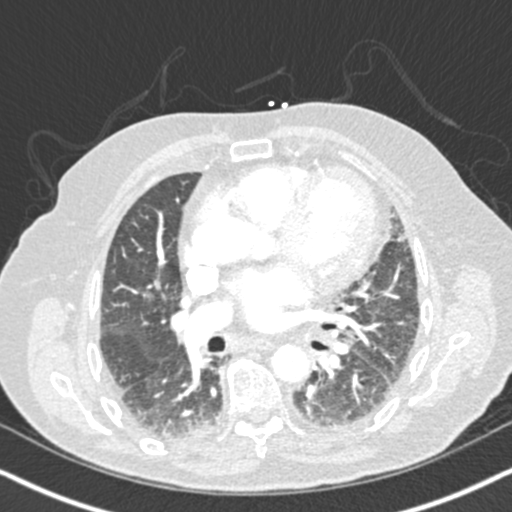
[im 129/247  soft-tissue]
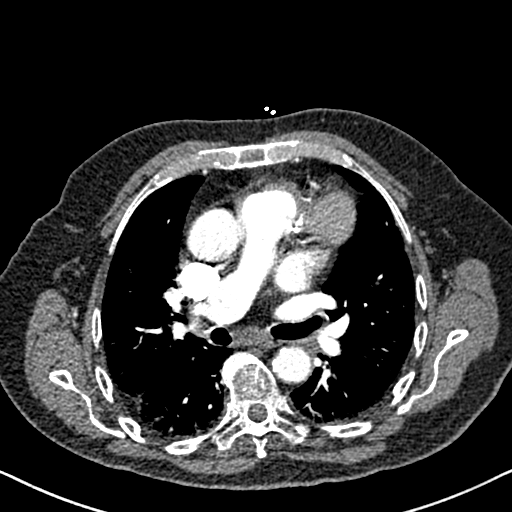
[im 150/247  lung]
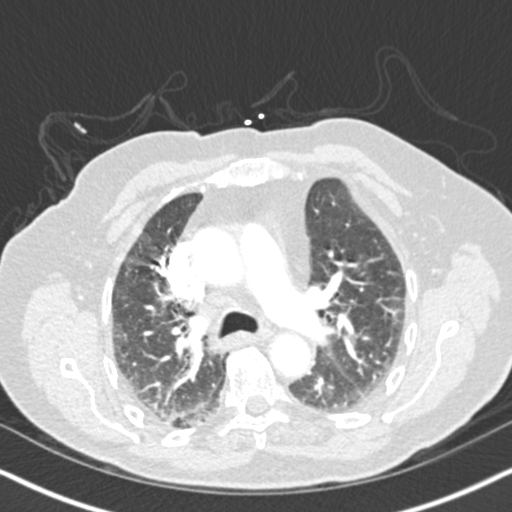
[im 161/247  soft-tissue]
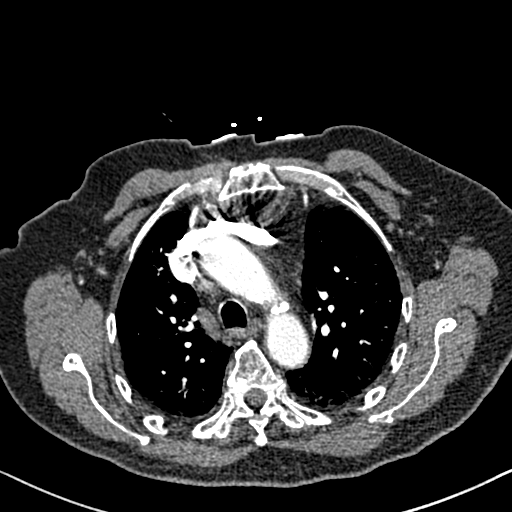
[im 182/247  lung]
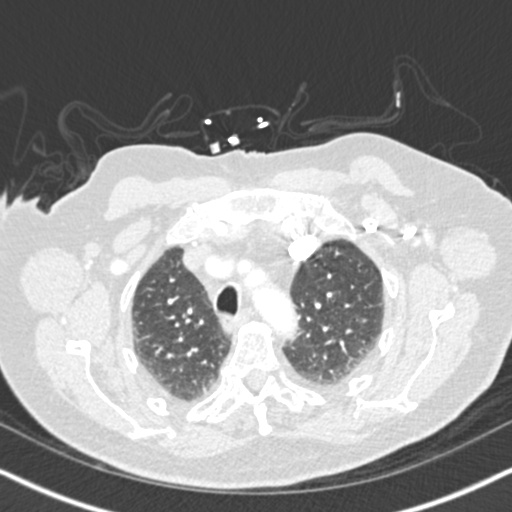
[im 204/247  soft-tissue]
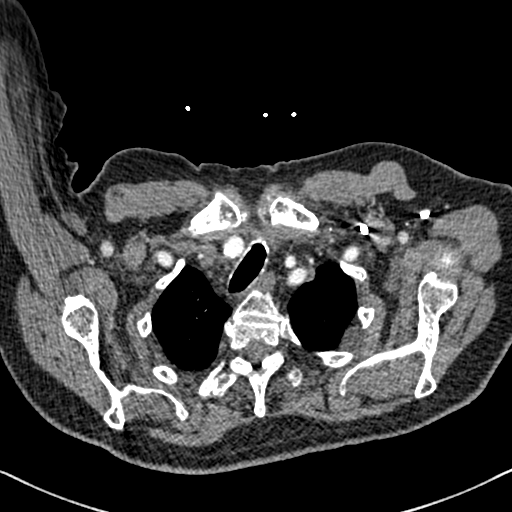
[im 214/247  lung]
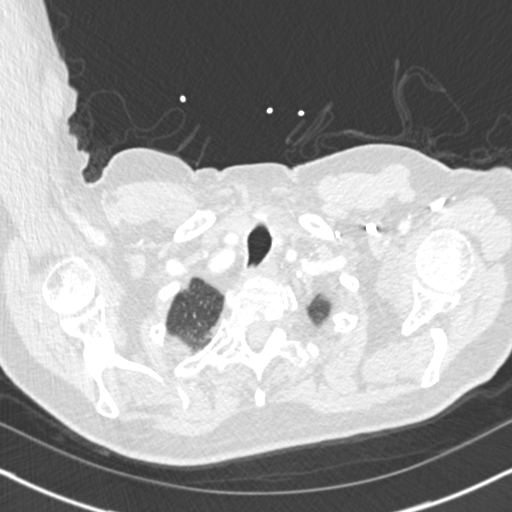
[im 236/247  soft-tissue]
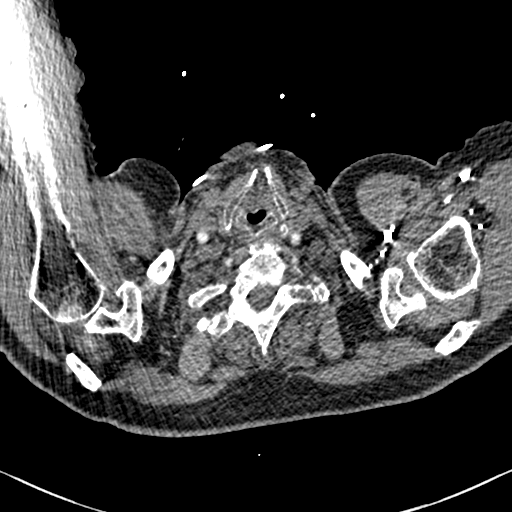

[Series 7: coronal mpr · coronal · 0.49mm/px · 3 of 92 slices shown]
[im 23/92  soft-tissue]
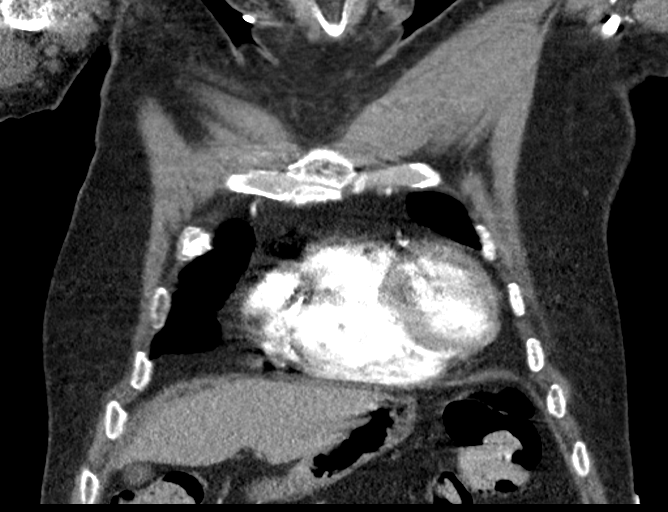
[im 46/92  soft-tissue]
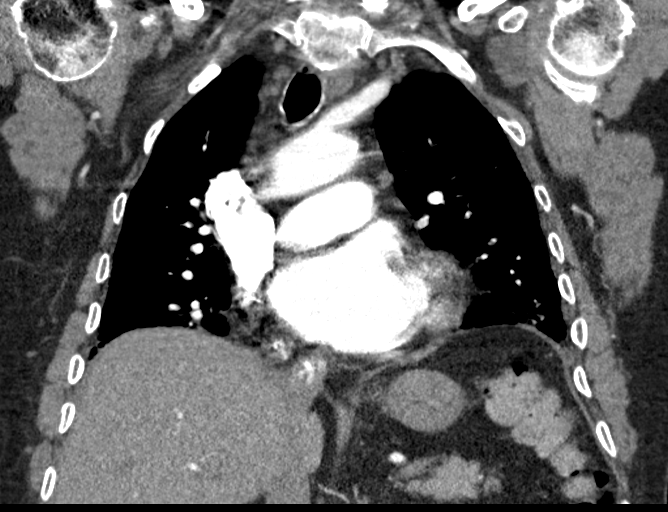
[im 69/92  soft-tissue]
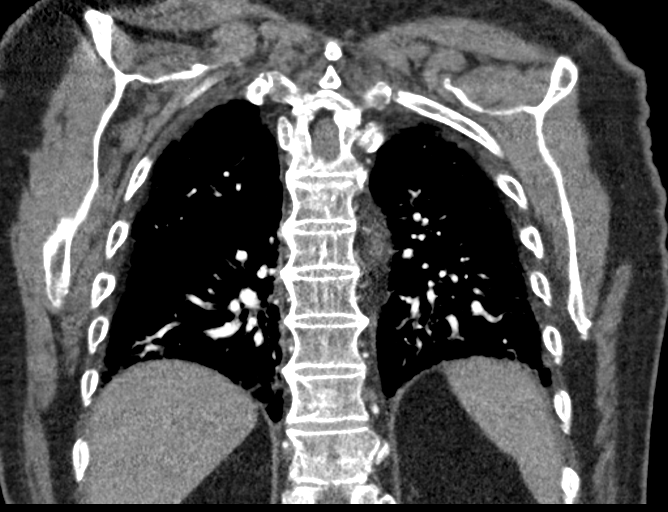

[17 of 46 positions shown; findings below may reference images not displayed]

FINDINGS: Cardiovascular: Satisfactory opacification the pulmonary arteries to
the segmental level. No pulmonary artery filling defects are
identified. Central pulmonary arteries are normal caliber.
Cardiomegaly. Three-vessel coronary artery atherosclerosis.
Calcifications of the aortic leaflets. Atherosclerotic plaque within
the normal caliber aorta. No acute luminal abnormality of the imaged
aorta. No periaortic stranding or hemorrhage. Normal 3 vessel
branching of the aortic arch. Proximal great vessels are
unremarkable.

Mediastinum/Nodes: No mediastinal fluid or gas. Normal thyroid gland
and thoracic inlet. No acute abnormality of the trachea or
esophagus. No worrisome mediastinal, hilar or axillary adenopathy.

Lungs/Pleura: Low lung volumes with extensive atelectatic changes.
Few scattered punctate calcified granulomata are present. More
coalescent opacity in the retrocardiac space seen on comparison
radiography is favored to reflect more pronounced atelectatic volume
loss in the left lower lobe without focal consolidative opacity.
There is some mild septal thickening towards lung bases with slight
vascular redistribution and fissural thickening as well. No
pneumothorax or effusion.

Upper Abdomen: Nonspecific a left perinephric stranding, possibly
related to age or diminished renal function. No other acute
abnormalities present in the visualized portions of the upper
abdomen.

Musculoskeletal: Exaggerated thoracic kyphosis. Multilevel flowing
anterior osteophytosis, compatible with features of diffuse
idiopathic skeletal hyperostosis (DISH). Additional degenerative
changes in the shoulders. No acute osseous abnormality or suspicious
osseous lesion. No worrisome chest wall lesions. Mild bilateral
gynecomastia.

Review of the MIP images confirms the above findings.
IMPRESSION: 1. No evidence of acute pulmonary embolism.
2. Low lung volumes with extensive atelectatic changes.
3. More coalescent opacity in the retrocardiac space seen on
comparison radiography is favored to reflect more pronounced
atelectatic volume loss in the left lower lobe without focal
consolidative opacity.
4. Cardiomegaly with coronary artery atherosclerosis.
5. Features of mild interstitial pulmonary edema.
6. Nonspecific left perinephric stranding, possibly related to age
or diminished renal function. Consider correlation for urinary
symptoms with urinalysis if present to exclude ascending tract
infection.
7. Aortic Atherosclerosis ([SF]-[SF]).

## 2020-01-24 MED ORDER — ONDANSETRON HCL 4 MG/2ML IJ SOLN
4.0000 mg | Freq: Once | INTRAMUSCULAR | Status: AC
  Administered 2020-01-24: 22:00:00 4 mg via INTRAVENOUS
  Filled 2020-01-24: qty 2

## 2020-01-24 MED ORDER — HYDROCODONE-ACETAMINOPHEN 5-325 MG PO TABS
1.0000 | ORAL_TABLET | Freq: Once | ORAL | Status: AC
  Administered 2020-01-24: 23:00:00 1 via ORAL
  Filled 2020-01-24: qty 1

## 2020-01-24 MED ORDER — HYDROCODONE-ACETAMINOPHEN 5-325 MG PO TABS: 1.0000 | ORAL_TABLET | Freq: Four times a day (QID) | ORAL | 0 refills | Status: DC | PRN

## 2020-01-24 MED ORDER — MORPHINE SULFATE (PF) 2 MG/ML IV SOLN
2.0000 mg | Freq: Once | INTRAVENOUS | Status: AC
  Administered 2020-01-24: 22:00:00 2 mg via INTRAVENOUS
  Filled 2020-01-24: qty 1

## 2020-01-24 MED ORDER — IOHEXOL 350 MG/ML SOLN
75.0000 mL | Freq: Once | INTRAVENOUS | Status: AC | PRN
  Administered 2020-01-24: 21:00:00 75 mL via INTRAVENOUS

## 2020-01-24 MED ORDER — HYDROCODONE-ACETAMINOPHEN 5-325 MG PO TABS: 1.0000 | ORAL_TABLET | Freq: Four times a day (QID) | ORAL | 0 refills | Status: AC | PRN

## 2020-01-24 NOTE — ED Provider Notes (Signed)
[facility] Emergency Department Provider Note   ____________________________________________    I have reviewed the triage vital signs and the nursing notes.   HISTORY  Chief Complaint Chest Pain     HPI Aaron Burton is a 86 y.o. male with a history of CAD hypertension hypercholesterolemia who presents with complaints of pain in his left chest wall.  Patient reports yesterday he had pain in his left neck/shoulder and then today it seemed to move down into his left lateral chest.  He reports it is tender to palpation there.  The pain worsens if he moves.  He denies rash.  No injury to the area.  No fevers chills or cough.  Has been vaccinated gets COVID-19.  Past Medical History:  Diagnosis Date  . Coronary artery disease   . Gout   . Hypercholesterolemia   . Hypertension     Patient Active Problem List   Diagnosis Date Noted  . Angioedema 07/09/2018    Past Surgical History:  Procedure Laterality Date  . ACHILLES TENDON REPAIR    . CAROTID ARTERY ANGIOPLASTY    . COLON SURGERY    . LEG SURGERY    . LITHOTRIPSY      Prior to Admission medications   Medication Sig Start Date End Date Taking? Authorizing Provider  amLODipine (NORVASC) 5 MG tablet Take 1 tablet by mouth daily. 02/18/17   [provider]  aspirin EC 81 MG tablet Take 1 tablet by mouth daily.    [provider]  Cholecalciferol 125 MCG (5000 UT) TABS Take 5,000 Units by mouth 2 (two) times a week.    [provider]  diphenhydrAMINE (BENADRYL) 25 mg capsule Take 1 capsule (25 mg total) by mouth 3 (three) times daily for 5 days. 07/10/18 07/15/18  Fritzi Mandes, MD  famotidine (PEPCID) 10 MG tablet Take 1 tablet (10 mg total) by mouth 2 (two) times daily. 07/10/18   Fritzi Mandes, MD  HYDROcodone-acetaminophen (NORCO/VICODIN) 5-325 MG tablet Take 1 tablet by mouth every 6 (six) hours as needed for severe pain. 01/24/20   Lavonia Drafts, MD  Multiple Vitamin  (MULTI-VITAMINS) TABS Take 1 tablet by mouth daily.    [provider]  predniSONE (DELTASONE) 10 MG tablet Take 6 tablets by mouth the first day and decrease by 1 tablet daily for the next 5 days 12/26/19   Laurene Footman B, PA-C  senna-docusate (VEGETABLE LAX+STOOL SOFTENER) 8.6-50 MG tablet Take 0.5 tablets by mouth every evening.    [provider]  simvastatin (ZOCOR) 20 MG tablet Take 20 mg by mouth every evening.     [provider]  triamcinolone cream (KENALOG) 0.1 % Apply 1 application topically 2 (two) times daily. 05/19/18   [provider]  colchicine 0.6 MG tablet Take 1 tablet (0.6 mg total) by mouth daily as needed (gout). 07/10/18 12/26/19  Fritzi Mandes, MD     Allergies Ace inhibitors  Family History  Problem Relation Age of Onset  . Stroke Mother   . Hypertension Father   . Heart disease Father     Social History Social History   Tobacco Use  . Smoking status: Never Smoker  . Smokeless tobacco: Never Used  Vaping Use  . Vaping Use: Never used  Substance Use Topics  . Alcohol use: No  . Drug use: No    Review of Systems  Constitutional: No fever/chills Eyes: No visual changes.  ENT: No sore throat. Cardiovascular: As above Respiratory: Denies  shortness of breath. Gastrointestinal: No abdominal pain.  Genitourinary: Negative for dysuria. Musculoskeletal: Negative for back pain. Skin: Negative for rash. Neurological: Negative for headaches    ____________________________________________   PHYSICAL EXAM:  VITAL SIGNS: ED Triage Vitals  Enc Vitals Group     BP 01/24/20 1953 (!) 150/63     Pulse Rate 01/24/20 1953 95     Resp 01/24/20 1953 18     Temp 01/24/20 1953 99 F (37.2 C)     Temp Source 01/24/20 1953 Oral     SpO2 01/24/20 1953 97 %     Weight 01/24/20 1954 74.8 kg (165 lb)     Height 01/24/20 1954 1.676 m (5\' 6" )     Head Circumference --      Peak Flow --      Pain Score 01/24/20 1953 6     Pain  Loc --      Pain Edu? --      Excl. in [place]? --     Constitutional: Alert and oriented. Eyes: Conjunctivae are normal.   Nose: No congestion/rhinnorhea. Mouth/Throat: Mucous membranes are moist.   Neck:  Painless ROM Cardiovascular: Normal rate, regular rhythm. Grossly normal heart sounds.  Good peripheral circulation.  Left chest wall, tenderness palpation approximately fifth rib laterally, no rash bruising or swelling Respiratory: Normal respiratory effort.  No retractions. Lungs CTAB. Gastrointestinal: Soft and nontender. No distention.  No CVA tenderness.  Musculoskeletal: No lower extremity tenderness nor edema.  Warm and well perfused Neurologic:  Normal speech and language. No gross focal neurologic deficits are appreciated.  Skin:  Skin is warm, dry and intact. No rash noted. Psychiatric: Mood and affect are normal. Speech and behavior are normal.  ____________________________________________   LABS (all labs ordered are listed, but only abnormal results are displayed)  Labs Reviewed  BASIC METABOLIC PANEL - Abnormal; Notable for the following components:      Result Value   Glucose, Bld 127 (*)    BUN 29 (*)    Creatinine, Ser 1.44 (*)    GFR, Estimated 46 (*)    All other components within normal limits  CBC - Abnormal; Notable for the following components:   RBC 4.14 (*)    Hemoglobin 12.4 (*)    HCT 36.7 (*)    RDW 16.7 (*)    Platelets 96 (*)    All other components within normal limits  URINALYSIS, COMPLETE (UACMP) WITH MICROSCOPIC - Abnormal; Notable for the following components:   Color, Urine STRAW (*)    APPearance CLEAR (*)    Protein, ur 100 (*)    All other components within normal limits  TROPONIN I (HIGH SENSITIVITY) - Abnormal; Notable for the following components:   Troponin I (High Sensitivity) 30 (*)    All other components within normal limits  RESP PANEL BY RT-PCR (FLU A&B, COVID) ARPGX2  CULTURE, BLOOD (SINGLE)  PROCALCITONIN  LACTIC  ACID, PLASMA  LACTIC ACID, PLASMA  TROPONIN I (HIGH SENSITIVITY)   ____________________________________________  EKG  ED ECG REPORT I, Lavonia Drafts, the attending physician, personally viewed and interpreted this ECG.  Date: 01/24/2020  Rhythm: normal sinus rhythm QRS Axis: normal Intervals: First-degree AV block ST/T Wave abnormalities: Nonspecific changes Narrative Interpretation: no evidence of acute ischemia  ____________________________________________  RADIOLOGY  Chest x-ray viewed by me, questionable infiltrate CT angiography without clear pneumonia, likely atelectasis, no PE ____________________________________________   PROCEDURES  Procedure(s) performed: No  Procedures   Critical Care performed: No ____________________________________________  INITIAL IMPRESSION / ASSESSMENT AND PLAN / ED COURSE  Pertinent labs & imaging results that were available during my care of the patient were reviewed by me and considered in my medical decision making (see chart for details).  Patient presents with left-sided chest pain as described above.  He describes it as an arthritis like pain although it seemed to worsen today, did improve with Advil but he reports it is more severe than typical arthritis pain.  No cough or fever however chest x-ray suspicious for pneumonia which would certainly explain his pain  No evidence of herpetic rash, certainly could be musculoskeletal pain.  Given chest x-ray sent for CT angiography which was overall reassuring, not consistent with pneumonia.  Procalcitonin and lactic acid are normal as well.  Patient's troponin is chronically elevated.  This is unchanged from prior.  Treated with low-dose morphine and Zofran with improvement in pain.  Patient feeling better after treatments although still having some pain.  Offered admission however he would prefer to go home with pain medications.  He knows he can return anytime if any  worsening.  He will follow up closely with his PCP and return to the ED if any changes    ____________________________________________   FINAL CLINICAL IMPRESSION(S) / ED DIAGNOSES  Final diagnoses:  Atypical chest pain  Chest wall pain        Note:  This document was prepared using Dragon voice recognition software and may include unintentional dictation errors.   Lavonia Drafts, MD 01/24/20 2249

## 2020-01-24 NOTE — ED Triage Notes (Signed)
Patient states that he started having pain in his left neck and shoulder. Patient states that today the pain has moved down into the left side of his chest. Patient denies shortness of breath, nausea or vomiting.

## 2020-01-27 DIAGNOSIS — M461 Sacroiliitis, not elsewhere classified: Secondary | ICD-10-CM | POA: Diagnosis not present

## 2020-01-27 DIAGNOSIS — M5451 Vertebrogenic low back pain: Secondary | ICD-10-CM | POA: Diagnosis not present

## 2020-01-27 DIAGNOSIS — M9904 Segmental and somatic dysfunction of sacral region: Secondary | ICD-10-CM | POA: Diagnosis not present

## 2020-01-27 DIAGNOSIS — M9903 Segmental and somatic dysfunction of lumbar region: Secondary | ICD-10-CM | POA: Diagnosis not present

## 2020-01-28 DIAGNOSIS — D638 Anemia in other chronic diseases classified elsewhere: Secondary | ICD-10-CM | POA: Diagnosis not present

## 2020-01-28 DIAGNOSIS — M461 Sacroiliitis, not elsewhere classified: Secondary | ICD-10-CM | POA: Diagnosis not present

## 2020-01-28 DIAGNOSIS — R7303 Prediabetes: Secondary | ICD-10-CM | POA: Diagnosis not present

## 2020-01-28 DIAGNOSIS — M5451 Vertebrogenic low back pain: Secondary | ICD-10-CM | POA: Diagnosis not present

## 2020-01-28 DIAGNOSIS — I10 Essential (primary) hypertension: Secondary | ICD-10-CM | POA: Diagnosis not present

## 2020-01-28 DIAGNOSIS — M9904 Segmental and somatic dysfunction of sacral region: Secondary | ICD-10-CM | POA: Diagnosis not present

## 2020-01-28 DIAGNOSIS — M9903 Segmental and somatic dysfunction of lumbar region: Secondary | ICD-10-CM | POA: Diagnosis not present

## 2020-01-28 DIAGNOSIS — E7800 Pure hypercholesterolemia, unspecified: Secondary | ICD-10-CM | POA: Diagnosis not present

## 2020-01-28 DIAGNOSIS — D696 Thrombocytopenia, unspecified: Secondary | ICD-10-CM | POA: Diagnosis not present

## 2020-01-29 DIAGNOSIS — M9904 Segmental and somatic dysfunction of sacral region: Secondary | ICD-10-CM | POA: Diagnosis not present

## 2020-01-29 DIAGNOSIS — M461 Sacroiliitis, not elsewhere classified: Secondary | ICD-10-CM | POA: Diagnosis not present

## 2020-01-29 DIAGNOSIS — M9903 Segmental and somatic dysfunction of lumbar region: Secondary | ICD-10-CM | POA: Diagnosis not present

## 2020-01-29 DIAGNOSIS — M5451 Vertebrogenic low back pain: Secondary | ICD-10-CM | POA: Diagnosis not present

## 2020-02-03 DIAGNOSIS — M461 Sacroiliitis, not elsewhere classified: Secondary | ICD-10-CM | POA: Diagnosis not present

## 2020-02-03 DIAGNOSIS — M9904 Segmental and somatic dysfunction of sacral region: Secondary | ICD-10-CM | POA: Diagnosis not present

## 2020-02-03 DIAGNOSIS — M9903 Segmental and somatic dysfunction of lumbar region: Secondary | ICD-10-CM | POA: Diagnosis not present

## 2020-02-03 DIAGNOSIS — M5451 Vertebrogenic low back pain: Secondary | ICD-10-CM | POA: Diagnosis not present

## 2020-02-03 LAB — CULTURE, BLOOD (SINGLE): Culture: NO GROWTH

## 2020-02-04 DIAGNOSIS — D638 Anemia in other chronic diseases classified elsewhere: Secondary | ICD-10-CM | POA: Diagnosis not present

## 2020-02-04 DIAGNOSIS — E7800 Pure hypercholesterolemia, unspecified: Secondary | ICD-10-CM | POA: Diagnosis not present

## 2020-02-04 DIAGNOSIS — D696 Thrombocytopenia, unspecified: Secondary | ICD-10-CM | POA: Diagnosis not present

## 2020-02-04 DIAGNOSIS — R7303 Prediabetes: Secondary | ICD-10-CM | POA: Diagnosis not present

## 2020-02-04 DIAGNOSIS — I10 Essential (primary) hypertension: Secondary | ICD-10-CM | POA: Diagnosis not present

## 2020-02-04 DIAGNOSIS — N1831 Chronic kidney disease, stage 3a: Secondary | ICD-10-CM | POA: Diagnosis not present

## 2020-02-22 DIAGNOSIS — M9904 Segmental and somatic dysfunction of sacral region: Secondary | ICD-10-CM | POA: Diagnosis not present

## 2020-02-22 DIAGNOSIS — M461 Sacroiliitis, not elsewhere classified: Secondary | ICD-10-CM | POA: Diagnosis not present

## 2020-02-22 DIAGNOSIS — M5451 Vertebrogenic low back pain: Secondary | ICD-10-CM | POA: Diagnosis not present

## 2020-02-22 DIAGNOSIS — M9903 Segmental and somatic dysfunction of lumbar region: Secondary | ICD-10-CM | POA: Diagnosis not present

## 2020-02-23 DIAGNOSIS — M9904 Segmental and somatic dysfunction of sacral region: Secondary | ICD-10-CM | POA: Diagnosis not present

## 2020-02-23 DIAGNOSIS — I129 Hypertensive chronic kidney disease with stage 1 through stage 4 chronic kidney disease, or unspecified chronic kidney disease: Secondary | ICD-10-CM | POA: Diagnosis not present

## 2020-02-23 DIAGNOSIS — M461 Sacroiliitis, not elsewhere classified: Secondary | ICD-10-CM | POA: Diagnosis not present

## 2020-02-23 DIAGNOSIS — E1122 Type 2 diabetes mellitus with diabetic chronic kidney disease: Secondary | ICD-10-CM | POA: Diagnosis not present

## 2020-02-23 DIAGNOSIS — M5451 Vertebrogenic low back pain: Secondary | ICD-10-CM | POA: Diagnosis not present

## 2020-02-23 DIAGNOSIS — M9903 Segmental and somatic dysfunction of lumbar region: Secondary | ICD-10-CM | POA: Diagnosis not present

## 2020-02-23 DIAGNOSIS — N1832 Chronic kidney disease, stage 3b: Secondary | ICD-10-CM | POA: Diagnosis not present

## 2020-02-23 DIAGNOSIS — R809 Proteinuria, unspecified: Secondary | ICD-10-CM | POA: Diagnosis not present

## 2020-02-23 DIAGNOSIS — N2581 Secondary hyperparathyroidism of renal origin: Secondary | ICD-10-CM | POA: Diagnosis not present

## 2020-02-25 DIAGNOSIS — M5451 Vertebrogenic low back pain: Secondary | ICD-10-CM | POA: Diagnosis not present

## 2020-02-25 DIAGNOSIS — M9904 Segmental and somatic dysfunction of sacral region: Secondary | ICD-10-CM | POA: Diagnosis not present

## 2020-02-25 DIAGNOSIS — M9903 Segmental and somatic dysfunction of lumbar region: Secondary | ICD-10-CM | POA: Diagnosis not present

## 2020-02-25 DIAGNOSIS — M461 Sacroiliitis, not elsewhere classified: Secondary | ICD-10-CM | POA: Diagnosis not present

## 2020-03-01 DIAGNOSIS — M461 Sacroiliitis, not elsewhere classified: Secondary | ICD-10-CM | POA: Diagnosis not present

## 2020-03-01 DIAGNOSIS — M9904 Segmental and somatic dysfunction of sacral region: Secondary | ICD-10-CM | POA: Diagnosis not present

## 2020-03-01 DIAGNOSIS — M9903 Segmental and somatic dysfunction of lumbar region: Secondary | ICD-10-CM | POA: Diagnosis not present

## 2020-03-01 DIAGNOSIS — M5451 Vertebrogenic low back pain: Secondary | ICD-10-CM | POA: Diagnosis not present

## 2020-03-02 ENCOUNTER — Encounter: Payer: Self-pay | Admitting: Emergency Medicine

## 2020-03-02 ENCOUNTER — Ambulatory Visit
Admission: EM | Admit: 2020-03-02 | Discharge: 2020-03-02 | Disposition: A | Discharge status: Home / Self Care | Payer: PPO | Attending: Emergency Medicine | Admitting: Emergency Medicine

## 2020-03-02 ENCOUNTER — Other Ambulatory Visit: Payer: Self-pay

## 2020-03-02 DIAGNOSIS — M25511 Pain in right shoulder: Secondary | ICD-10-CM

## 2020-03-02 MED ORDER — PREDNISONE 10 MG (21) PO TBPK: ORAL_TABLET | Freq: Every day | ORAL | 0 refills | Status: DC

## 2020-03-02 MED ORDER — METHOCARBAMOL 500 MG PO TABS: 500.0000 mg | ORAL_TABLET | Freq: Two times a day (BID) | ORAL | 0 refills | Status: AC

## 2020-03-02 NOTE — ED Triage Notes (Signed)
Pt brought in by daughter. He complains of right shoulder pain x 2 days after shovelling snow. Pain 8/10. Denies injury.

## 2020-03-02 NOTE — Discharge Instructions (Addendum)
Take the prednisone pack according to the package insert.  Take the methocarbamol 1000 mg every 6 hours as needed for spasm in your shoulder.  Use moist heat for 20 minutes at a time, 2-3 times a day to help improve blood flow to the muscles of your shoulder to facilitate healing and decrease spasm.  Massage therapy may also be helpful in helping to aid in pain relief and decrease spasm.  I believe your muscles may be irritated secondary to possible nerve impingement in either your neck or high thoracic spine.  Back in 2018 you saw Dr. Era Bumpers at [facility] and you can follow-up with him or you can call to make an appointment to see Dr. Blanche East at [phone].

## 2020-03-02 NOTE — ED Provider Notes (Signed)
[facility]    CSN: [id number] Arrival date & time: 03/02/20  0846      History   Chief Complaint Chief Complaint  Patient presents with  . Shoulder Pain    right    HPI Aaron Burton is a 87 y.o. male.   HPI   Patient is an 87 year old male here for evaluation of right shoulder pain after shoveling ice and snow 2 days ago.  Patient reports that he has a history of a pinched nerve in his neck that previously affected the left side.  Patient was evaluated and treated by a chiropractor at that time which helped alleviate symptoms.  Patient denies ever having any imaging of his neck.  Patient does have a history of a wedge compression fracture of his second lumbar vertebrae from 2018 and he was treated with a TLSO brace at that time.  Patient was evaluated by neurosurgery at that time at [facility].  Patient reports that his current pain encompasses entire right shoulder girdle and radiates up into his neck.  Patient states that he also has numbness and tingling to his right thumb index finger and middle finger that started 1 week ago and predates the shoulder pain.  Past Medical History:  Diagnosis Date  . Coronary artery disease   . Gout   . Hypercholesterolemia   . Hypertension     Patient Active Problem List   Diagnosis Date Noted  . Angioedema 07/09/2018    Past Surgical History:  Procedure Laterality Date  . ACHILLES TENDON REPAIR    . CAROTID ARTERY ANGIOPLASTY    . COLON SURGERY    . LEG SURGERY    . LITHOTRIPSY         Home Medications    Prior to Admission medications   Medication Sig Start Date End Date Taking? Authorizing Provider  methocarbamol (ROBAXIN) 500 MG tablet Take 1 tablet (500 mg total) by mouth 2 (two) times daily. 03/02/20  Yes Margarette Canada, NP  predniSONE (STERAPRED UNI-PAK 21 TAB) 10 MG (21) TBPK tablet Take by mouth daily. Take 6 tabs by mouth daily  for 2 days, then 5 tabs for 2 days, then 4 tabs for 2 days, then  3 tabs for 2 days, 2 tabs for 2 days, then 1 tab by mouth daily for 2 days 03/02/20  Yes Margarette Canada, NP  amLODipine (NORVASC) 5 MG tablet Take 1 tablet by mouth daily. 02/18/17   [provider]  aspirin EC 81 MG tablet Take 1 tablet by mouth daily.    [provider]  Cholecalciferol 125 MCG (5000 UT) TABS Take 5,000 Units by mouth 2 (two) times a week.    [provider]  diphenhydrAMINE (BENADRYL) 25 mg capsule Take 1 capsule (25 mg total) by mouth 3 (three) times daily for 5 days. 07/10/18 07/15/18  Fritzi Mandes, MD  famotidine (PEPCID) 10 MG tablet Take 1 tablet (10 mg total) by mouth 2 (two) times daily. 07/10/18   Fritzi Mandes, MD  HYDROcodone-acetaminophen (NORCO/VICODIN) 5-325 MG tablet Take 1 tablet by mouth every 6 (six) hours as needed for severe pain. 01/24/20   Lavonia Drafts, MD  Multiple Vitamin (MULTI-VITAMINS) TABS Take 1 tablet by mouth daily.    [provider]  senna-docusate (VEGETABLE LAX+STOOL SOFTENER) 8.6-50 MG tablet Take 0.5 tablets by mouth every evening.    [provider]  simvastatin (ZOCOR) 20 MG tablet Take 20 mg by mouth every evening.     [provider]  triamcinolone cream (KENALOG) 0.1 % Apply 1 application topically 2 (two) times daily. 05/19/18   [provider]  colchicine 0.6 MG tablet Take 1 tablet (0.6 mg total) by mouth daily as needed (gout). 07/10/18 12/26/19  Fritzi Mandes, MD    Family History Family History  Problem Relation Age of Onset  . Stroke Mother   . Hypertension Father   . Heart disease Father     Social History Social History   Tobacco Use  . Smoking status: Never Smoker  . Smokeless tobacco: Never Used  Vaping Use  . Vaping Use: Never used  Substance Use Topics  . Alcohol use: No  . Drug use: No     Allergies   Ace inhibitors   Review of Systems Review of Systems  Musculoskeletal: Positive for arthralgias, myalgias and neck pain. Negative for joint swelling.   Skin: Negative for color change and rash.  Neurological: Positive for weakness and numbness.  Hematological: Negative.   Psychiatric/Behavioral: Negative.      Physical Exam Triage Vital Signs ED Triage Vitals  Enc Vitals Group     BP 03/02/20 0945 (!) 141/61     Pulse Rate 03/02/20 0945 80     Resp 03/02/20 0945 18     Temp 03/02/20 0945 98.6 F (37 C)     Temp Source 03/02/20 0945 Oral     SpO2 03/02/20 0945 99 %     Weight 03/02/20 0944 165 lb (74.8 kg)     Height 03/02/20 0944 5\' 7"  (1.702 m)     Head Circumference --      Peak Flow --      Pain Score 03/02/20 0940 8     Pain Loc --      Pain Edu? --      Excl. in [place]? --    No data found.  Updated Vital Signs BP (!) 141/61 (BP Location: Left Arm)   Pulse 80   Temp 98.6 F (37 C) (Oral)   Resp 18   Ht 5\' 7"  (1.702 m)   Wt 165 lb (74.8 kg)   SpO2 99%   BMI 25.84 kg/m   Visual Acuity Right Eye Distance:   Left Eye Distance:   Bilateral Distance:    Right Eye Near:   Left Eye Near:    Bilateral Near:     Physical Exam Vitals and nursing note reviewed.  Constitutional:      General: He is not in acute distress.    Appearance: Normal appearance. He is normal weight. He is not toxic-appearing.  HENT:     Head: Normocephalic and atraumatic.  Cardiovascular:     Rate and Rhythm: Normal rate and regular rhythm.     Pulses: Normal pulses.     Heart sounds: Normal heart sounds. No murmur heard. No gallop.   Pulmonary:     Effort: Pulmonary effort is normal.     Breath sounds: Normal breath sounds. No wheezing, rhonchi or rales.  Musculoskeletal:        General: Tenderness present. No swelling or deformity.     Cervical back: Normal range of motion and neck supple. Tenderness present.  Lymphadenopathy:     Cervical: No cervical adenopathy.  Skin:    Capillary Refill: Capillary refill takes less than 2 seconds.     Findings: No bruising, erythema or rash.  Neurological:     General: No focal deficit  present.     Mental Status: He is alert  and oriented to person, place, and time.  Psychiatric:        Mood and Affect: Mood normal.        Behavior: Behavior normal.        Thought Content: Thought content normal.        Judgment: Judgment normal.      UC Treatments / Results  Labs (all labs ordered are listed, but only abnormal results are displayed) Labs Reviewed - No data to display  EKG   Radiology No results found.  Procedures Procedures (including critical care time)  Medications Ordered in UC Medications - No data to display  Initial Impression / Assessment and Plan / UC Course  I have reviewed the triage vital signs and the nursing notes.  Pertinent labs & imaging results that were available during my care of the patient were reviewed by me and considered in my medical decision making (see chart for details).   Patient is here for evaluation of 2 separate issues involving his neck and right shoulder.  The first issue is numbness and tingling to his right thumb, index finger, middle finger that has been going on for about a week.  Patient does have 4/5 grip strength in the right hand compared to 5/5 in the left.  Patient's flexion and extension of both arms are 5/5.  Radial ulnar pulses are 2+ bilaterally.  The second issue is pain to the entire right shoulder girdle.  Patient has marked tenderness and spasm to the trapezius muscle on the right as well as the insertion of the long head of the right bicep.  Patient has tenderness with extension flexion internal and external rotation as well as abduction of the right shoulder.  Will treat patient with steroid pack and muscle lectures to help with muscle spasm and inflammation of both the muscle as patient has a history of stage IIIb chronic kidney disease.  We will also advise patient to use moist heat and massage therapy to help alleviate the spasm in the muscle.  I have instructed the patient that he needs to follow-up  with neurosurgery regarding the numbness and weakness in his right hand as I believe it is coming from either his C-spine or high thoracic spine.  Patient has been seen Dr. Era Bumpers at [facility] in the past and I will refer patient back to him.  I will also give patient Dr. Ronalee Belts Haglund's number.   Final Clinical Impressions(s) / UC Diagnoses   Final diagnoses:  Acute pain of right shoulder     Discharge Instructions     Take the prednisone pack according to the package insert.  Take the methocarbamol 1000 mg every 6 hours as needed for spasm in your shoulder.  Use moist heat for 20 minutes at a time, 2-3 times a day to help improve blood flow to the muscles of your shoulder to facilitate healing and decrease spasm.  Massage therapy may also be helpful in helping to aid in pain relief and decrease spasm.  I believe your muscles may be irritated secondary to possible nerve impingement in either your neck or high thoracic spine.  Back in 2018 you saw Dr. Era Bumpers at [facility] and you can follow-up with him or you can call to make an appointment to see Dr. Blanche East at [phone].    ED Prescriptions    Medication Sig Dispense Auth. Provider   predniSONE (STERAPRED UNI-PAK 21 TAB) 10 MG (21) TBPK tablet Take by  mouth daily. Take 6 tabs by mouth daily  for 2 days, then 5 tabs for 2 days, then 4 tabs for 2 days, then 3 tabs for 2 days, 2 tabs for 2 days, then 1 tab by mouth daily for 2 days 42 tablet Margarette Canada, NP   methocarbamol (ROBAXIN) 500 MG tablet Take 1 tablet (500 mg total) by mouth 2 (two) times daily. 20 tablet Margarette Canada, NP     PDMP not reviewed this encounter.   Margarette Canada, NP 03/02/20 1051

## 2020-03-09 ENCOUNTER — Other Ambulatory Visit: Payer: Self-pay | Admitting: Sports Medicine

## 2020-03-09 DIAGNOSIS — M4302 Spondylolysis, cervical region: Secondary | ICD-10-CM

## 2020-03-09 DIAGNOSIS — M50122 Cervical disc disorder at C5-C6 level with radiculopathy: Secondary | ICD-10-CM | POA: Diagnosis not present

## 2020-03-09 DIAGNOSIS — M5030 Other cervical disc degeneration, unspecified cervical region: Secondary | ICD-10-CM

## 2020-03-09 DIAGNOSIS — M542 Cervicalgia: Secondary | ICD-10-CM | POA: Diagnosis not present

## 2020-03-09 DIAGNOSIS — M47812 Spondylosis without myelopathy or radiculopathy, cervical region: Secondary | ICD-10-CM | POA: Diagnosis not present

## 2020-03-09 DIAGNOSIS — M62838 Other muscle spasm: Secondary | ICD-10-CM | POA: Diagnosis not present

## 2020-03-18 ENCOUNTER — Ambulatory Visit
Admission: RE | Admit: 2020-03-18 | Discharge: 2020-03-18 | Disposition: A | Discharge status: Home / Self Care | Payer: PPO | Source: Ambulatory Visit | Attending: Sports Medicine | Admitting: Sports Medicine

## 2020-03-18 ENCOUNTER — Other Ambulatory Visit: Payer: Self-pay

## 2020-03-18 DIAGNOSIS — M5030 Other cervical disc degeneration, unspecified cervical region: Secondary | ICD-10-CM | POA: Insufficient documentation

## 2020-03-18 DIAGNOSIS — M542 Cervicalgia: Secondary | ICD-10-CM | POA: Diagnosis not present

## 2020-03-18 DIAGNOSIS — M4302 Spondylolysis, cervical region: Secondary | ICD-10-CM | POA: Insufficient documentation

## 2020-03-18 DIAGNOSIS — M50122 Cervical disc disorder at C5-C6 level with radiculopathy: Secondary | ICD-10-CM | POA: Insufficient documentation

## 2020-03-18 IMAGING — MR MR CERVICAL SPINE W/O CM
5 series · 38 of 48 positions shown · non-contrast
Comparison: CT of the neck [DATE].

CLINICAL DATA: Neck pain and bilateral shoulder pain for 1 month.

EXAM:
MRI CERVICAL SPINE WITHOUT CONTRAST
TECHNIQUE: Multiplanar, multisequence MR imaging of the cervical spine was
performed. No intravenous contrast was administered.

[Series 5: T2 · sagittal · 3.0mm · 0.62mm/px · 6 of 15 slices shown (1 of 2)]
[im 1/15]
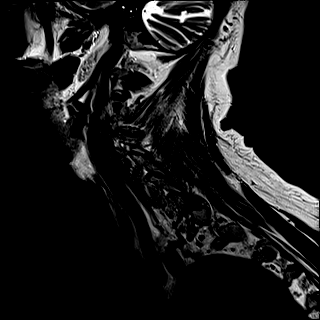
[im 3/15]
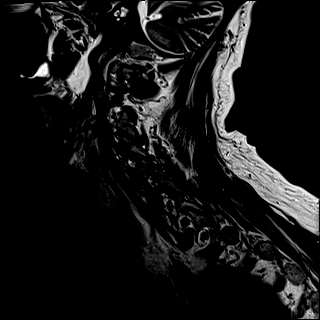
[im 6/15]
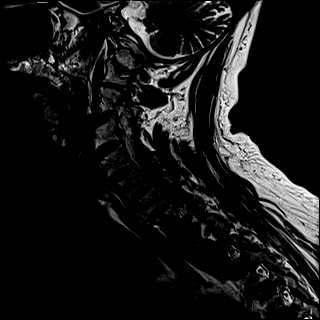
[im 9/15]
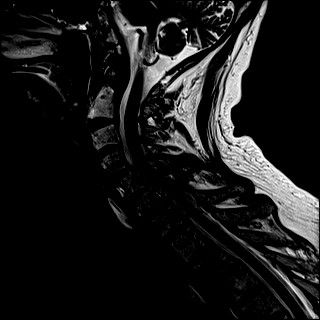
[im 12/15]
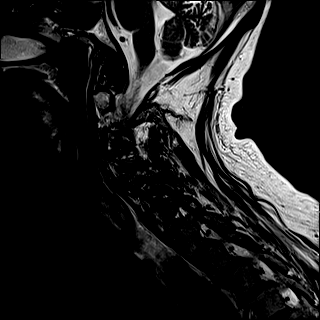
[im 15/15]
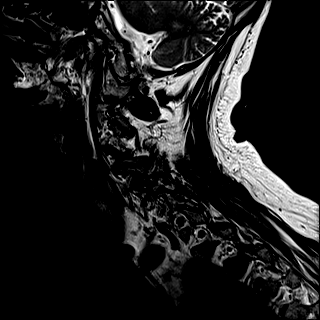

[Series 6: FLAIR · sagittal · 3.0mm · 0.78mm/px · 7 of 15 slices shown]
[im 1/15]
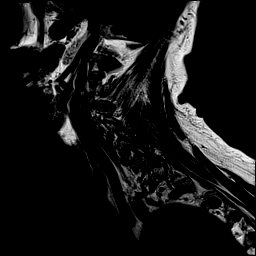
[im 3/15]
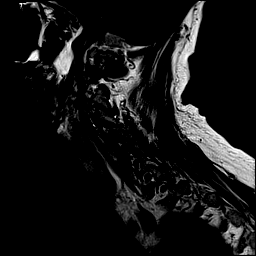
[im 5/15]
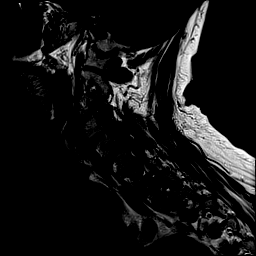
[im 8/15]
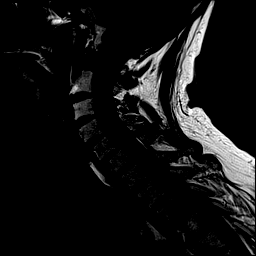
[im 10/15]
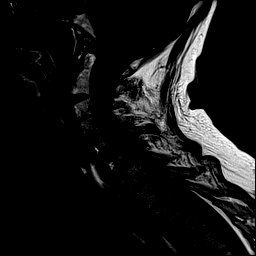
[im 12/15]
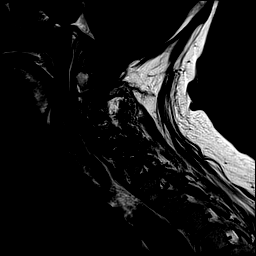
[im 15/15]
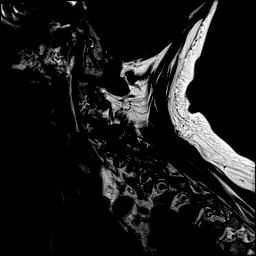

[Series 7: STIR · sagittal · 3.0mm · 0.62mm/px · 7 of 15 slices shown]
[im 1/15]
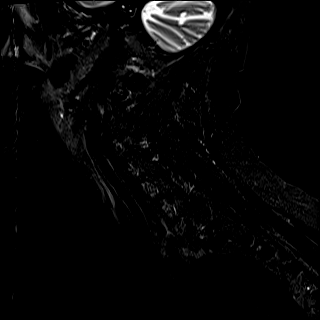
[im 3/15]
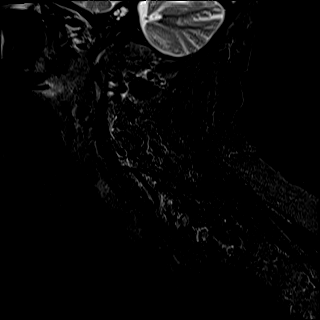
[im 5/15]
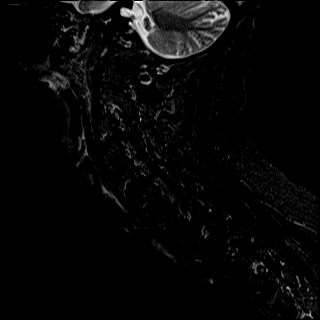
[im 8/15]
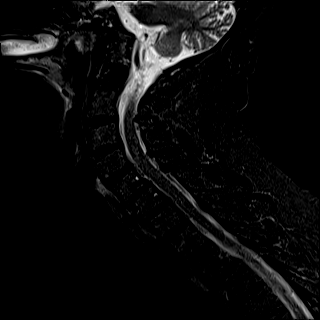
[im 10/15]
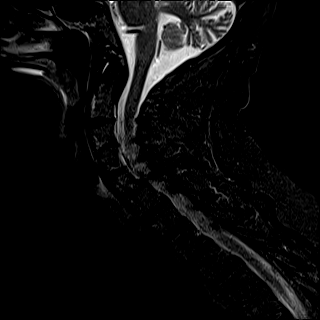
[im 12/15]
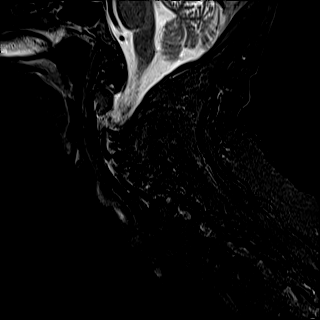
[im 15/15]
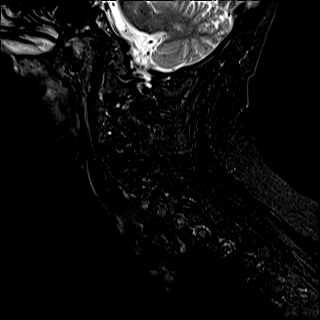

[Series 8: T2 · axial · 3.0mm · 0.70mm/px · z∈[-85,+3]mm · 10 of 29 slices shown (2 of 2)]
[im 1/29]
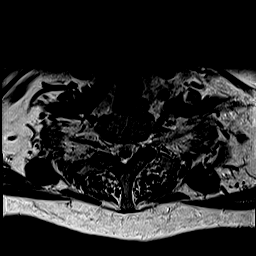
[im 3/29]
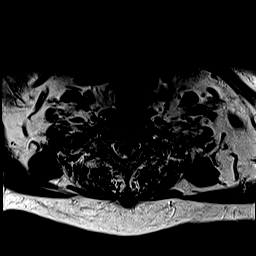
[im 5/29]
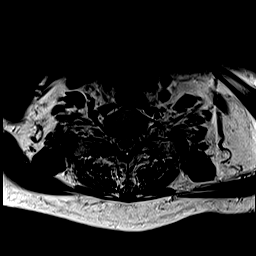
[im 7/29]
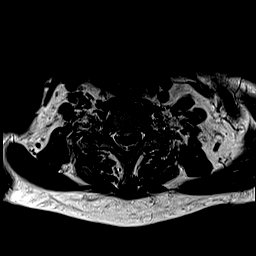
[im 9/29]
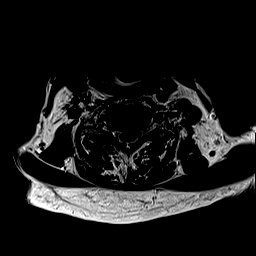
[im 13/29]
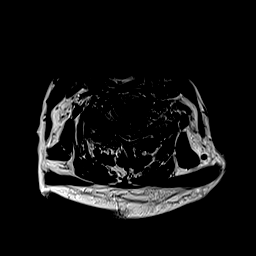
[im 16/29]
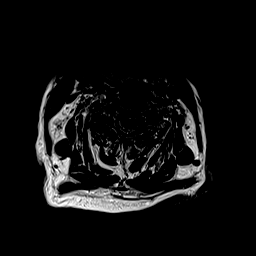
[im 20/29]
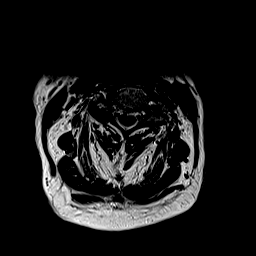
[im 24/29]
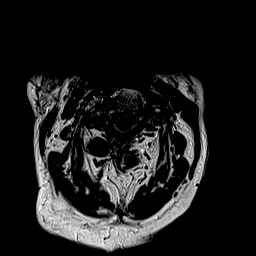
[im 29/29]
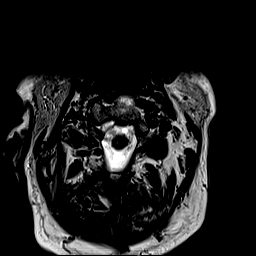

[Series 9: ax mpgr · axial · 3.0mm · 0.35mm/px · z∈[-85,+3]mm · 8 of 29 slices shown]
[im 1/29]
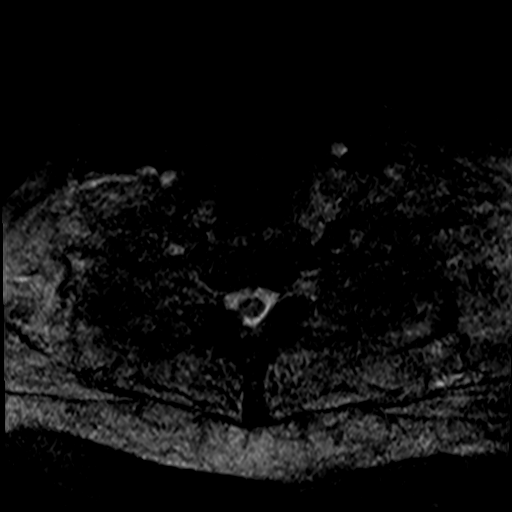
[im 5/29]
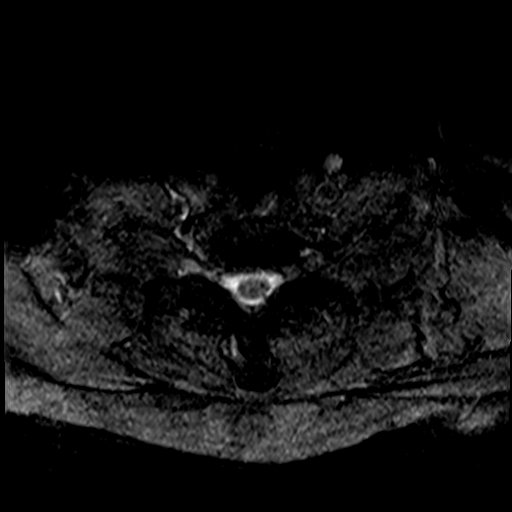
[im 9/29]
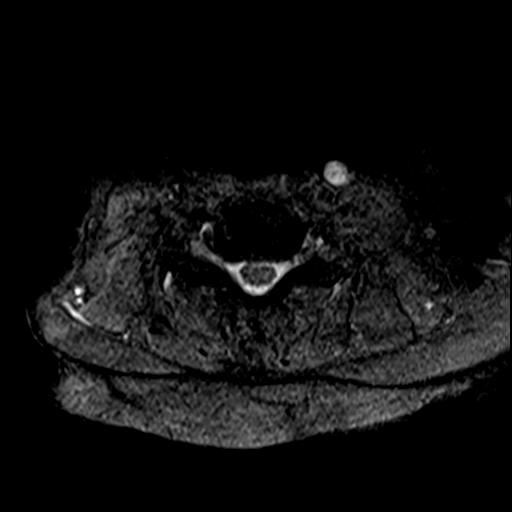
[im 13/29]
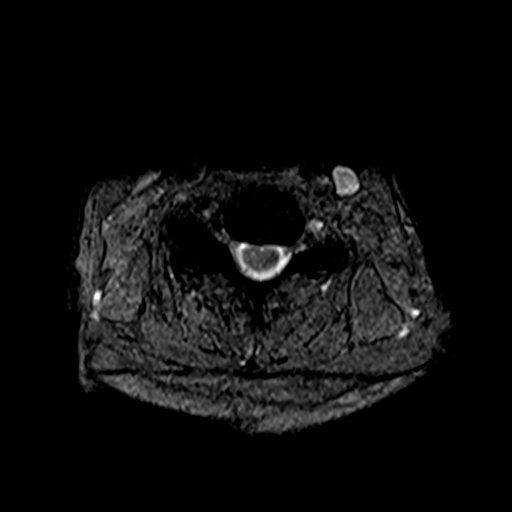
[im 16/29]
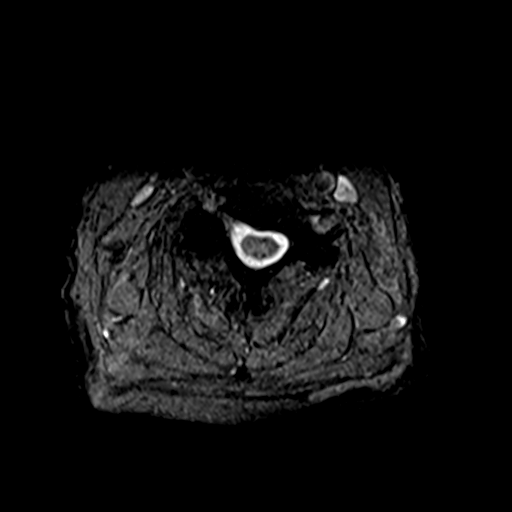
[im 20/29]
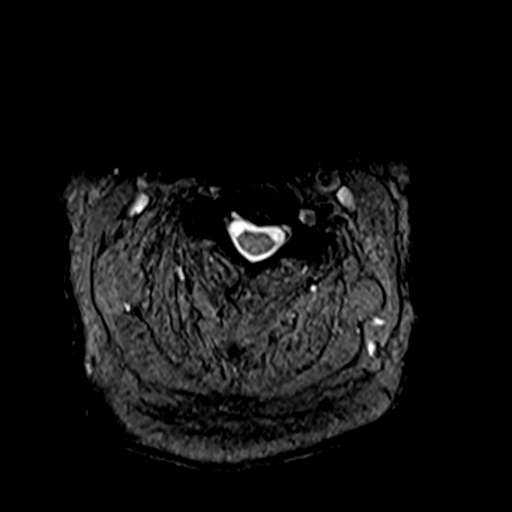
[im 24/29]
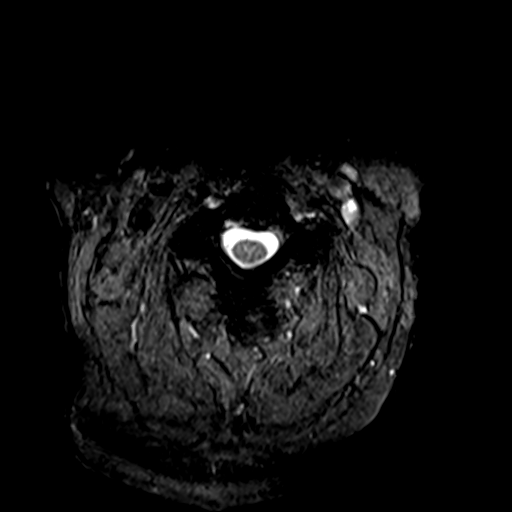
[im 29/29]
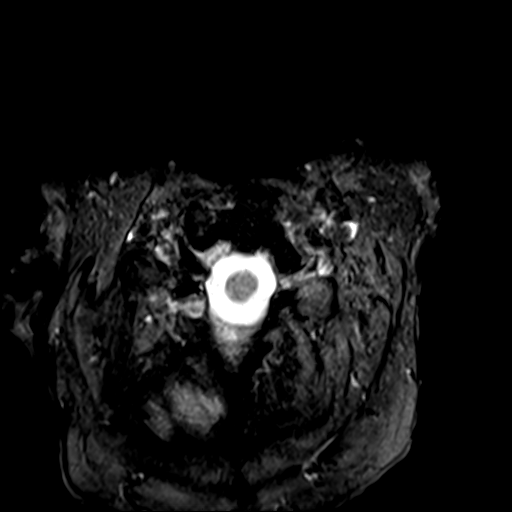

[38 of 48 positions shown; findings below may reference images not displayed]

FINDINGS: Alignment: Approximately 2 mm of anterolisthesis of C4 on C5 and 3
mm of anterolisthesis of C5 on C6.

Vertebrae: Vertebral body heights are maintained. No specific
evidence of acute fracture, discitis/osteomyelitis, or suspicious
bone lesion.

Cord: Normal cord signal.

Posterior Fossa, vertebral arteries, paraspinal tissues: Negative.

Disc levels:

C2-C3: Small posterior disc osteophyte complex and mild facet and
uncovertebral hypertrophy without significant canal or foraminal
stenosis.

C3-C4: Posterior disc osteophyte complex and bilateral facet and
uncovertebral hypertrophy with resulting moderate left greater than
right foraminal stenosis. No significant canal stenosis.

C4-C5: Approximately 2 mm of anterolisthesis of C4 on C5.
Severe/bulky right-sided facet hypertrophy with milder left-sided
facet hypertrophy. Resulting moderate right foraminal stenosis
without significant left foraminal stenosis. Mild canal stenosis.

C5-C6: Approximately 3 mm of anterolisthesis of C5 on C6 with small
posterior disc osteophyte complex. Severe/bulky right-sided facet
hypertrophy with milder left facet hypertrophy. Resulting moderate
right and mild left foraminal stenosis. Mild to moderate canal
stenosis.

C6-C7: Posterior disc osteophyte complex and right greater than left
facet and uncovertebral hypertrophy. Mild canal stenosis and mild
bilateral foraminal stenosis.

C7-T1: Small posterior disc osteophyte complex and mild bilateral
facet hypertrophy. No significant canal or foraminal stenosis.
IMPRESSION: 1. Severe/bulky right-sided facet hypertrophy at C4-C5 and C5-C6
with resulting moderate right foraminal stenosis at these levels.
Additional moderate left greater than right foraminal stenosis at
C3-C4 and multilevel mild foraminal stenosis as detailed above.
2. Mild-to-moderate canal stenosis C5-C6 and mild canal stenosis at
C4-C5 and C6-C7.

## 2020-04-01 DIAGNOSIS — M542 Cervicalgia: Secondary | ICD-10-CM | POA: Diagnosis not present

## 2020-04-01 DIAGNOSIS — M5412 Radiculopathy, cervical region: Secondary | ICD-10-CM | POA: Diagnosis not present

## 2020-04-01 DIAGNOSIS — M47812 Spondylosis without myelopathy or radiculopathy, cervical region: Secondary | ICD-10-CM | POA: Diagnosis not present

## 2020-04-05 DIAGNOSIS — H35371 Puckering of macula, right eye: Secondary | ICD-10-CM | POA: Diagnosis not present

## 2020-04-08 DIAGNOSIS — M47812 Spondylosis without myelopathy or radiculopathy, cervical region: Secondary | ICD-10-CM | POA: Diagnosis not present

## 2020-04-12 DIAGNOSIS — M47812 Spondylosis without myelopathy or radiculopathy, cervical region: Secondary | ICD-10-CM | POA: Diagnosis not present

## 2020-04-15 DIAGNOSIS — H3401 Transient retinal artery occlusion, right eye: Secondary | ICD-10-CM | POA: Diagnosis not present

## 2020-04-15 DIAGNOSIS — H35371 Puckering of macula, right eye: Secondary | ICD-10-CM | POA: Diagnosis not present

## 2020-04-15 DIAGNOSIS — H43813 Vitreous degeneration, bilateral: Secondary | ICD-10-CM | POA: Diagnosis not present

## 2020-04-15 DIAGNOSIS — H353122 Nonexudative age-related macular degeneration, left eye, intermediate dry stage: Secondary | ICD-10-CM | POA: Diagnosis not present

## 2020-04-21 ENCOUNTER — Other Ambulatory Visit: Payer: Self-pay | Admitting: Family Medicine

## 2020-04-21 ENCOUNTER — Other Ambulatory Visit (HOSPITAL_COMMUNITY): Payer: Self-pay | Admitting: Family Medicine

## 2020-04-21 DIAGNOSIS — R7303 Prediabetes: Secondary | ICD-10-CM | POA: Diagnosis not present

## 2020-04-21 DIAGNOSIS — E7800 Pure hypercholesterolemia, unspecified: Secondary | ICD-10-CM | POA: Diagnosis not present

## 2020-04-21 DIAGNOSIS — I10 Essential (primary) hypertension: Secondary | ICD-10-CM | POA: Diagnosis not present

## 2020-04-21 DIAGNOSIS — H349 Unspecified retinal vascular occlusion: Secondary | ICD-10-CM | POA: Diagnosis not present

## 2020-04-28 DIAGNOSIS — X32XXXA Exposure to sunlight, initial encounter: Secondary | ICD-10-CM | POA: Diagnosis not present

## 2020-04-28 DIAGNOSIS — L821 Other seborrheic keratosis: Secondary | ICD-10-CM | POA: Diagnosis not present

## 2020-04-28 DIAGNOSIS — L986 Other infiltrative disorders of the skin and subcutaneous tissue: Secondary | ICD-10-CM | POA: Diagnosis not present

## 2020-04-28 DIAGNOSIS — D044 Carcinoma in situ of skin of scalp and neck: Secondary | ICD-10-CM | POA: Diagnosis not present

## 2020-04-28 DIAGNOSIS — D2271 Melanocytic nevi of right lower limb, including hip: Secondary | ICD-10-CM | POA: Diagnosis not present

## 2020-04-28 DIAGNOSIS — D0439 Carcinoma in situ of skin of other parts of face: Secondary | ICD-10-CM | POA: Diagnosis not present

## 2020-04-28 DIAGNOSIS — D485 Neoplasm of uncertain behavior of skin: Secondary | ICD-10-CM | POA: Diagnosis not present

## 2020-04-28 DIAGNOSIS — D2261 Melanocytic nevi of right upper limb, including shoulder: Secondary | ICD-10-CM | POA: Diagnosis not present

## 2020-04-28 DIAGNOSIS — L570 Actinic keratosis: Secondary | ICD-10-CM | POA: Diagnosis not present

## 2020-04-28 DIAGNOSIS — D2262 Melanocytic nevi of left upper limb, including shoulder: Secondary | ICD-10-CM | POA: Diagnosis not present

## 2020-04-28 DIAGNOSIS — Z85828 Personal history of other malignant neoplasm of skin: Secondary | ICD-10-CM | POA: Diagnosis not present

## 2020-05-01 ENCOUNTER — Other Ambulatory Visit: Payer: Self-pay | Admitting: Surgery

## 2020-05-01 ENCOUNTER — Other Ambulatory Visit: Payer: Self-pay

## 2020-05-01 ENCOUNTER — Ambulatory Visit
Admission: RE | Admit: 2020-05-01 | Discharge: 2020-05-01 | Disposition: A | Discharge status: Home / Self Care | Payer: PPO | Source: Ambulatory Visit | Attending: Family Medicine | Admitting: Family Medicine

## 2020-05-01 DIAGNOSIS — H349 Unspecified retinal vascular occlusion: Secondary | ICD-10-CM

## 2020-05-01 DIAGNOSIS — I6389 Other cerebral infarction: Secondary | ICD-10-CM | POA: Diagnosis not present

## 2020-05-01 DIAGNOSIS — G9389 Other specified disorders of brain: Secondary | ICD-10-CM | POA: Diagnosis not present

## 2020-05-01 DIAGNOSIS — I6782 Cerebral ischemia: Secondary | ICD-10-CM | POA: Diagnosis not present

## 2020-05-01 DIAGNOSIS — H538 Other visual disturbances: Secondary | ICD-10-CM | POA: Diagnosis not present

## 2020-05-01 IMAGING — MR MR HEAD W/O CM
13 series · 45 of 48 positions shown · non-contrast
Comparison: None.

CLINICAL DATA: Episode of right eye vision changes

EXAM:
MRI HEAD WITHOUT CONTRAST
TECHNIQUE: Multiplanar, multiecho pulse sequences of the brain and surrounding
structures were obtained without intravenous contrast.

[Series 5: ax dwi_tracew · axial · 3.0mm · 0.65mm/px · z∈[-66,+88]mm · 6 of 96 slices shown]
[im 1/96]
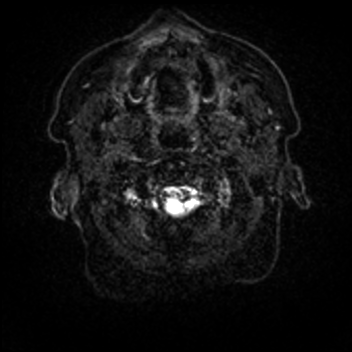
[im 20/96]
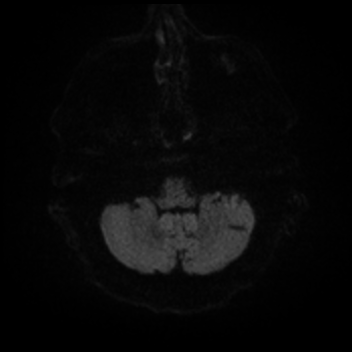
[im 39/96]
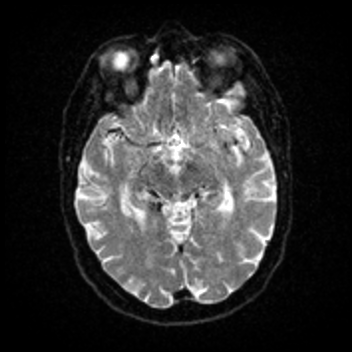
[im 58/96]
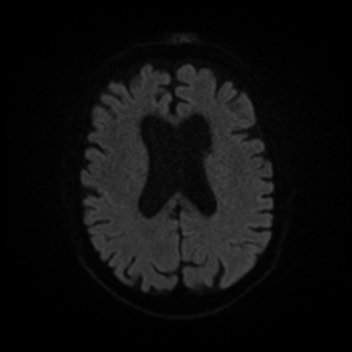
[im 77/96]
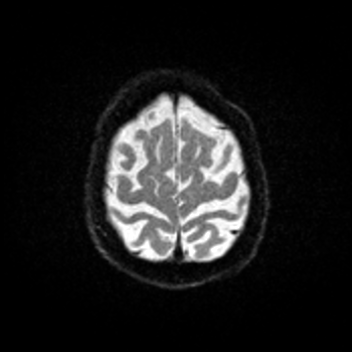
[im 96/96]
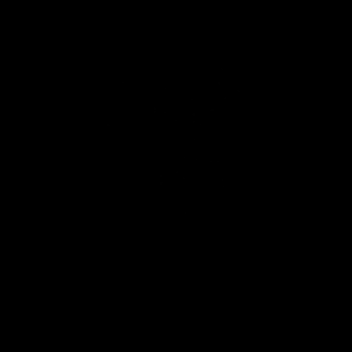

[Series 6: ax dwi_adc · axial · 3.0mm · 0.65mm/px · z∈[-66,+82]mm · 3 of 46 slices shown]
[im 1/46]
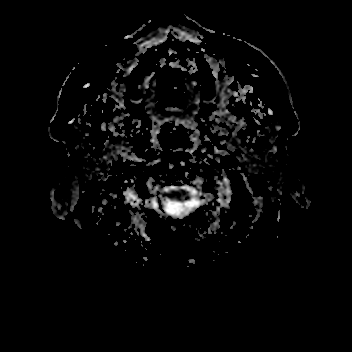
[im 23/46]
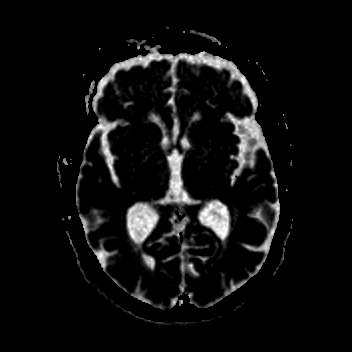
[im 46/46]
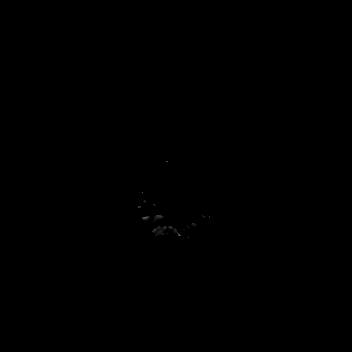

[Series 7: cor dwi_tracew · coronal · 5.0mm · 0.60mm/px · 5 of 76 slices shown]
[im 1/76]
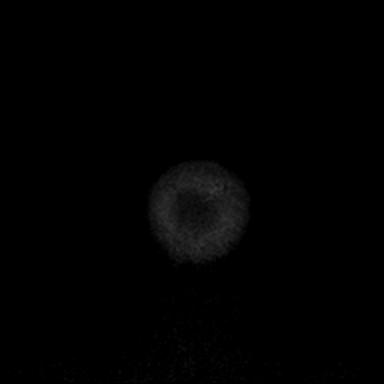
[im 19/76]
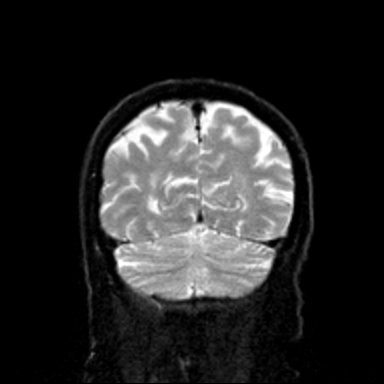
[im 38/76]
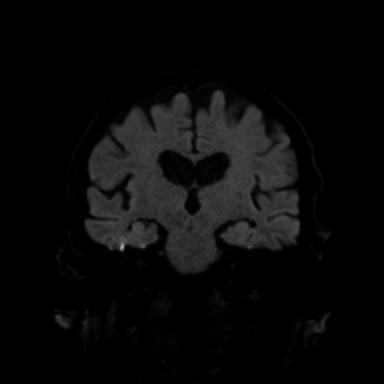
[im 57/76]
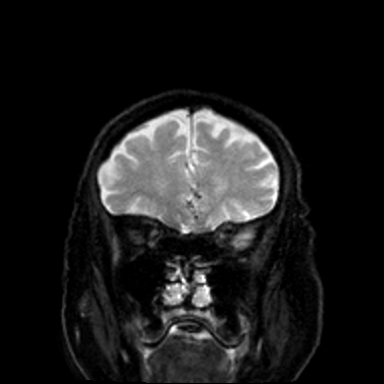
[im 76/76]
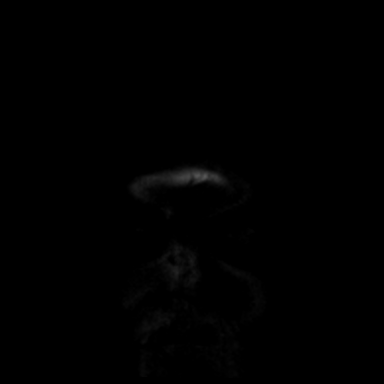

[Series 8: cor dwi_adc · coronal · 5.0mm · 0.60mm/px · 2 of 38 slices shown]
[im 1/38]
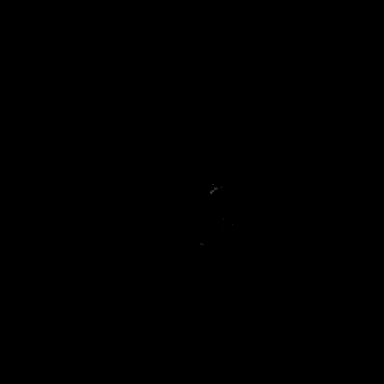
[im 38/38]
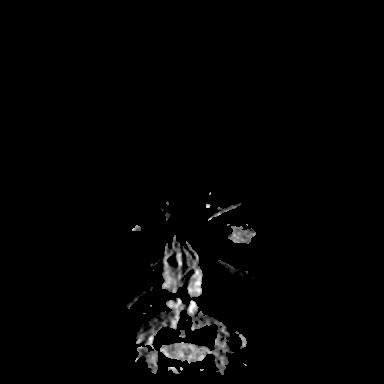

[Series 9: T1 · sagittal · 5.0mm · 0.62mm/px · 2 of 24 slices shown (1 of 2)]
[im 1/24]
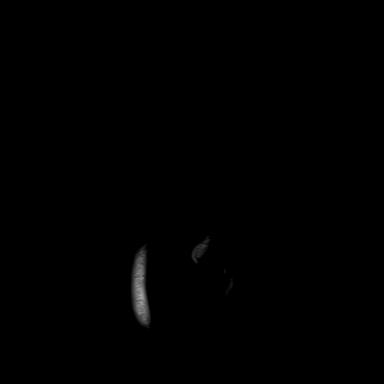
[im 24/24]
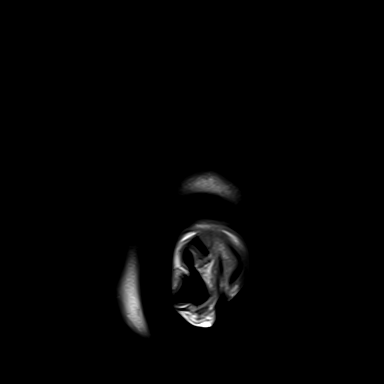

[Series 10: T2 · axial · 5.0mm · 0.53mm/px · z∈[-61,+82]mm · 2 of 25 slices shown (1 of 2)]
[im 1/25]
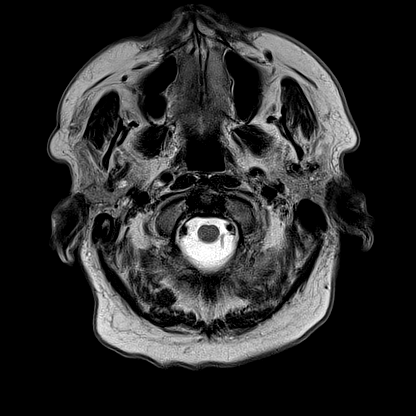
[im 25/25]
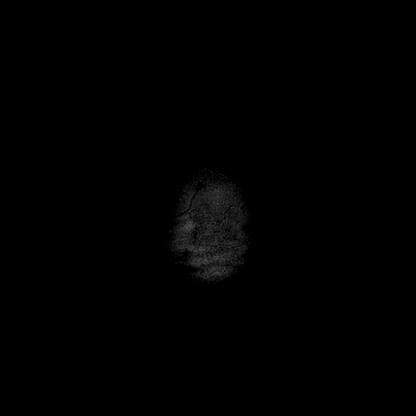

[Series 11: mag_images · axial · 3.0mm · 0.90mm/px · z∈[-65,+87]mm · 3 of 52 slices shown]
[im 1/52]
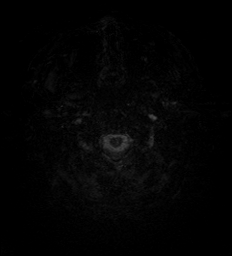
[im 26/52]
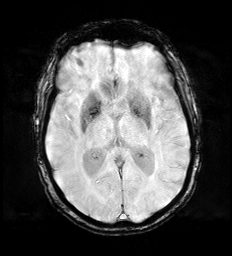
[im 52/52]
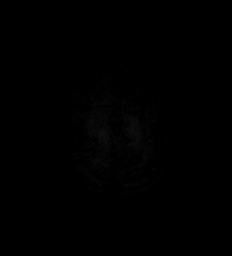

[Series 12: pha_images · axial · 3.0mm · 0.90mm/px · z∈[-65,+84]mm · 3 of 50 slices shown]
[im 1/50]
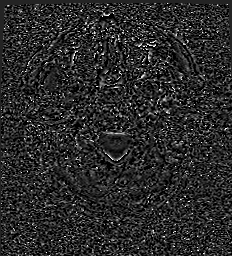
[im 25/50]
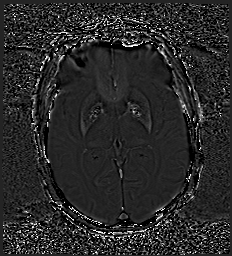
[im 50/50]
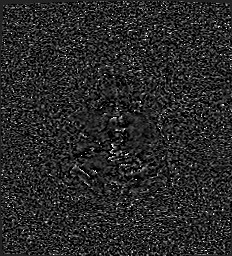

[Series 13: swi_images · axial · 3.0mm · 0.90mm/px · z∈[-65,+87]mm · 3 of 52 slices shown]
[im 1/52]
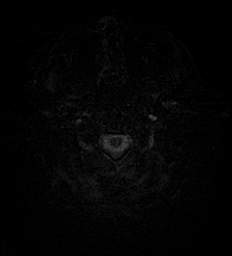
[im 26/52]
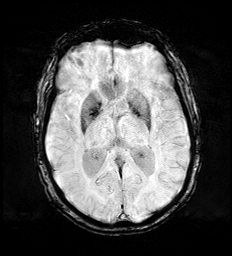
[im 52/52]
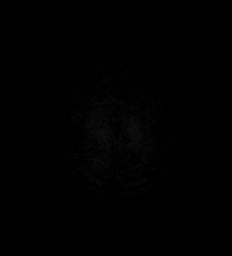

[Series 14: mip_images(sw) · axial · 24.0mm · 0.90mm/px · z∈[-54,+77]mm · 3 of 45 slices shown]
[im 1/45]
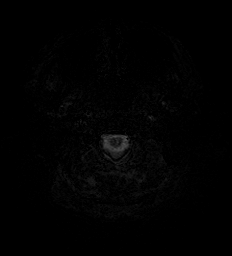
[im 23/45]
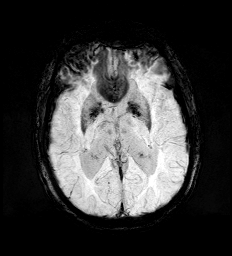
[im 45/45]
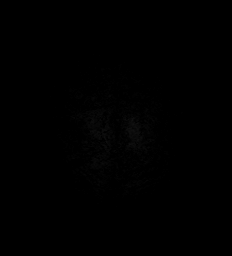

[Series 15: FLAIR · axial · 3.0mm · 0.53mm/px · z∈[-70,+91]mm · 3 of 55 slices shown]
[im 1/55]
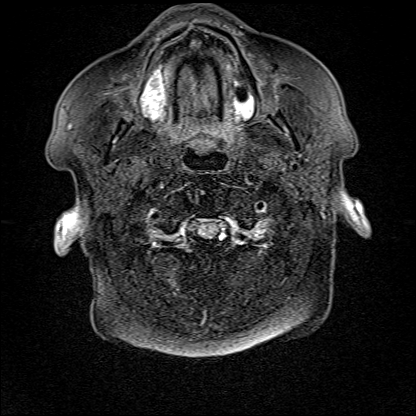
[im 28/55]
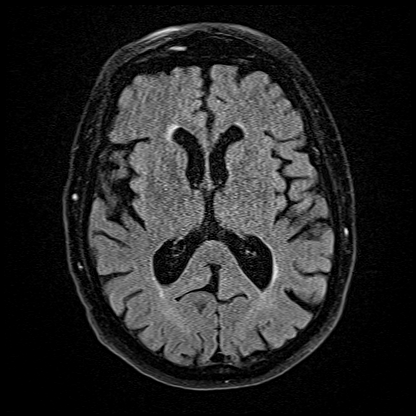
[im 55/55]
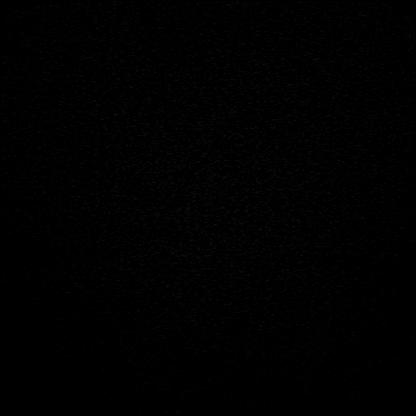

[Series 16: T1 · axial · 1.0mm · 0.98mm/px · z∈[-76,+97]mm · 8 of 175 slices shown (2 of 2)]
[im 1/175]
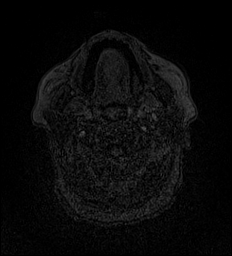
[im 35/175]
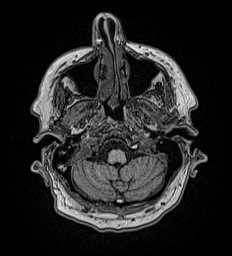
[im 53/175]
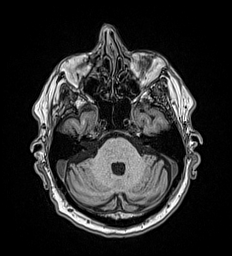
[im 70/175]
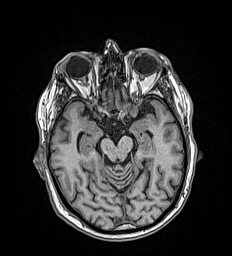
[im 105/175]
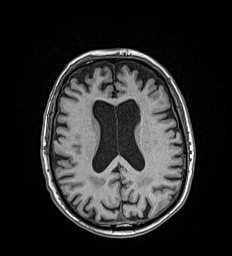
[im 122/175]
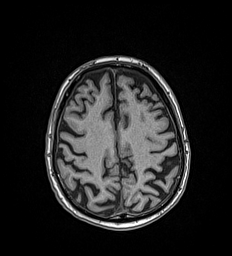
[im 140/175]
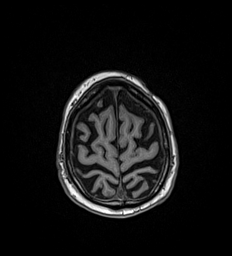
[im 175/175]
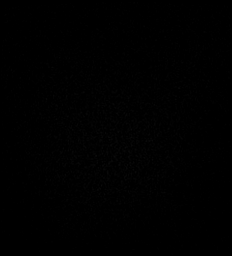

[Series 17: T2 · coronal · 5.0mm · 0.57mm/px · 2 of 29 slices shown (2 of 2)]
[im 1/29]
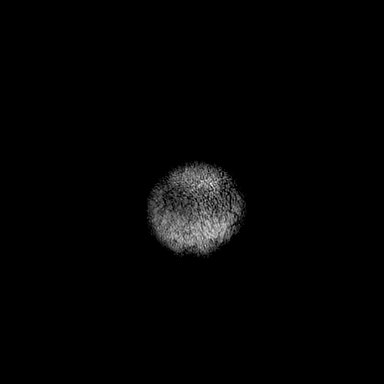
[im 29/29]
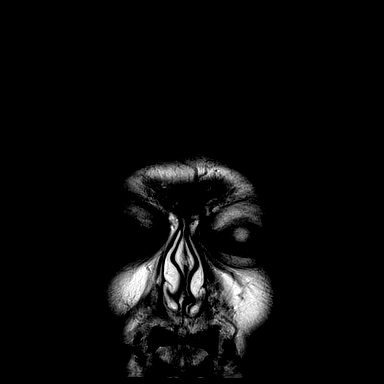

[45 of 48 positions shown; findings below may reference images not displayed]

FINDINGS: Brain: There is no acute infarction or intracranial hemorrhage.
There is no intracranial mass, mass effect, or edema. There is no
hydrocephalus or extra-axial fluid collection. Prominence of the
ventricles and sulci reflects generalized parenchymal volume loss.
Patchy T2 hyperintensity in the supratentorial and pontine white
matter is nonspecific but probably reflects mild chronic
microvascular ischemic changes. Small chronic left cerebellar
infarct.

Vascular: Major vessel flow voids at the skull base are preserved.

Skull and upper cervical spine: Normal marrow signal is preserved.

Sinuses/Orbits: Minor mucosal thickening. Bilateral lens
replacements.

Other: Sella is unremarkable. Mild right mastoid tip fluid
opacification.
IMPRESSION: No evidence of recent infarction, hemorrhage, or mass.

Mild chronic microvascular ischemic changes. Small chronic left
cerebellar infarct.

## 2020-05-02 DIAGNOSIS — H353122 Nonexudative age-related macular degeneration, left eye, intermediate dry stage: Secondary | ICD-10-CM | POA: Diagnosis not present

## 2020-05-02 DIAGNOSIS — H43813 Vitreous degeneration, bilateral: Secondary | ICD-10-CM | POA: Diagnosis not present

## 2020-05-02 DIAGNOSIS — H3401 Transient retinal artery occlusion, right eye: Secondary | ICD-10-CM | POA: Diagnosis not present

## 2020-05-02 DIAGNOSIS — H35371 Puckering of macula, right eye: Secondary | ICD-10-CM | POA: Diagnosis not present

## 2020-05-04 DIAGNOSIS — I6523 Occlusion and stenosis of bilateral carotid arteries: Secondary | ICD-10-CM | POA: Diagnosis not present

## 2020-05-04 DIAGNOSIS — H539 Unspecified visual disturbance: Secondary | ICD-10-CM | POA: Diagnosis not present

## 2020-05-04 DIAGNOSIS — I10 Essential (primary) hypertension: Secondary | ICD-10-CM | POA: Diagnosis not present

## 2020-05-04 DIAGNOSIS — H349 Unspecified retinal vascular occlusion: Secondary | ICD-10-CM | POA: Diagnosis not present

## 2020-05-12 DIAGNOSIS — E119 Type 2 diabetes mellitus without complications: Secondary | ICD-10-CM | POA: Diagnosis not present

## 2020-05-12 DIAGNOSIS — M10072 Idiopathic gout, left ankle and foot: Secondary | ICD-10-CM | POA: Diagnosis not present

## 2020-05-12 DIAGNOSIS — M25572 Pain in left ankle and joints of left foot: Secondary | ICD-10-CM | POA: Diagnosis not present

## 2020-05-20 DIAGNOSIS — M5412 Radiculopathy, cervical region: Secondary | ICD-10-CM | POA: Diagnosis not present

## 2020-05-20 DIAGNOSIS — M159 Polyosteoarthritis, unspecified: Secondary | ICD-10-CM | POA: Diagnosis not present

## 2020-05-26 DIAGNOSIS — E119 Type 2 diabetes mellitus without complications: Secondary | ICD-10-CM | POA: Diagnosis not present

## 2020-05-26 DIAGNOSIS — M10072 Idiopathic gout, left ankle and foot: Secondary | ICD-10-CM | POA: Diagnosis not present

## 2020-05-30 DIAGNOSIS — H43813 Vitreous degeneration, bilateral: Secondary | ICD-10-CM | POA: Diagnosis not present

## 2020-05-30 DIAGNOSIS — H3401 Transient retinal artery occlusion, right eye: Secondary | ICD-10-CM | POA: Diagnosis not present

## 2020-05-30 DIAGNOSIS — H35371 Puckering of macula, right eye: Secondary | ICD-10-CM | POA: Diagnosis not present

## 2020-05-30 DIAGNOSIS — H353122 Nonexudative age-related macular degeneration, left eye, intermediate dry stage: Secondary | ICD-10-CM | POA: Diagnosis not present

## 2020-06-20 DIAGNOSIS — D0439 Carcinoma in situ of skin of other parts of face: Secondary | ICD-10-CM | POA: Diagnosis not present

## 2020-06-20 DIAGNOSIS — C44329 Squamous cell carcinoma of skin of other parts of face: Secondary | ICD-10-CM | POA: Diagnosis not present

## 2020-06-27 DIAGNOSIS — D044 Carcinoma in situ of skin of scalp and neck: Secondary | ICD-10-CM | POA: Diagnosis not present

## 2020-06-28 DIAGNOSIS — H349 Unspecified retinal vascular occlusion: Secondary | ICD-10-CM | POA: Diagnosis not present

## 2020-07-15 ENCOUNTER — Ambulatory Visit: Admission: EM | Admit: 2020-07-15 | Payer: Self-pay

## 2020-07-15 ENCOUNTER — Ambulatory Visit
Admission: EM | Admit: 2020-07-15 | Discharge: 2020-07-15 | Disposition: A | Discharge status: Home / Self Care | Payer: PPO | Source: Home / Self Care

## 2020-07-15 ENCOUNTER — Other Ambulatory Visit: Payer: Self-pay

## 2020-07-15 ENCOUNTER — Ambulatory Visit (INDEPENDENT_AMBULATORY_CARE_PROVIDER_SITE_OTHER): Payer: PPO

## 2020-07-15 DIAGNOSIS — Z20822 Contact with and (suspected) exposure to covid-19: Secondary | ICD-10-CM | POA: Insufficient documentation

## 2020-07-15 DIAGNOSIS — I130 Hypertensive heart and chronic kidney disease with heart failure and stage 1 through stage 4 chronic kidney disease, or unspecified chronic kidney disease: Secondary | ICD-10-CM | POA: Diagnosis not present

## 2020-07-15 DIAGNOSIS — I10 Essential (primary) hypertension: Secondary | ICD-10-CM | POA: Diagnosis not present

## 2020-07-15 DIAGNOSIS — R0689 Other abnormalities of breathing: Secondary | ICD-10-CM | POA: Diagnosis not present

## 2020-07-15 DIAGNOSIS — J9690 Respiratory failure, unspecified, unspecified whether with hypoxia or hypercapnia: Secondary | ICD-10-CM | POA: Diagnosis not present

## 2020-07-15 DIAGNOSIS — H349 Unspecified retinal vascular occlusion: Secondary | ICD-10-CM | POA: Diagnosis not present

## 2020-07-15 DIAGNOSIS — R778 Other specified abnormalities of plasma proteins: Secondary | ICD-10-CM | POA: Diagnosis not present

## 2020-07-15 DIAGNOSIS — R748 Abnormal levels of other serum enzymes: Secondary | ICD-10-CM | POA: Diagnosis not present

## 2020-07-15 DIAGNOSIS — R000 Tachycardia, unspecified: Secondary | ICD-10-CM | POA: Diagnosis not present

## 2020-07-15 DIAGNOSIS — B9789 Other viral agents as the cause of diseases classified elsewhere: Secondary | ICD-10-CM | POA: Diagnosis not present

## 2020-07-15 DIAGNOSIS — I248 Other forms of acute ischemic heart disease: Secondary | ICD-10-CM | POA: Diagnosis not present

## 2020-07-15 DIAGNOSIS — I2510 Atherosclerotic heart disease of native coronary artery without angina pectoris: Secondary | ICD-10-CM | POA: Diagnosis not present

## 2020-07-15 DIAGNOSIS — J9601 Acute respiratory failure with hypoxia: Secondary | ICD-10-CM | POA: Diagnosis not present

## 2020-07-15 DIAGNOSIS — R053 Chronic cough: Secondary | ICD-10-CM | POA: Insufficient documentation

## 2020-07-15 DIAGNOSIS — R059 Cough, unspecified: Secondary | ICD-10-CM | POA: Diagnosis not present

## 2020-07-15 DIAGNOSIS — D696 Thrombocytopenia, unspecified: Secondary | ICD-10-CM | POA: Diagnosis not present

## 2020-07-15 DIAGNOSIS — I700 Atherosclerosis of aorta: Secondary | ICD-10-CM | POA: Diagnosis not present

## 2020-07-15 DIAGNOSIS — R5383 Other fatigue: Secondary | ICD-10-CM | POA: Insufficient documentation

## 2020-07-15 DIAGNOSIS — D72828 Other elevated white blood cell count: Secondary | ICD-10-CM | POA: Diagnosis not present

## 2020-07-15 DIAGNOSIS — N1832 Chronic kidney disease, stage 3b: Secondary | ICD-10-CM | POA: Diagnosis not present

## 2020-07-15 DIAGNOSIS — I480 Paroxysmal atrial fibrillation: Secondary | ICD-10-CM | POA: Diagnosis not present

## 2020-07-15 DIAGNOSIS — A419 Sepsis, unspecified organism: Secondary | ICD-10-CM | POA: Diagnosis not present

## 2020-07-15 DIAGNOSIS — R531 Weakness: Secondary | ICD-10-CM | POA: Diagnosis not present

## 2020-07-15 DIAGNOSIS — J9811 Atelectasis: Secondary | ICD-10-CM | POA: Diagnosis not present

## 2020-07-15 DIAGNOSIS — N179 Acute kidney failure, unspecified: Secondary | ICD-10-CM | POA: Diagnosis not present

## 2020-07-15 DIAGNOSIS — Z7189 Other specified counseling: Secondary | ICD-10-CM | POA: Diagnosis not present

## 2020-07-15 DIAGNOSIS — Z887 Allergy status to serum and vaccine status: Secondary | ICD-10-CM | POA: Insufficient documentation

## 2020-07-15 DIAGNOSIS — Z66 Do not resuscitate: Secondary | ICD-10-CM | POA: Diagnosis not present

## 2020-07-15 DIAGNOSIS — I5033 Acute on chronic diastolic (congestive) heart failure: Secondary | ICD-10-CM | POA: Diagnosis not present

## 2020-07-15 DIAGNOSIS — J122 Parainfluenza virus pneumonia: Secondary | ICD-10-CM | POA: Diagnosis not present

## 2020-07-15 DIAGNOSIS — Z7902 Long term (current) use of antithrombotics/antiplatelets: Secondary | ICD-10-CM | POA: Insufficient documentation

## 2020-07-15 DIAGNOSIS — J189 Pneumonia, unspecified organism: Secondary | ICD-10-CM

## 2020-07-15 DIAGNOSIS — I517 Cardiomegaly: Secondary | ICD-10-CM | POA: Diagnosis not present

## 2020-07-15 DIAGNOSIS — U071 COVID-19: Secondary | ICD-10-CM | POA: Diagnosis not present

## 2020-07-15 DIAGNOSIS — Z79899 Other long term (current) drug therapy: Secondary | ICD-10-CM | POA: Insufficient documentation

## 2020-07-15 DIAGNOSIS — R062 Wheezing: Secondary | ICD-10-CM | POA: Diagnosis not present

## 2020-07-15 DIAGNOSIS — J90 Pleural effusion, not elsewhere classified: Secondary | ICD-10-CM | POA: Diagnosis not present

## 2020-07-15 DIAGNOSIS — B348 Other viral infections of unspecified site: Secondary | ICD-10-CM | POA: Diagnosis not present

## 2020-07-15 DIAGNOSIS — E7800 Pure hypercholesterolemia, unspecified: Secondary | ICD-10-CM | POA: Diagnosis not present

## 2020-07-15 DIAGNOSIS — T380X5A Adverse effect of glucocorticoids and synthetic analogues, initial encounter: Secondary | ICD-10-CM | POA: Diagnosis not present

## 2020-07-15 DIAGNOSIS — E785 Hyperlipidemia, unspecified: Secondary | ICD-10-CM | POA: Diagnosis not present

## 2020-07-15 DIAGNOSIS — E875 Hyperkalemia: Secondary | ICD-10-CM | POA: Diagnosis not present

## 2020-07-15 DIAGNOSIS — J811 Chronic pulmonary edema: Secondary | ICD-10-CM | POA: Diagnosis not present

## 2020-07-15 DIAGNOSIS — J80 Acute respiratory distress syndrome: Secondary | ICD-10-CM | POA: Diagnosis not present

## 2020-07-15 DIAGNOSIS — R0602 Shortness of breath: Secondary | ICD-10-CM | POA: Diagnosis not present

## 2020-07-15 DIAGNOSIS — M109 Gout, unspecified: Secondary | ICD-10-CM | POA: Diagnosis not present

## 2020-07-15 DIAGNOSIS — Z515 Encounter for palliative care: Secondary | ICD-10-CM | POA: Diagnosis not present

## 2020-07-15 DIAGNOSIS — A4189 Other specified sepsis: Secondary | ICD-10-CM | POA: Diagnosis not present

## 2020-07-15 DIAGNOSIS — I214 Non-ST elevation (NSTEMI) myocardial infarction: Secondary | ICD-10-CM | POA: Diagnosis not present

## 2020-07-15 DIAGNOSIS — Z7982 Long term (current) use of aspirin: Secondary | ICD-10-CM | POA: Insufficient documentation

## 2020-07-15 DIAGNOSIS — R0902 Hypoxemia: Secondary | ICD-10-CM | POA: Diagnosis not present

## 2020-07-15 DIAGNOSIS — R7989 Other specified abnormal findings of blood chemistry: Secondary | ICD-10-CM | POA: Diagnosis not present

## 2020-07-15 LAB — RESP PANEL BY RT-PCR (FLU A&B, COVID) ARPGX2
Influenza A by PCR: NEGATIVE
Influenza B by PCR: NEGATIVE
SARS Coronavirus 2 by RT PCR: NEGATIVE

## 2020-07-15 IMAGING — CR DG CHEST 2V
2 series · 2 of 2 positions shown · non-contrast
Comparison: [DATE].

CLINICAL DATA: Productive cough.

EXAM:
CHEST - 2 VIEW

[chest pa]
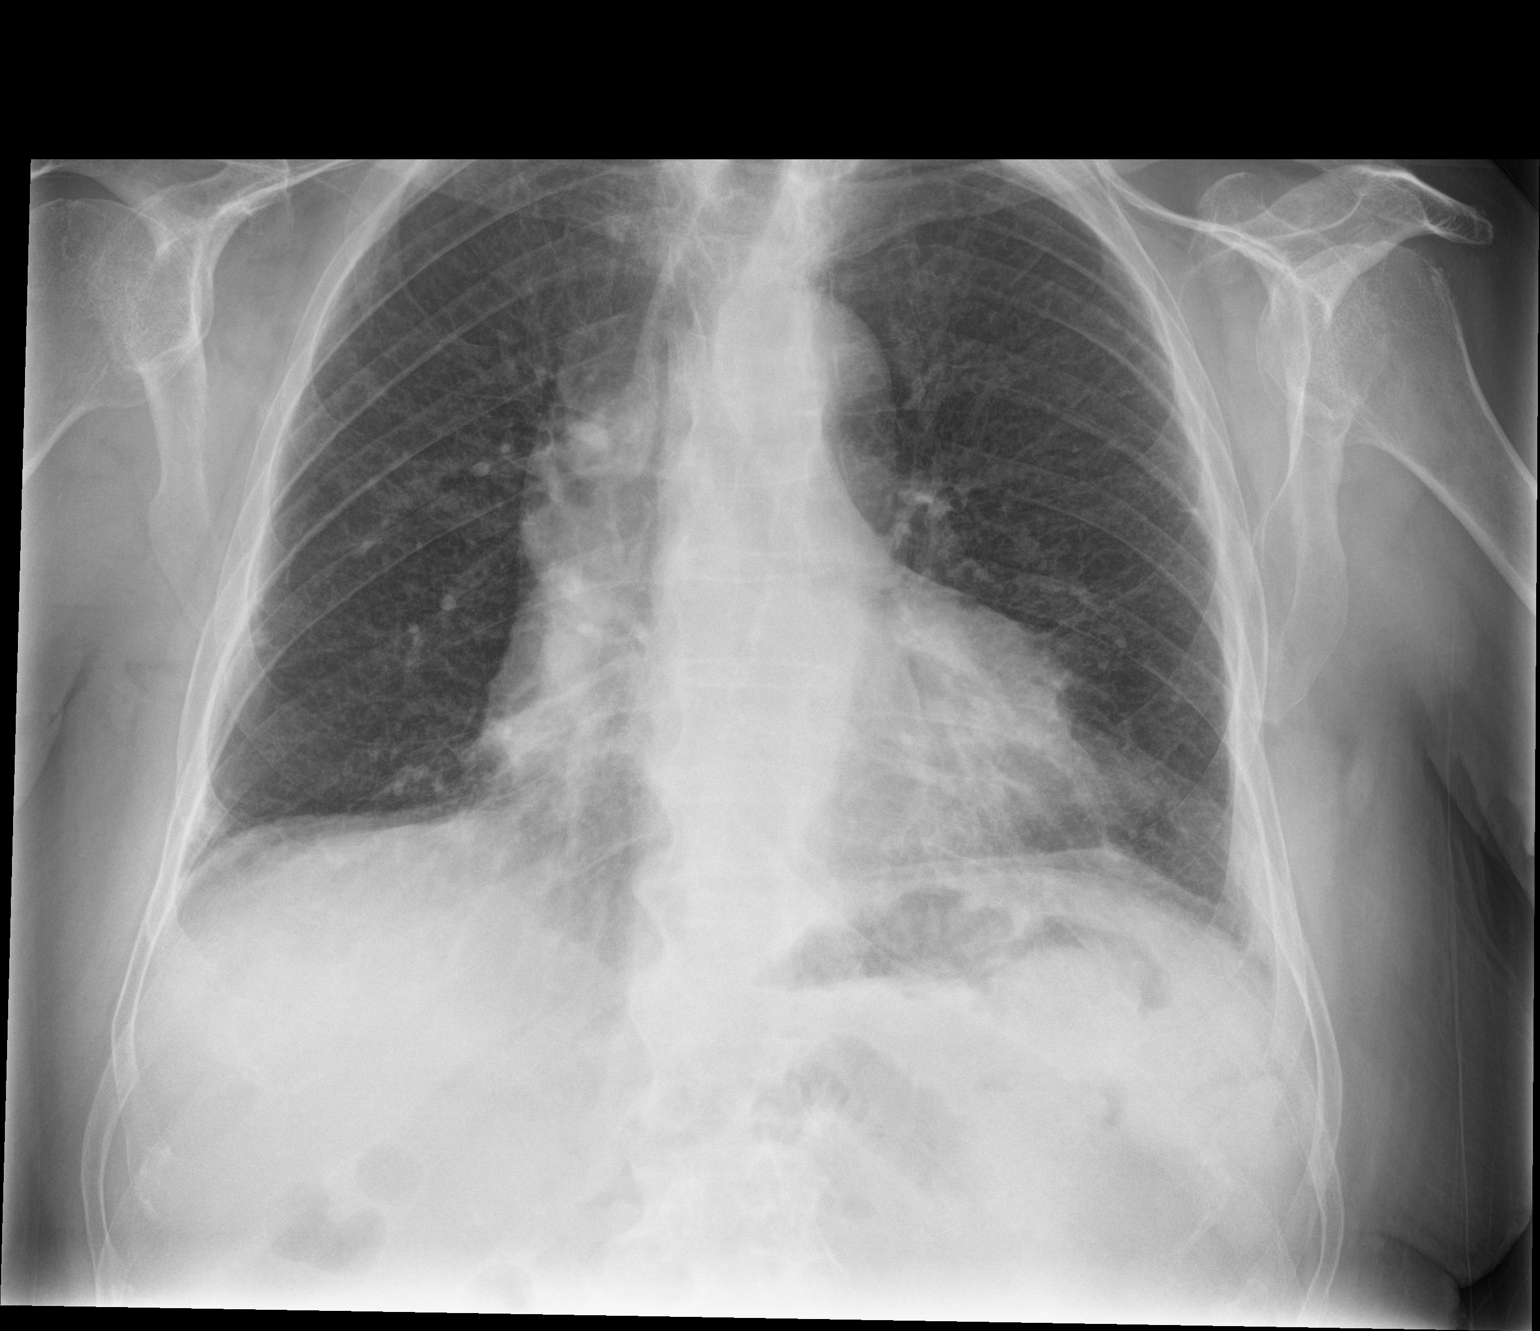

[chest lat]
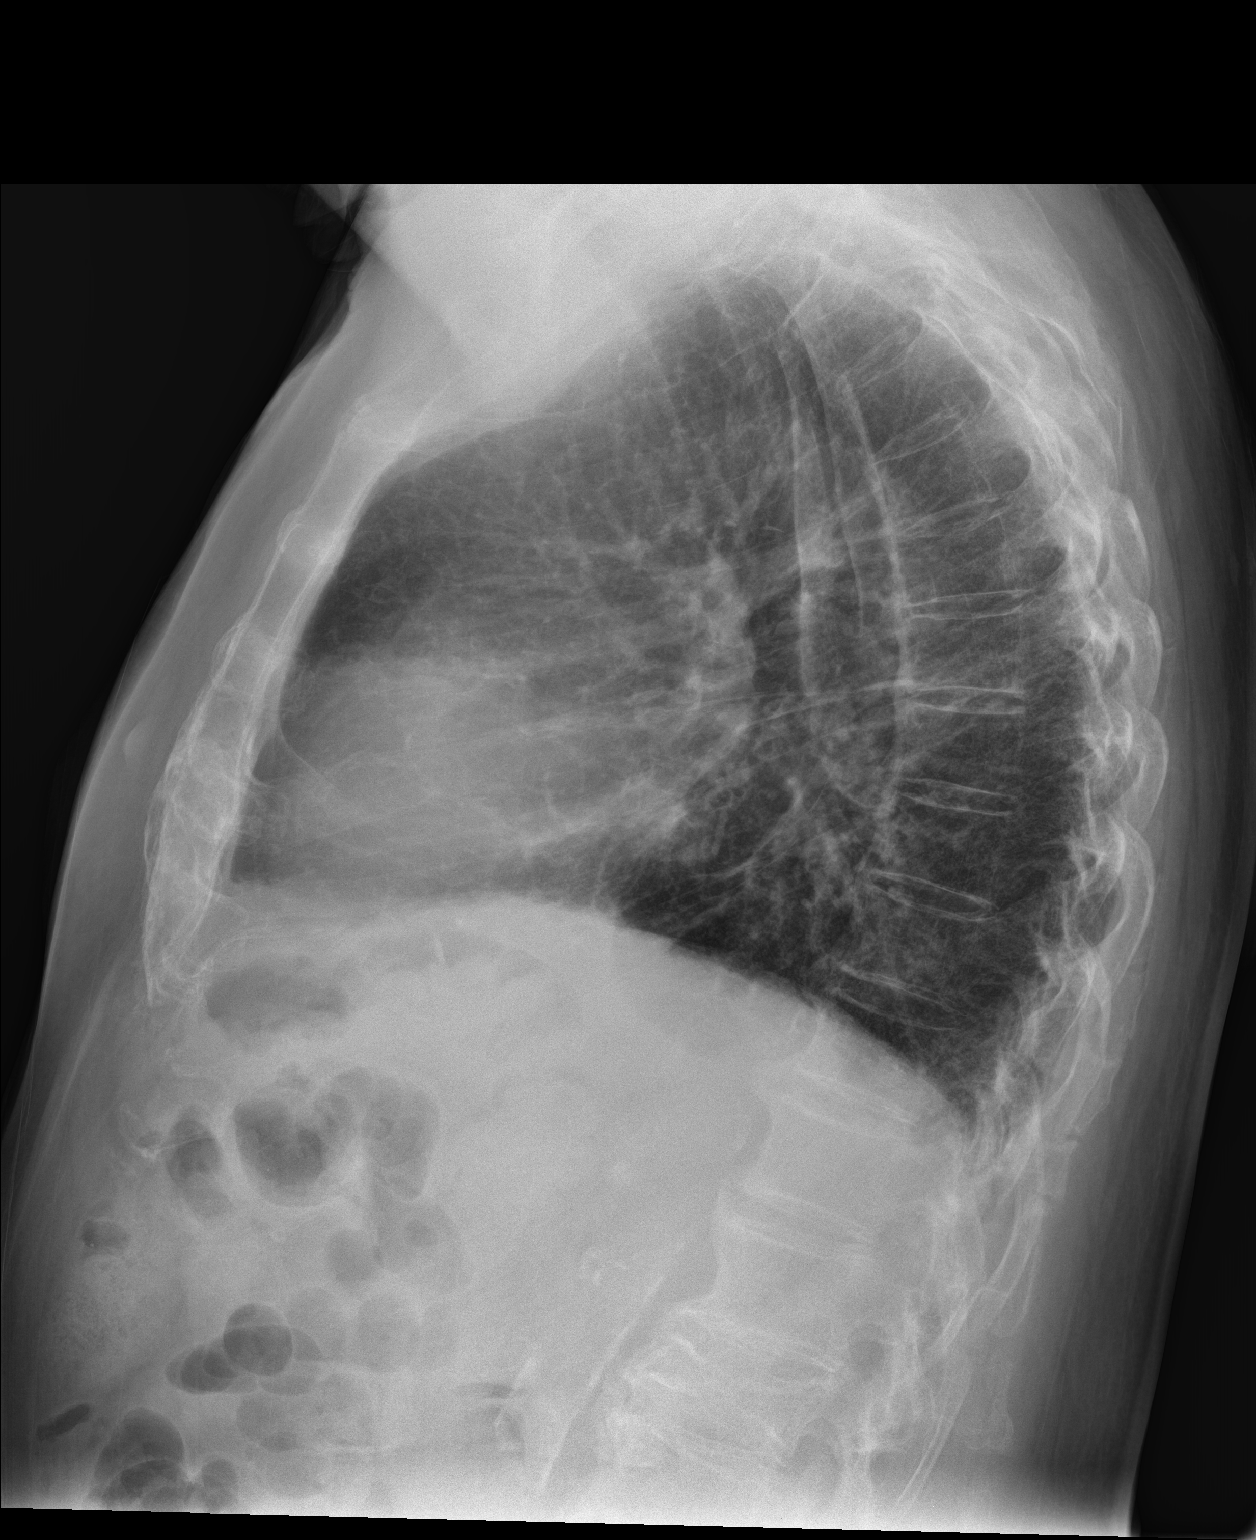

[2 of 2 positions shown; findings below may reference images not displayed]

FINDINGS: Similar streaky/linear left basilar/retrocardiac opacities. No new
consolidation. Previously seen interstitial opacities are improved.
Scattered calcified granulomas. No visible pneumothorax or pleural
effusion. Biapical pleuroparenchymal scarring. Chronic L2
compression fracture. Calcific atherosclerosis. Similar
cardiomediastinal silhouette.
IMPRESSION: Similar streaky/linear left basilar/retrocardiac opacities. The
stability of this finding favors atelectasis/scar over infection.

## 2020-07-15 MED ORDER — BENZONATATE 100 MG PO CAPS: 200.0000 mg | ORAL_CAPSULE | Freq: Three times a day (TID) | ORAL | 0 refills | Status: AC

## 2020-07-15 MED ORDER — AMOXICILLIN-POT CLAVULANATE 875-125 MG PO TABS: 1.0000 | ORAL_TABLET | Freq: Two times a day (BID) | ORAL | 0 refills | Status: AC

## 2020-07-15 MED ORDER — PROMETHAZINE-DM 6.25-15 MG/5ML PO SYRP: 5.0000 mL | ORAL_SOLUTION | Freq: Four times a day (QID) | ORAL | 0 refills | Status: AC | PRN

## 2020-07-15 MED ORDER — AZITHROMYCIN 250 MG PO TABS: 250.0000 mg | ORAL_TABLET | Freq: Every day | ORAL | 0 refills | Status: AC

## 2020-07-15 NOTE — ED Provider Notes (Signed)
[facility]    CSN: [id number] Arrival date & time: 07/15/20  0935      History   Chief Complaint Chief Complaint  Patient presents with  . Cough  . Nasal Congestion    HPI Aaron Burton is a 87 y.o. male.   HPI   87 year old male here for evaluation of cough.  Patient reports that he has had a productive cough and chest congestion for the last 3 months.  He reports that he mowed his grass yesterday and he thinks that that made his symptoms a little bit worse.  He is also experiencing nasal congestion with scant nasal discharge, sore throat, and significant fatigue.  He denies fever, ear pain, shortness of breath or wheezing, or body aches.  Past Medical History:  Diagnosis Date  . Coronary artery disease   . Gout   . Hypercholesterolemia   . Hypertension     Patient Active Problem List   Diagnosis Date Noted  . Angioedema 07/09/2018    Past Surgical History:  Procedure Laterality Date  . ACHILLES TENDON REPAIR    . CAROTID ARTERY ANGIOPLASTY    . COLON SURGERY    . LEG SURGERY    . LITHOTRIPSY         Home Medications    Prior to Admission medications   Medication Sig Start Date End Date Taking? Authorizing Provider  amLODipine (NORVASC) 5 MG tablet Take 1 tablet by mouth daily. 02/18/17  Yes [provider]  amoxicillin-clavulanate (AUGMENTIN) 875-125 MG tablet Take 1 tablet by mouth every 12 (twelve) hours. 07/15/20  Yes Margarette Canada, NP  aspirin EC 81 MG tablet Take 1 tablet by mouth daily.   Yes [provider]  azithromycin (ZITHROMAX Z-PAK) 250 MG tablet Take 1 tablet (250 mg total) by mouth daily. Take 2 tablets on the first day and then 1 tablet daily thereafter for a total of 5 days of treatment. 07/15/20  Yes Margarette Canada, NP  benzonatate (TESSALON) 100 MG capsule Take 2 capsules (200 mg total) by mouth every 8 (eight) hours. 07/15/20  Yes Margarette Canada, NP  Cholecalciferol 125 MCG (5000 UT) TABS Take 5,000 Units by mouth 2  (two) times a week.   Yes [provider]  clopidogrel (PLAVIX) 75 MG tablet Take 1 tablet by mouth daily. 07/12/20  Yes [provider]  gabapentin (NEURONTIN) 300 MG capsule Take 2 capsules by mouth at bedtime. 07/12/20 07/12/21 Yes [provider]  Multiple Vitamin (MULTI-VITAMINS) TABS Take 1 tablet by mouth daily.   Yes [provider]  promethazine-dextromethorphan (PROMETHAZINE-DM) 6.25-15 MG/5ML syrup Take 5 mLs by mouth 4 (four) times daily as needed. 07/15/20  Yes Margarette Canada, NP  senna-docusate (SENOKOT-S) 8.6-50 MG tablet Take 0.5 tablets by mouth every evening.   Yes [provider]  simvastatin (ZOCOR) 20 MG tablet Take 20 mg by mouth every evening.    Yes [provider]  triamcinolone cream (KENALOG) 0.1 % Apply 1 application topically 2 (two) times daily. 05/19/18  Yes [provider]  diphenhydrAMINE (BENADRYL) 25 mg capsule Take 1 capsule (25 mg total) by mouth 3 (three) times daily for 5 days. 07/10/18 07/15/18  Fritzi Mandes, MD  famotidine (PEPCID) 10 MG tablet Take 1 tablet (10 mg total) by mouth 2 (two) times daily. 07/10/18   Fritzi Mandes, MD  HYDROcodone-acetaminophen (NORCO/VICODIN) 5-325 MG tablet Take 1 tablet by mouth every 6 (six) hours as needed for severe pain. 01/24/20   Lavonia Drafts, MD  methocarbamol (ROBAXIN) 500 MG tablet Take 1 tablet (500 mg total) by mouth 2 (two) times daily. 03/02/20   Margarette Canada, NP  colchicine 0.6 MG tablet Take 1 tablet (0.6 mg total) by mouth daily as needed (gout). 07/10/18 12/26/19  Fritzi Mandes, MD    Family History Family History  Problem Relation Age of Onset  . Stroke Mother   . Hypertension Father   . Heart disease Father     Social History Social History   Tobacco Use  . Smoking status: Never Smoker  . Smokeless tobacco: Never Used  Vaping Use  . Vaping Use: Never used  Substance Use Topics  . Alcohol use: No  . Drug use: No     Allergies   Ace inhibitors  and Haemophilus influenzae vaccines   Review of Systems Review of Systems  Constitutional: Positive for fatigue. Negative for activity change, appetite change and fever.  HENT: Positive for congestion, rhinorrhea and sore throat. Negative for ear pain.   Respiratory: Positive for cough. Negative for shortness of breath and wheezing.   Musculoskeletal: Negative for arthralgias and myalgias.  Skin: Negative for rash.     Physical Exam Triage Vital Signs ED Triage Vitals  Enc Vitals Group     BP 07/15/20 0956 (!) 146/69     Pulse Rate 07/15/20 0956 96     Resp 07/15/20 0956 18     Temp 07/15/20 0956 (!) 101 F (38.3 C)     Temp Source 07/15/20 0956 Oral     SpO2 07/15/20 0956 93 %     Weight 07/15/20 0953 160 lb (72.6 kg)     Height 07/15/20 0953 5\' 6"  (1.676 m)     Head Circumference --      Peak Flow --      Pain Score 07/15/20 0953 0     Pain Loc --      Pain Edu? --      Excl. in [place]? --    No data found.  Updated Vital Signs BP (!) 146/69 (BP Location: Left Arm)   Pulse 96   Temp (!) 101 F (38.3 C) (Oral)   Resp 18   Ht 5\' 6"  (1.676 m)   Wt 160 lb (72.6 kg)   SpO2 93%   BMI 25.82 kg/m   Visual Acuity Right Eye Distance:   Left Eye Distance:   Bilateral Distance:    Right Eye Near:   Left Eye Near:    Bilateral Near:     Physical Exam Vitals and nursing note reviewed.  Constitutional:      General: He is not in acute distress.    Appearance: Normal appearance. He is ill-appearing.  HENT:     Head: Normocephalic and atraumatic.     Right Ear: Tympanic membrane, ear canal and external ear normal. There is no impacted cerumen.     Left Ear: Tympanic membrane, ear canal and external ear normal. There is no impacted cerumen.     Nose: Congestion present. No rhinorrhea.     Mouth/Throat:     Mouth: Mucous membranes are moist.     Pharynx: Oropharynx is clear. No posterior oropharyngeal erythema.  Cardiovascular:     Rate and Rhythm: Normal rate and  regular rhythm.     Pulses: Normal pulses.     Heart sounds: Normal heart sounds. No murmur heard. No gallop.   Pulmonary:     Effort: Pulmonary effort is normal.     Breath sounds: Rales present.  No wheezing or rhonchi.  Musculoskeletal:     Cervical back: Normal range of motion and neck supple.  Lymphadenopathy:     Cervical: No cervical adenopathy.  Skin:    General: Skin is warm and dry.     Capillary Refill: Capillary refill takes less than 2 seconds.     Findings: No erythema or rash.  Neurological:     General: No focal deficit present.     Mental Status: He is alert and oriented to person, place, and time.  Psychiatric:        Mood and Affect: Mood normal.        Behavior: Behavior normal.        Thought Content: Thought content normal.        Judgment: Judgment normal.      UC Treatments / Results  Labs (all labs ordered are listed, but only abnormal results are displayed) Labs Reviewed  RESP PANEL BY RT-PCR (FLU A&B, COVID) [id number]    EKG   Radiology DG Chest 2 View  Result Date: 07/15/2020 CLINICAL DATA:  Productive cough. EXAM: CHEST - 2 VIEW COMPARISON:  January 24, 2020. FINDINGS: Similar streaky/linear left basilar/retrocardiac opacities. No new consolidation. Previously seen interstitial opacities are improved. Scattered calcified granulomas. No visible pneumothorax or pleural effusion. Biapical pleuroparenchymal scarring. Chronic L2 compression fracture. Calcific atherosclerosis. Similar cardiomediastinal silhouette. IMPRESSION: Similar streaky/linear left basilar/retrocardiac opacities. The stability of this finding favors atelectasis/scar over infection. Electronically Signed   By: Margaretha Sheffield MD   On: 07/15/2020 11:03    Procedures Procedures (including critical care time)  Medications Ordered in UC Medications - No data to display  Initial Impression / Assessment and Plan / UC Course  I have reviewed the triage vital signs and the nursing  notes.  Pertinent labs & imaging results that were available during my care of the patient were reviewed by me and considered in my medical decision making (see chart for details).   Patient is a very pleasant but ill-appearing 87 year old male here for evaluation of nasal congestion, cough, and fatigue.  Patient reports that he has had chest congestion and a productive cough for the last 3 months.  He has seen his PCP and was told that he needs to drink more water.  He says he mowed the lawn yesterday and thinks made his symptoms worse.  He is now having some nasal congestion with scant discharge, a feeling of phlegm caught in his throat, and profound fatigue.  Patient denies any fever into this point but he is 101 in clinic today.  Physical exam reveals pearly gray tympanic membranes bilaterally with a normal light reflex and clear external auditory canals.  Nasal mucosa is mildly erythematous and edematous with scant nasal discharge.  Oropharyngeal exam is benign.  No cervical lymphadenopathy appreciated exam.  Cardiopulmonary exam reveals fine rales in the left lower lobe but otherwise unremarkable to auscultation.  Will obtain respiratory triplex panel and chest x-ray.  Chest x-ray reviewed and independently evaluated by me.  Interpretation: Questionable left lower lobe infiltrate.  There is also a right questionable perihilar infiltrate but patient does appear rotated on chest x-ray.  Awaiting radiology overread.  Respiratory triplex panel is negative for COVID and influenza.  Radiology interpretation of chest x-ray is there is a streaky left lower and retrocardiac opacity that they feel is more scarring versus infection giving stability.  Given patient's fatigue and fever suspect patient has left lower lobe pneumonia and will treat with 875 twice  daily x7 days and Z-Pak.  We will also give Tessalon Perles and Promethazine DM cough syrup to help with cough.   Final Clinical Impressions(s) / UC  Diagnoses   Final diagnoses:  Pneumonia of left lower lobe due to infectious organism     Discharge Instructions     Your chest x-ray today showed left lower lobe pneumonia and I am going to treat you with 2 antibiotics.  The first antibiotic will be Augmentin 875 mg.  Take it twice daily with food for 7 days.  The second medication is azithromycin which she will take 2 tablets today and then 1 tablet each day after that for total of 5 days.  Use the Tessalon Perles every 8 hours during the day and the Promethazine DM cough syrup at bedtime.  The cough syrup will make you drowsy.  Increase your oral fluid intake to keep your secretions nice and thin and so you can better clear the mucus.  Return for reevaluation, or see your primary care provider, for any new or worsening symptoms.    ED Prescriptions    Medication Sig Dispense Auth. Provider   amoxicillin-clavulanate (AUGMENTIN) 875-125 MG tablet Take 1 tablet by mouth every 12 (twelve) hours. 14 tablet Margarette Canada, NP   azithromycin (ZITHROMAX Z-PAK) 250 MG tablet Take 1 tablet (250 mg total) by mouth daily. Take 2 tablets on the first day and then 1 tablet daily thereafter for a total of 5 days of treatment. 6 tablet Margarette Canada, NP   benzonatate (TESSALON) 100 MG capsule Take 2 capsules (200 mg total) by mouth every 8 (eight) hours. 21 capsule Margarette Canada, NP   promethazine-dextromethorphan (PROMETHAZINE-DM) 6.25-15 MG/5ML syrup Take 5 mLs by mouth 4 (four) times daily as needed. 118 mL Margarette Canada, NP     PDMP not reviewed this encounter.   Margarette Canada, NP 07/15/20 1133

## 2020-07-15 NOTE — Discharge Instructions (Addendum)
Your chest x-ray today showed left lower lobe pneumonia and I am going to treat you with 2 antibiotics.  The first antibiotic will be Augmentin 875 mg.  Take it twice daily with food for 7 days.  The second medication is azithromycin which she will take 2 tablets today and then 1 tablet each day after that for total of 5 days.  Use the Tessalon Perles every 8 hours during the day and the Promethazine DM cough syrup at bedtime.  The cough syrup will make you drowsy.  Increase your oral fluid intake to keep your secretions nice and thin and so you can better clear the mucus.  Return for reevaluation, or see your primary care provider, for any new or worsening symptoms.

## 2020-07-15 NOTE — ED Triage Notes (Signed)
Pt c/o cough , nasal congestion for several days. Pt also has increased weakness. Pt denies f/n/v/d or other symptoms. Pt denies any known sick contacts. Pt did mow his grass yesterday and thinks he did get a little worse, pt denies any history of seasonal allergies.

## 2020-07-17 ENCOUNTER — Emergency Department: Payer: PPO

## 2020-07-17 ENCOUNTER — Inpatient Hospital Stay: Payer: PPO

## 2020-07-17 ENCOUNTER — Other Ambulatory Visit: Payer: Self-pay

## 2020-07-17 ENCOUNTER — Inpatient Hospital Stay
Admission: EM | Admit: 2020-07-17 | Discharge: 2020-08-12 | DRG: 871 | Disposition: E | Payer: PPO | Attending: Internal Medicine | Admitting: Internal Medicine

## 2020-07-17 DIAGNOSIS — I2510 Atherosclerotic heart disease of native coronary artery without angina pectoris: Secondary | ICD-10-CM | POA: Diagnosis present

## 2020-07-17 DIAGNOSIS — Z79899 Other long term (current) drug therapy: Secondary | ICD-10-CM

## 2020-07-17 DIAGNOSIS — N183 Chronic kidney disease, stage 3 unspecified: Secondary | ICD-10-CM | POA: Diagnosis present

## 2020-07-17 DIAGNOSIS — N1832 Chronic kidney disease, stage 3b: Secondary | ICD-10-CM | POA: Diagnosis present

## 2020-07-17 DIAGNOSIS — Z7189 Other specified counseling: Secondary | ICD-10-CM | POA: Diagnosis not present

## 2020-07-17 DIAGNOSIS — H349 Unspecified retinal vascular occlusion: Secondary | ICD-10-CM | POA: Diagnosis present

## 2020-07-17 DIAGNOSIS — D696 Thrombocytopenia, unspecified: Secondary | ICD-10-CM | POA: Diagnosis present

## 2020-07-17 DIAGNOSIS — N179 Acute kidney failure, unspecified: Secondary | ICD-10-CM | POA: Diagnosis not present

## 2020-07-17 DIAGNOSIS — Z515 Encounter for palliative care: Secondary | ICD-10-CM | POA: Diagnosis not present

## 2020-07-17 DIAGNOSIS — Z20822 Contact with and (suspected) exposure to covid-19: Secondary | ICD-10-CM | POA: Diagnosis present

## 2020-07-17 DIAGNOSIS — E7800 Pure hypercholesterolemia, unspecified: Secondary | ICD-10-CM | POA: Diagnosis present

## 2020-07-17 DIAGNOSIS — B9789 Other viral agents as the cause of diseases classified elsewhere: Secondary | ICD-10-CM | POA: Diagnosis present

## 2020-07-17 DIAGNOSIS — R7989 Other specified abnormal findings of blood chemistry: Secondary | ICD-10-CM | POA: Diagnosis present

## 2020-07-17 DIAGNOSIS — J80 Acute respiratory distress syndrome: Secondary | ICD-10-CM | POA: Diagnosis present

## 2020-07-17 DIAGNOSIS — J9601 Acute respiratory failure with hypoxia: Secondary | ICD-10-CM

## 2020-07-17 DIAGNOSIS — E785 Hyperlipidemia, unspecified: Secondary | ICD-10-CM | POA: Diagnosis present

## 2020-07-17 DIAGNOSIS — I5033 Acute on chronic diastolic (congestive) heart failure: Secondary | ICD-10-CM | POA: Diagnosis not present

## 2020-07-17 DIAGNOSIS — A4189 Other specified sepsis: Secondary | ICD-10-CM | POA: Diagnosis present

## 2020-07-17 DIAGNOSIS — Z888 Allergy status to other drugs, medicaments and biological substances status: Secondary | ICD-10-CM

## 2020-07-17 DIAGNOSIS — Z887 Allergy status to serum and vaccine status: Secondary | ICD-10-CM

## 2020-07-17 DIAGNOSIS — M7989 Other specified soft tissue disorders: Secondary | ICD-10-CM

## 2020-07-17 DIAGNOSIS — I214 Non-ST elevation (NSTEMI) myocardial infarction: Secondary | ICD-10-CM

## 2020-07-17 DIAGNOSIS — D72828 Other elevated white blood cell count: Secondary | ICD-10-CM | POA: Diagnosis not present

## 2020-07-17 DIAGNOSIS — J189 Pneumonia, unspecified organism: Secondary | ICD-10-CM | POA: Diagnosis present

## 2020-07-17 DIAGNOSIS — I130 Hypertensive heart and chronic kidney disease with heart failure and stage 1 through stage 4 chronic kidney disease, or unspecified chronic kidney disease: Secondary | ICD-10-CM | POA: Diagnosis present

## 2020-07-17 DIAGNOSIS — M109 Gout, unspecified: Secondary | ICD-10-CM | POA: Diagnosis present

## 2020-07-17 DIAGNOSIS — I700 Atherosclerosis of aorta: Secondary | ICD-10-CM | POA: Diagnosis present

## 2020-07-17 DIAGNOSIS — I480 Paroxysmal atrial fibrillation: Secondary | ICD-10-CM | POA: Diagnosis not present

## 2020-07-17 DIAGNOSIS — E875 Hyperkalemia: Secondary | ICD-10-CM | POA: Diagnosis not present

## 2020-07-17 DIAGNOSIS — Z823 Family history of stroke: Secondary | ICD-10-CM

## 2020-07-17 DIAGNOSIS — Z7902 Long term (current) use of antithrombotics/antiplatelets: Secondary | ICD-10-CM

## 2020-07-17 DIAGNOSIS — I248 Other forms of acute ischemic heart disease: Secondary | ICD-10-CM | POA: Diagnosis present

## 2020-07-17 DIAGNOSIS — R778 Other specified abnormalities of plasma proteins: Secondary | ICD-10-CM | POA: Diagnosis present

## 2020-07-17 DIAGNOSIS — J122 Parainfluenza virus pneumonia: Secondary | ICD-10-CM | POA: Diagnosis present

## 2020-07-17 DIAGNOSIS — B348 Other viral infections of unspecified site: Secondary | ICD-10-CM | POA: Diagnosis not present

## 2020-07-17 DIAGNOSIS — K219 Gastro-esophageal reflux disease without esophagitis: Secondary | ICD-10-CM | POA: Diagnosis present

## 2020-07-17 DIAGNOSIS — Z66 Do not resuscitate: Secondary | ICD-10-CM | POA: Diagnosis present

## 2020-07-17 DIAGNOSIS — J9801 Acute bronchospasm: Secondary | ICD-10-CM | POA: Diagnosis not present

## 2020-07-17 DIAGNOSIS — Z8679 Personal history of other diseases of the circulatory system: Secondary | ICD-10-CM

## 2020-07-17 DIAGNOSIS — Z7982 Long term (current) use of aspirin: Secondary | ICD-10-CM

## 2020-07-17 DIAGNOSIS — T380X5A Adverse effect of glucocorticoids and synthetic analogues, initial encounter: Secondary | ICD-10-CM | POA: Diagnosis not present

## 2020-07-17 DIAGNOSIS — I10 Essential (primary) hypertension: Secondary | ICD-10-CM | POA: Diagnosis not present

## 2020-07-17 DIAGNOSIS — Z8249 Family history of ischemic heart disease and other diseases of the circulatory system: Secondary | ICD-10-CM

## 2020-07-17 DIAGNOSIS — A419 Sepsis, unspecified organism: Secondary | ICD-10-CM | POA: Diagnosis not present

## 2020-07-17 DIAGNOSIS — U071 COVID-19: Secondary | ICD-10-CM | POA: Diagnosis not present

## 2020-07-17 HISTORY — DX: Pneumonia, unspecified organism: J18.9

## 2020-07-17 HISTORY — DX: Gastro-esophageal reflux disease without esophagitis: K21.9

## 2020-07-17 LAB — COMPREHENSIVE METABOLIC PANEL
ALT: 33 U/L (ref 0–44)
AST: 113 U/L — ABNORMAL HIGH (ref 15–41)
Albumin: 3.2 g/dL — ABNORMAL LOW (ref 3.5–5.0)
Alkaline Phosphatase: 67 U/L (ref 38–126)
Anion gap: 12 (ref 5–15)
BUN: 30 mg/dL — ABNORMAL HIGH (ref 8–23)
CO2: 21 mmol/L — ABNORMAL LOW (ref 22–32)
Calcium: 8.6 mg/dL — ABNORMAL LOW (ref 8.9–10.3)
Chloride: 101 mmol/L (ref 98–111)
Creatinine, Ser: 1.78 mg/dL — ABNORMAL HIGH (ref 0.61–1.24)
GFR, Estimated: 36 mL/min — ABNORMAL LOW (ref >60)
Glucose, Bld: 116 mg/dL — ABNORMAL HIGH (ref 70–99)
Potassium: 3.5 mmol/L (ref 3.5–5.1)
Sodium: 134 mmol/L — ABNORMAL LOW (ref 135–145)
Total Bilirubin: 0.9 mg/dL (ref 0.3–1.2)
Total Protein: 6.8 g/dL (ref 6.5–8.1)

## 2020-07-17 LAB — CBC WITH DIFFERENTIAL/PLATELET
Abs Immature Granulocytes: 0.19 10*3/uL — ABNORMAL HIGH (ref 0.00–0.07)
Basophils Absolute: 0.1 10*3/uL (ref 0.0–0.1)
Basophils Relative: 1 %
Eosinophils Absolute: 0 10*3/uL (ref 0.0–0.5)
Eosinophils Relative: 0 %
HCT: 34.6 % — ABNORMAL LOW (ref 39.0–52.0)
Hemoglobin: 10.9 g/dL — ABNORMAL LOW (ref 13.0–17.0)
Immature Granulocytes: 2 %
Lymphocytes Relative: 13 %
Lymphs Abs: 1.5 10*3/uL (ref 0.7–4.0)
MCH: 28.8 pg (ref 26.0–34.0)
MCHC: 31.5 g/dL (ref 30.0–36.0)
MCV: 91.3 fL (ref 80.0–100.0)
Monocytes Absolute: 2.9 10*3/uL — ABNORMAL HIGH (ref 0.1–1.0)
Monocytes Relative: 25 %
Neutro Abs: 6.9 10*3/uL (ref 1.7–7.7)
Neutrophils Relative %: 59 %
Platelets: 87 10*3/uL — ABNORMAL LOW (ref 150–400)
RBC: 3.79 MIL/uL — ABNORMAL LOW (ref 4.22–5.81)
RDW: 19 % — ABNORMAL HIGH (ref 11.5–15.5)
Smear Review: NORMAL
WBC: 11.6 10*3/uL — ABNORMAL HIGH (ref 4.0–10.5)
nRBC: 0 % (ref 0.0–0.2)

## 2020-07-17 LAB — URINALYSIS, COMPLETE (UACMP) WITH MICROSCOPIC
Bacteria, UA: NONE SEEN
Bilirubin Urine: NEGATIVE
Glucose, UA: NEGATIVE mg/dL
Ketones, ur: NEGATIVE mg/dL
Leukocytes,Ua: NEGATIVE
Nitrite: NEGATIVE
Protein, ur: 100 mg/dL — AB
Specific Gravity, Urine: 1.017 (ref 1.005–1.030)
Squamous Epithelial / HPF: NONE SEEN (ref 0–5)
pH: 5 (ref 5.0–8.0)

## 2020-07-17 LAB — PROTIME-INR
INR: 1.3 — ABNORMAL HIGH (ref 0.8–1.2)
Prothrombin Time: 16.4 seconds — ABNORMAL HIGH (ref 11.4–15.2)

## 2020-07-17 LAB — APTT: aPTT: 38 seconds — ABNORMAL HIGH (ref 24–36)

## 2020-07-17 LAB — D-DIMER, QUANTITATIVE: D-Dimer, Quant: 4.26 ug/mL-FEU — ABNORMAL HIGH (ref 0.00–0.50)

## 2020-07-17 LAB — RESP PANEL BY RT-PCR (FLU A&B, COVID) ARPGX2
Influenza A by PCR: NEGATIVE
Influenza B by PCR: NEGATIVE
SARS Coronavirus 2 by RT PCR: NEGATIVE

## 2020-07-17 LAB — TROPONIN I (HIGH SENSITIVITY)
Troponin I (High Sensitivity): 791 ng/L (ref <18)
Troponin I (High Sensitivity): 585 ng/L (ref <18)
Troponin I (High Sensitivity): 563 ng/L (ref <18)
Troponin I (High Sensitivity): 457 ng/L (ref <18)

## 2020-07-17 LAB — BRAIN NATRIURETIC PEPTIDE: B Natriuretic Peptide: 764.7 pg/mL — ABNORMAL HIGH (ref 0.0–100.0)

## 2020-07-17 LAB — LACTIC ACID, PLASMA: Lactic Acid, Venous: 1.6 mmol/L (ref 0.5–1.9)

## 2020-07-17 LAB — PROCALCITONIN: Procalcitonin: 0.65 ng/mL

## 2020-07-17 LAB — MAGNESIUM: Magnesium: 2 mg/dL (ref 1.7–2.4)

## 2020-07-17 IMAGING — CT CT ANGIO CHEST
2 of 6 series · 18 of 46 positions shown · IV contrast (APPLIED)
Comparison: CT angiogram chest [DATE]; chest radiograph
[DATE]

CLINICAL DATA: Cough and shortness of breath

EXAM:
CT ANGIOGRAPHY CHEST WITH CONTRAST
TECHNIQUE: Multidetector CT imaging of the chest was performed using the
standard protocol during bolus administration of intravenous
contrast. Multiplanar CT image reconstructions and MIPs were
obtained to evaluate the vascular anatomy.
CONTRAST:  75mL OMNIPAQUE IOHEXOL 350 MG/ML SOLN

[Series 5: thins · axial · 0.65mm/px · z∈[-829,-609]mm · 15 of 301 slices shown]
[im 13/301  lung]
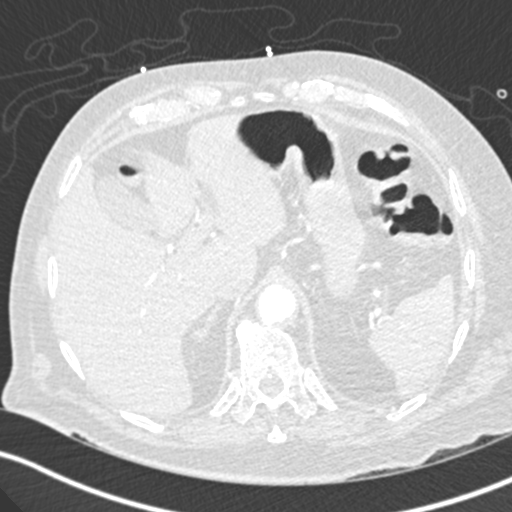
[im 38/301  soft-tissue]
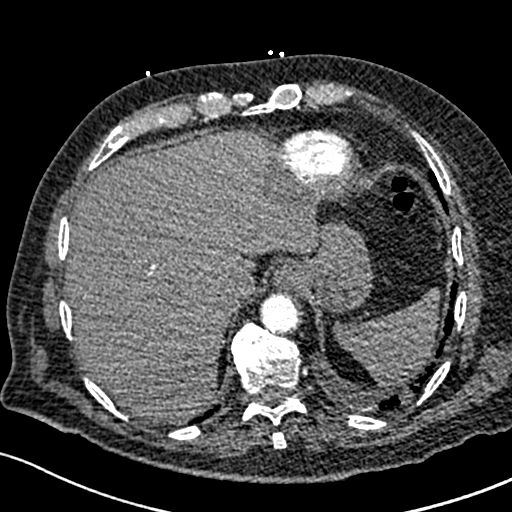
[im 51/301  lung]
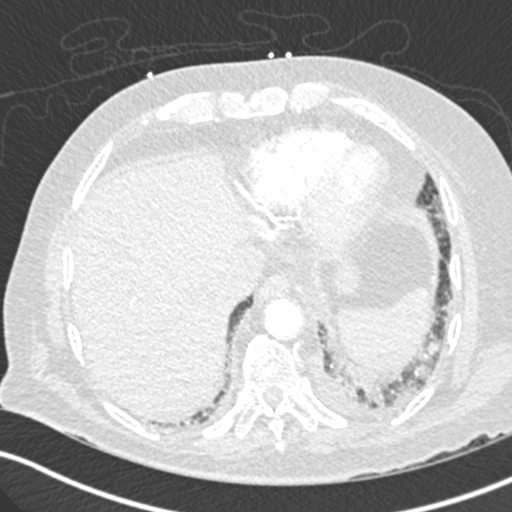
[im 76/301  soft-tissue]
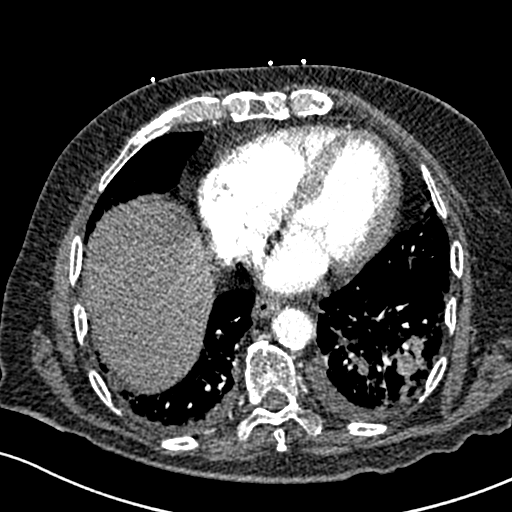
[im 88/301  lung]
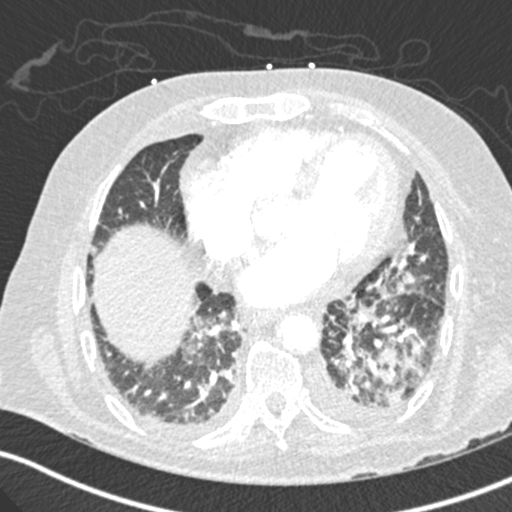
[im 113/301  soft-tissue]
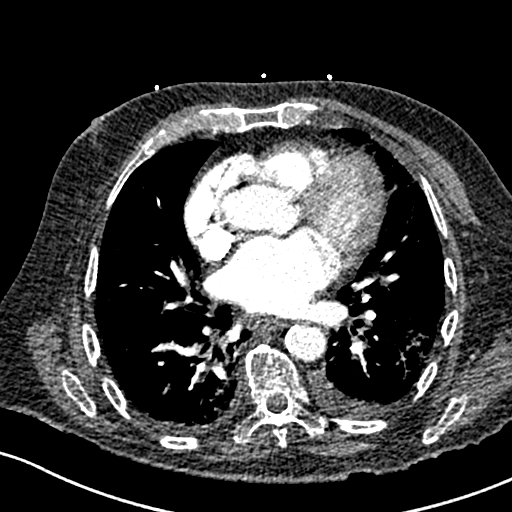
[im 126/301  lung]
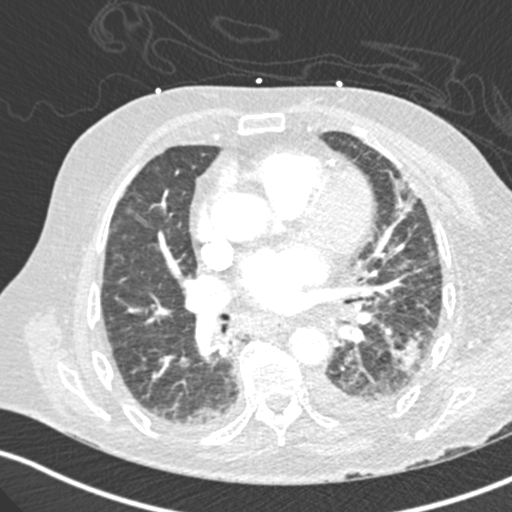
[im 151/301  soft-tissue]
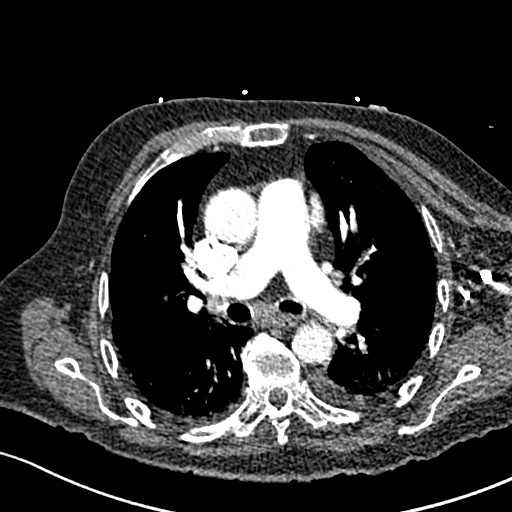
[im 176/301  lung]
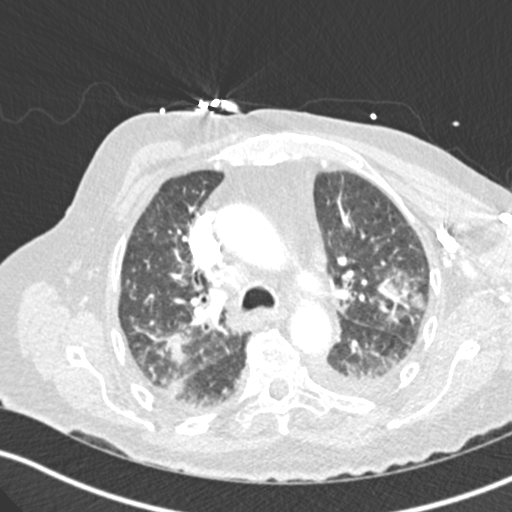
[im 188/301  soft-tissue]
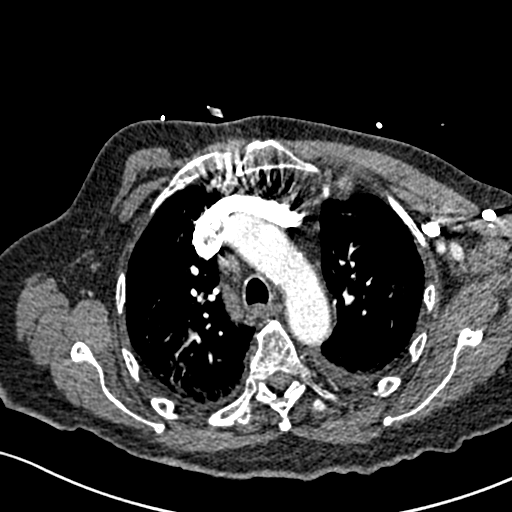
[im 213/301  lung]
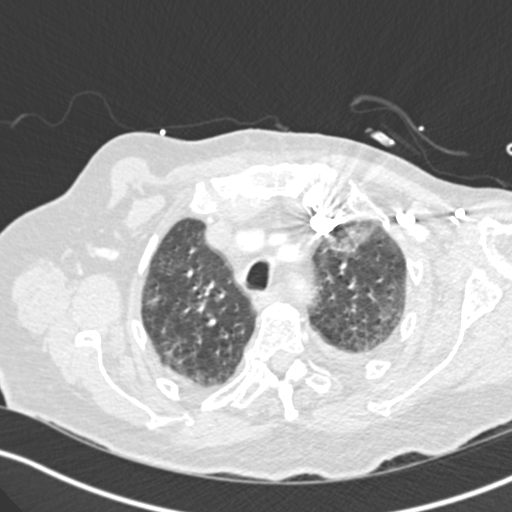
[im 226/301  soft-tissue]
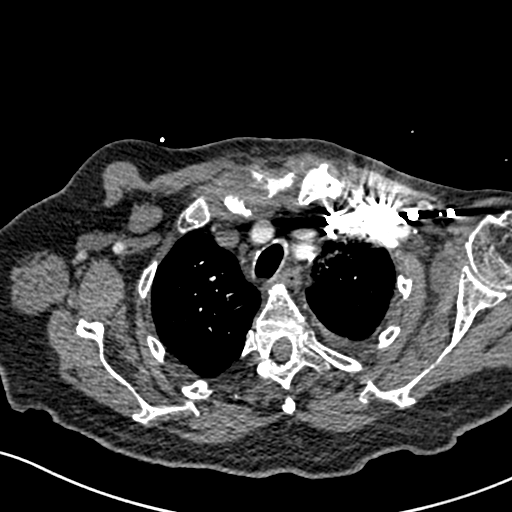
[im 251/301  lung]
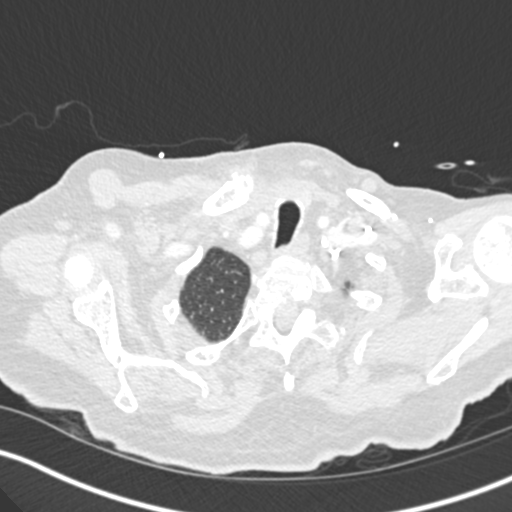
[im 263/301  soft-tissue]
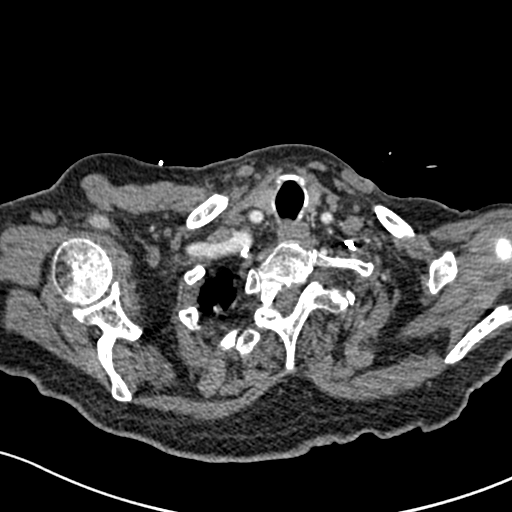
[im 288/301  lung]
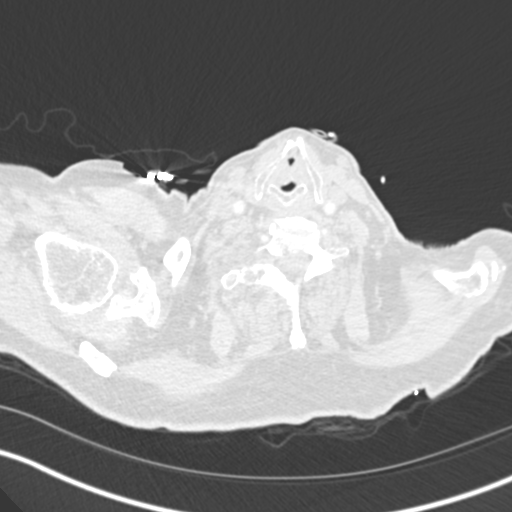

[Series 7: coronal mpr · coronal · 0.50mm/px · 3 of 95 slices shown]
[im 24/95  soft-tissue]
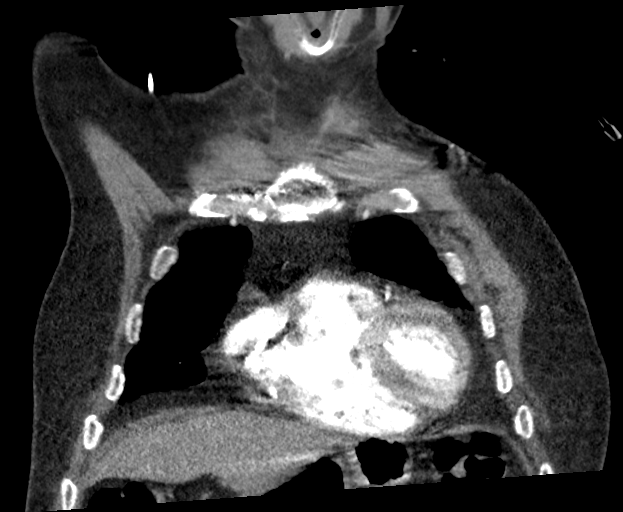
[im 48/95  soft-tissue]
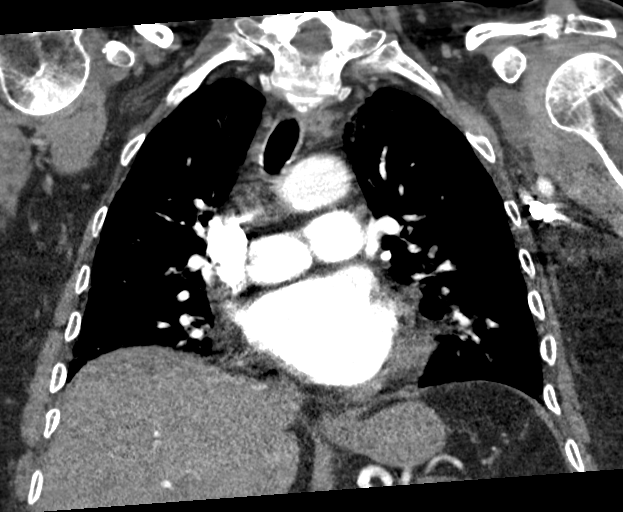
[im 71/95  soft-tissue]
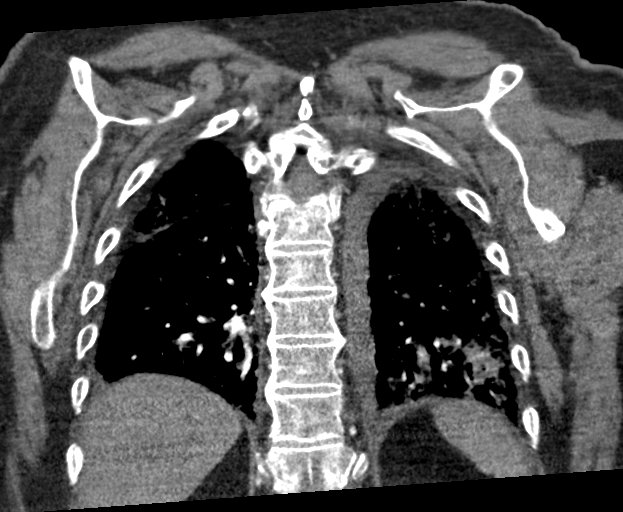

[18 of 46 positions shown; findings below may reference images not displayed]

FINDINGS: Cardiovascular: There is no demonstrable pulmonary embolus. There is
no appreciable thoracic aortic aneurysm or dissection. There are
scattered foci of calcification in visualized great vessels. There
are foci of aortic atherosclerosis. There are multiple foci coronary
artery calcification. There is no pericardial effusion or
pericardial thickening.

Mediastinum/Nodes: No thyroid lesion evident. No appreciable
thoracic adenopathy by size criteria. No esophageal lesions.

Lungs/Pleura: There is a pleural effusion on each side, slightly
larger on the left than the right. There are foci of airspace
consolidation in the left lower lobe. There is also atelectatic
change in each lower lobe and inferior lingular region. In the upper
lobes posteriorly, there is patchy infiltrate on each side. Trachea
and major bronchial structures appear normal. No evident
pneumothorax.

Upper Abdomen: Visualized upper abdominal structures appear
unremarkable

Musculoskeletal: There is degenerative change in the thoracic spine
with diffuse idiopathic skeletal hyperostosis. There is degenerative
change in each shoulder. No blastic or lytic bone lesions. No
appreciable chest wall lesions.

Review of the MIP images confirms the above findings.
IMPRESSION: 1. No evident pulmonary embolus. No thoracic aortic aneurysm or
dissection. There is aortic atherosclerosis as well as foci of great
vessel and coronary artery calcification.

2. Airspace opacity consistent with pneumonia in the left lower lobe
as well as in each upper lobe, primarily posteriorly. Areas of
atelectasis in each lower lobe and inferior lingula noted.

3. Small pleural effusions bilaterally, slightly larger on the left
than on the right.

4.  No evident adenopathy.

5.  Diffuse idiopathic skeletal hyperostosis in the thoracic spine.

Aortic Atherosclerosis ([C0]-[C0]).

## 2020-07-17 IMAGING — DX DG CHEST 1V PORT
1 series · 1 of 1 positions shown · non-contrast
Comparison: [DATE]

CLINICAL DATA: Shortness of breath and wheezing

EXAM:
PORTABLE CHEST 1 VIEW

[chest ap]
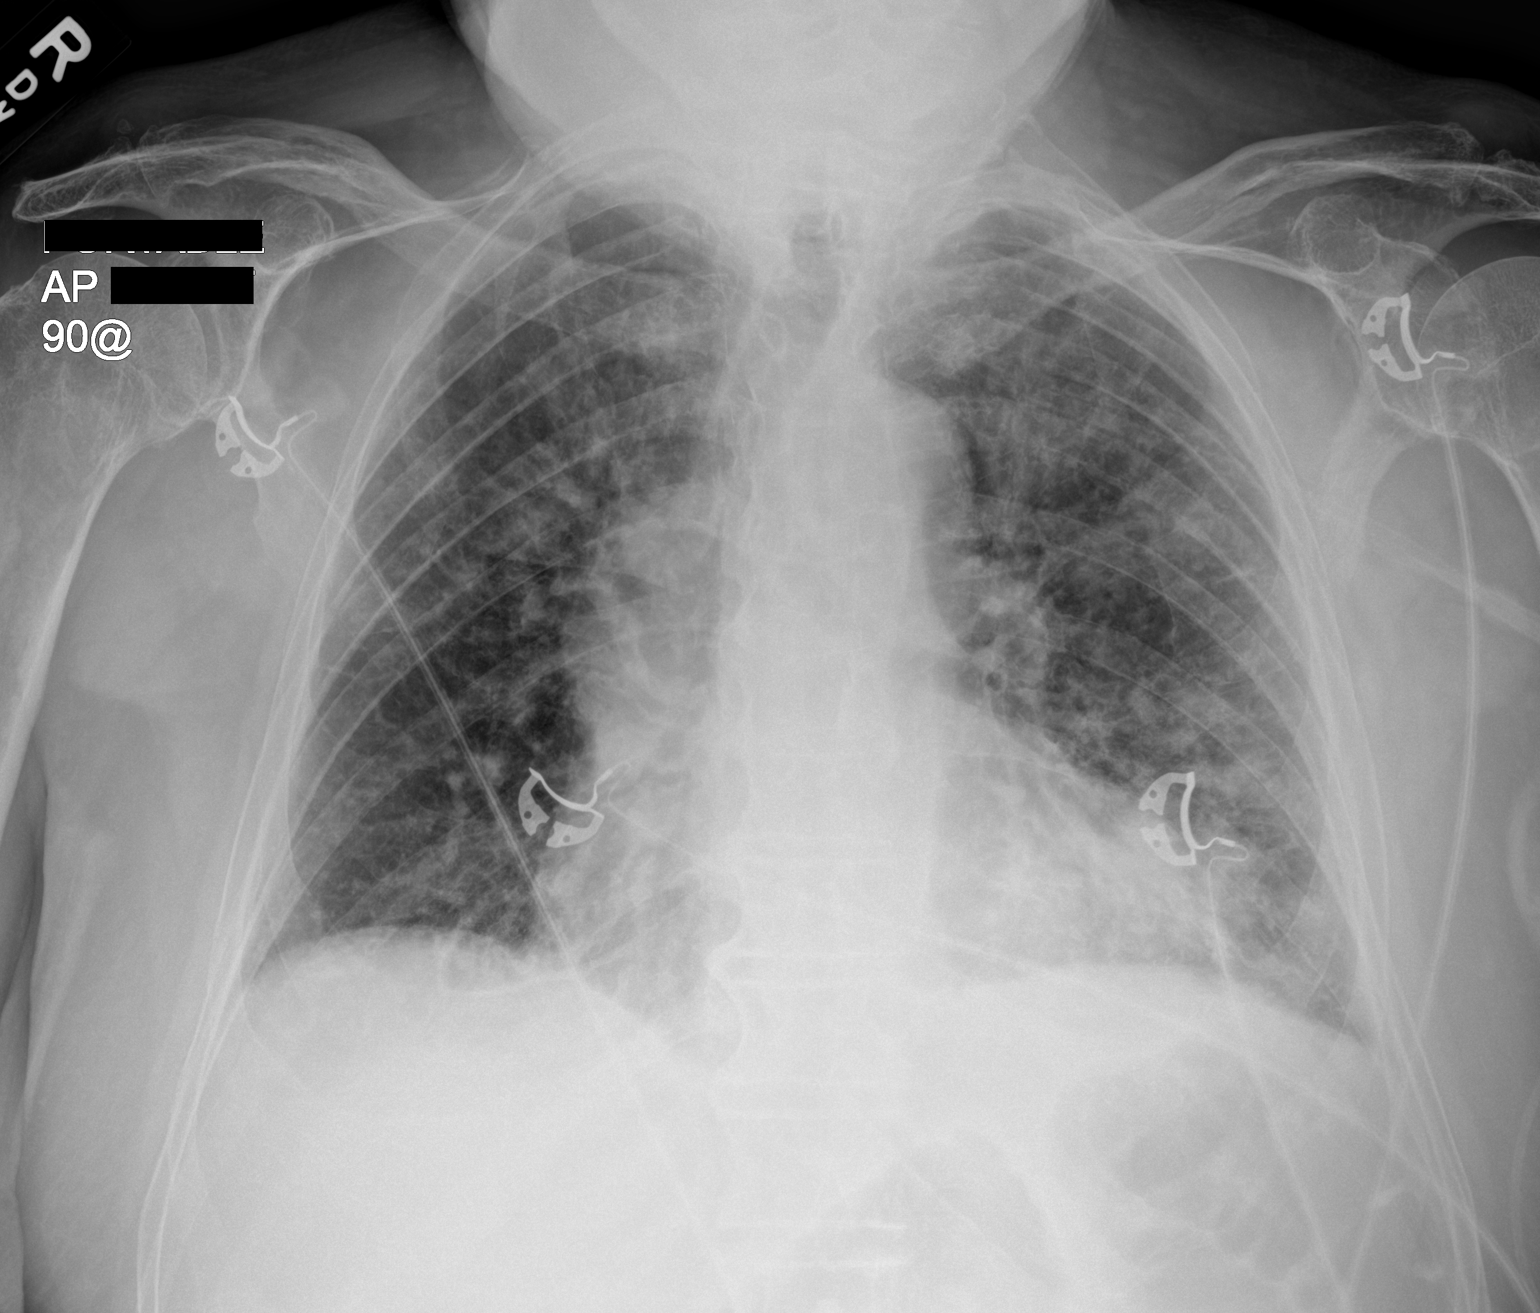

[1 of 1 positions shown; findings below may reference images not displayed]

FINDINGS: There is cardiomegaly with pulmonary venous hypertension. There is
airspace opacity in the left lower lobe and to a lesser degree in
the left upper lobe. There is also subtle ill-defined opacity right
upper lobe. No adenopathy no bone lesions.
IMPRESSION: Cardiomegaly with pulmonary vascular congestion. Airspace opacity in
the left lower lobe as well as more subtly in each upper lobe.
Suspect multifocal pneumonia. Patchy pulmonary edema could present
in this manner. Both edema and pneumonia may present concurrently.

Note that atypical organism pneumonia could present in this manner.
Check of [2H] status may be advised.

## 2020-07-17 IMAGING — US US EXTREM LOW VENOUS
1 series · 13 of 24 positions shown · non-contrast
Comparison: None.

CLINICAL DATA: Shortness of breath with positive D-dimer, concern
for deep vein thrombosis.



[Series 1: us venous img lower bilat (dvt) · portal-venous · 13 of 60 slices shown]
[im 1/60]
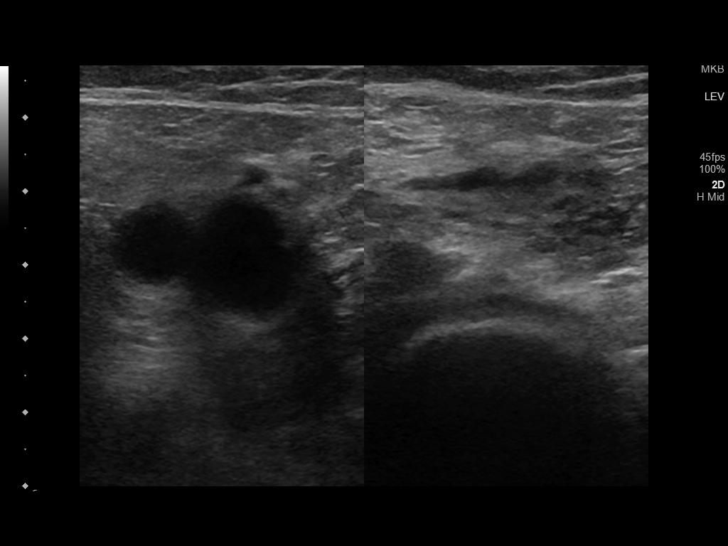
[im 6/60]
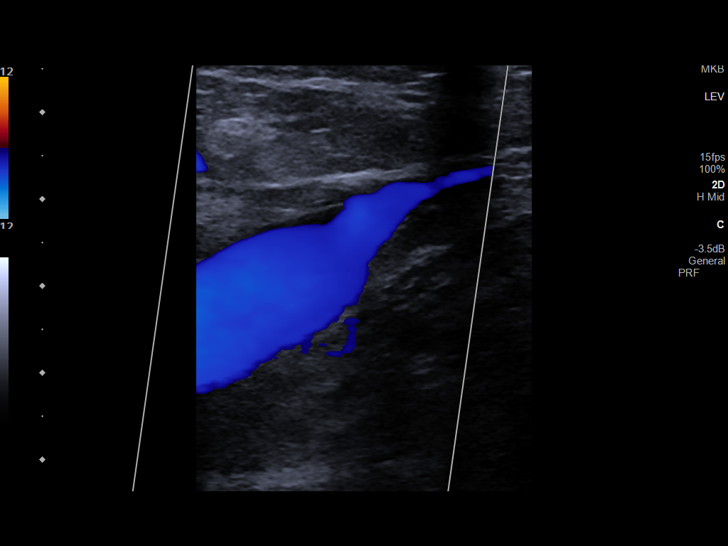
[im 11/60]
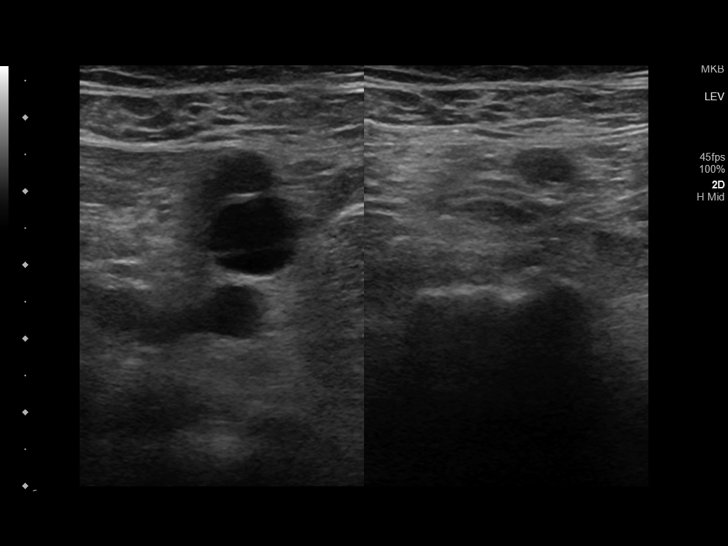
[im 16/60]
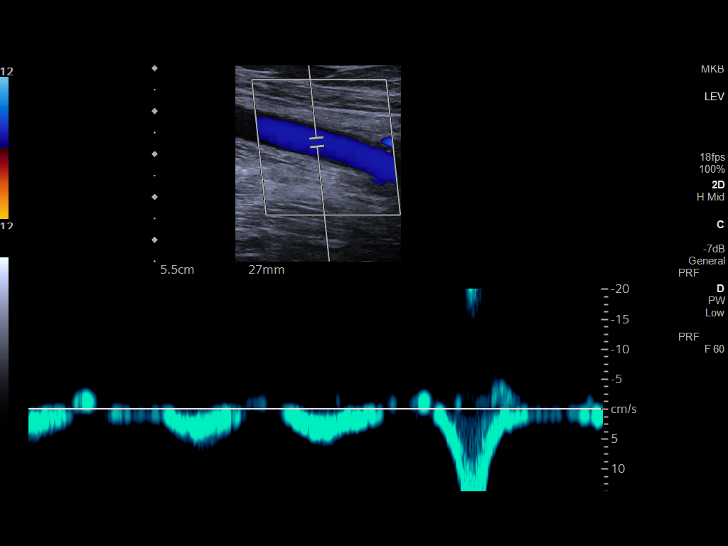
[im 21/60]
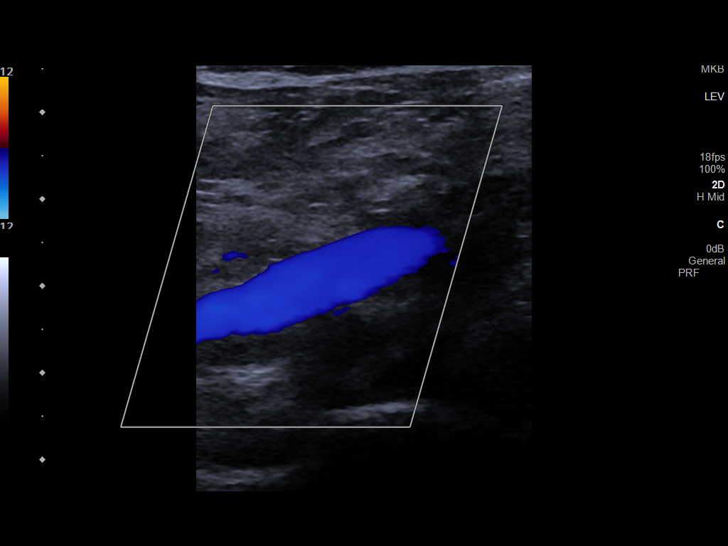
[im 26/60]
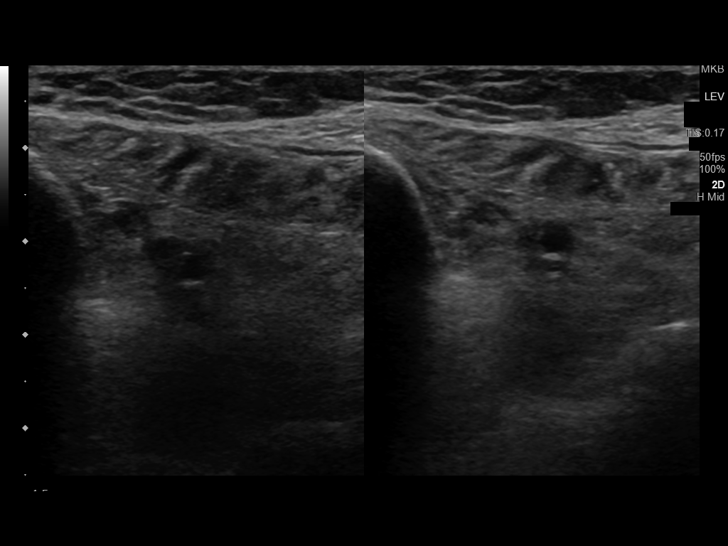
[im 31/60]
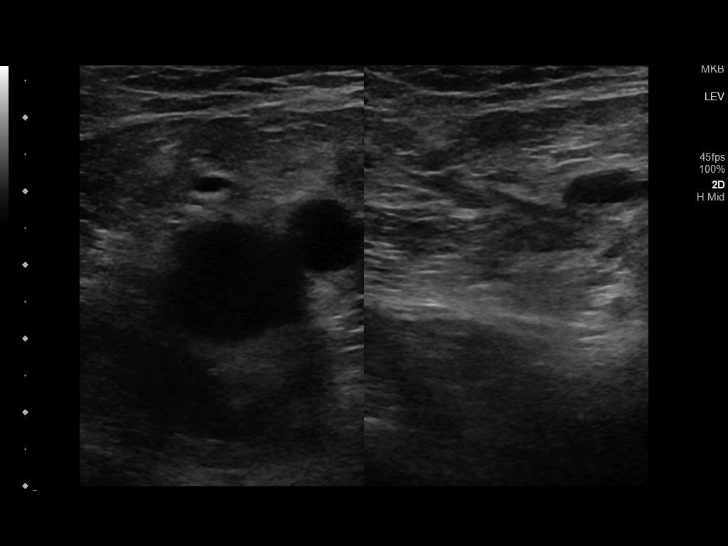
[im 34/60]
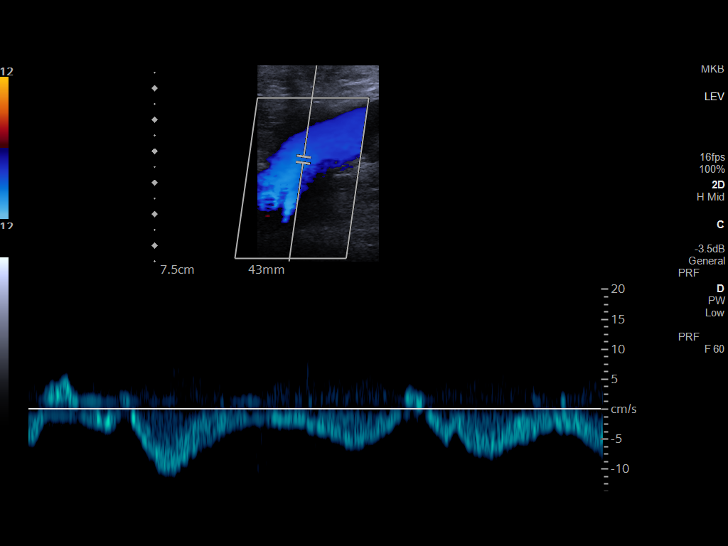
[im 39/60]
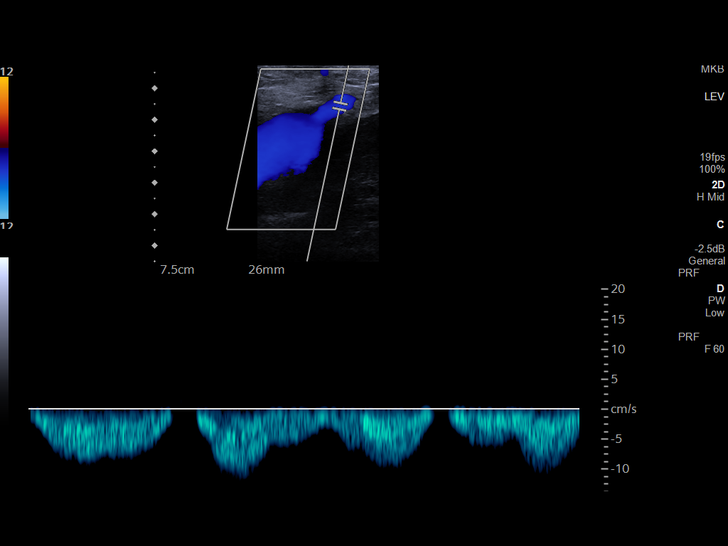
[im 44/60]
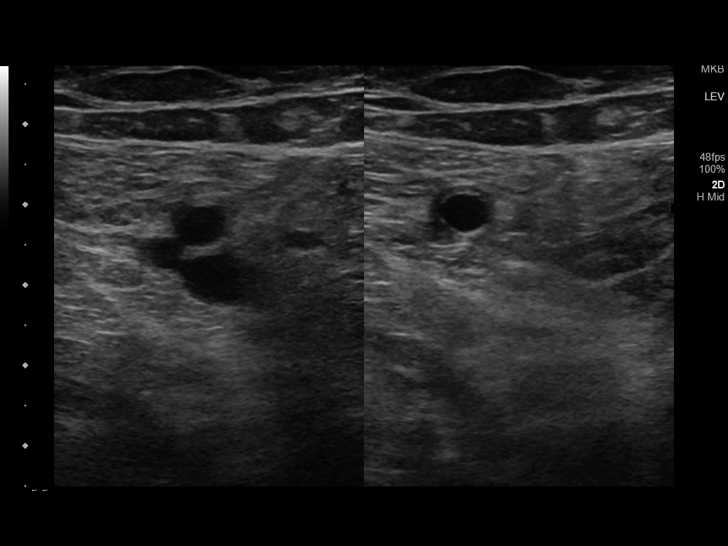
[im 49/60]
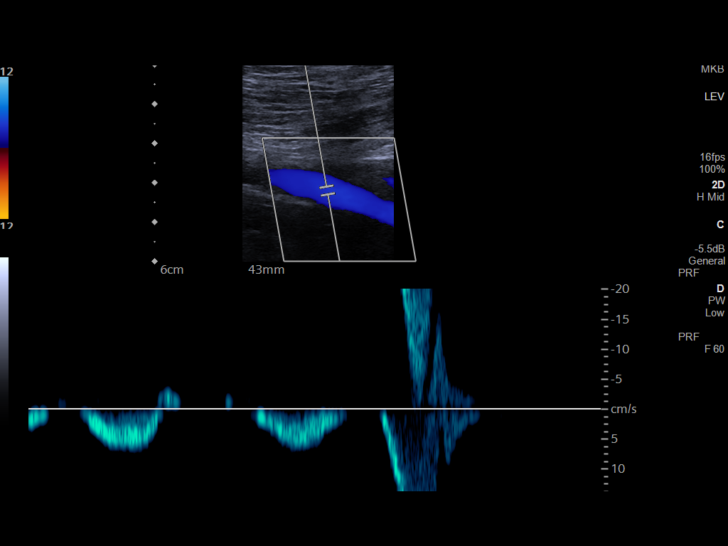
[im 54/60]
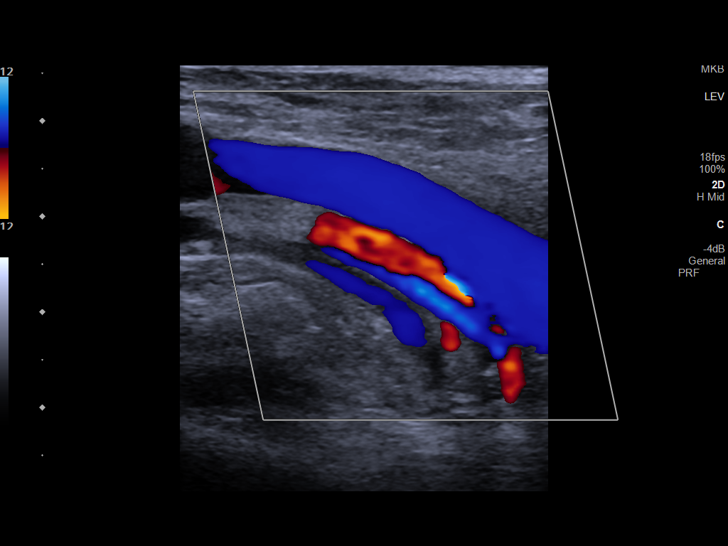
[im 60/60]
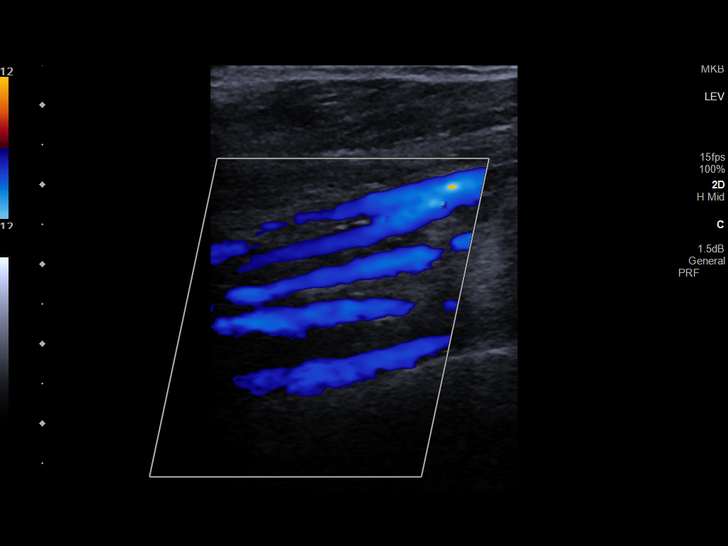

[13 of 24 positions shown; findings below may reference images not displayed]

FINDINGS: RIGHT LOWER EXTREMITY

Common Femoral Vein: No evidence of thrombus. Normal
compressibility, respiratory phasicity and response to augmentation.

Saphenofemoral Junction: No evidence of thrombus. Normal
compressibility and flow on color Doppler imaging.

Profunda Femoral Vein: No evidence of thrombus. Normal
compressibility and flow on color Doppler imaging.

Femoral Vein: No evidence of thrombus. Normal compressibility,
respiratory phasicity and response to augmentation.

Popliteal Vein: No evidence of thrombus. Normal compressibility,
respiratory phasicity and response to augmentation.

Calf Veins: No evidence of thrombus. Normal compressibility and flow
on color Doppler imaging.

Superficial Great Saphenous Vein: No evidence of thrombus. Normal
compressibility.

Venous Reflux:  None.

Other Findings:  None.

LEFT LOWER EXTREMITY

Common Femoral Vein: No evidence of thrombus. Normal
compressibility, respiratory phasicity and response to augmentation.

Saphenofemoral Junction: No evidence of thrombus. Normal
compressibility and flow on color Doppler imaging.

Profunda Femoral Vein: No evidence of thrombus. Normal
compressibility and flow on color Doppler imaging.

Femoral Vein: No evidence of thrombus. Normal compressibility,
respiratory phasicity and response to augmentation.

Popliteal Vein: No evidence of thrombus. Normal compressibility,
respiratory phasicity and response to augmentation.

Calf Veins: No evidence of thrombus. Normal compressibility and flow
on color Doppler imaging.

Superficial Great Saphenous Vein: No evidence of thrombus. Normal
compressibility.

Venous Reflux:  None.

Other Findings:  None.
IMPRESSION: No evidence of deep venous thrombosis in either lower extremity.

## 2020-07-17 MED ORDER — SODIUM CHLORIDE 0.9 % IV SOLN
100.0000 mg | Freq: Two times a day (BID) | INTRAVENOUS | Status: DC
  Administered 2020-07-18 – 2020-07-22 (×9): 100 mg via INTRAVENOUS
  Filled 2020-07-17 (×11): qty 100

## 2020-07-17 MED ORDER — CLOPIDOGREL BISULFATE 75 MG PO TABS
75.0000 mg | ORAL_TABLET | Freq: Every day | ORAL | Status: DC
  Administered 2020-07-17 – 2020-07-19 (×3): 75 mg via ORAL
  Filled 2020-07-17 (×3): qty 1

## 2020-07-17 MED ORDER — GABAPENTIN 300 MG PO CAPS
600.0000 mg | ORAL_CAPSULE | Freq: Every day | ORAL | Status: DC
  Administered 2020-07-17 – 2020-07-25 (×9): 600 mg via ORAL
  Filled 2020-07-17 (×10): qty 2

## 2020-07-17 MED ORDER — VANCOMYCIN HCL 1500 MG/300ML IV SOLN
1500.0000 mg | Freq: Once | INTRAVENOUS | Status: AC
  Administered 2020-07-17: 1500 mg via INTRAVENOUS
  Filled 2020-07-17 (×3): qty 300

## 2020-07-17 MED ORDER — ENOXAPARIN SODIUM 30 MG/0.3ML IJ SOSY
30.0000 mg | PREFILLED_SYRINGE | INTRAMUSCULAR | Status: DC
  Administered 2020-07-18: 30 mg via SUBCUTANEOUS
  Filled 2020-07-17 (×2): qty 0.3

## 2020-07-17 MED ORDER — HEPARIN BOLUS VIA INFUSION: 4000.0000 [IU] | Freq: Once | INTRAVENOUS | Status: DC

## 2020-07-17 MED ORDER — IPRATROPIUM-ALBUTEROL 0.5-2.5 (3) MG/3ML IN SOLN
3.0000 mL | Freq: Four times a day (QID) | RESPIRATORY_TRACT | Status: DC
  Administered 2020-07-17 – 2020-07-19 (×8): 3 mL via RESPIRATORY_TRACT
  Filled 2020-07-17 (×8): qty 3

## 2020-07-17 MED ORDER — SODIUM CHLORIDE 0.9 % IV SOLN
2.0000 g | Freq: Once | INTRAVENOUS | Status: AC
  Administered 2020-07-17: 2 g via INTRAVENOUS
  Filled 2020-07-17 (×2): qty 2

## 2020-07-17 MED ORDER — SENNOSIDES-DOCUSATE SODIUM 8.6-50 MG PO TABS: 0.5000 | ORAL_TABLET | Freq: Every evening | ORAL | Status: DC | PRN

## 2020-07-17 MED ORDER — HEPARIN (PORCINE) 25000 UT/250ML-% IV SOLN: 900.0000 [IU]/h | INTRAVENOUS | Status: DC

## 2020-07-17 MED ORDER — HYDRALAZINE HCL 20 MG/ML IJ SOLN: 5.0000 mg | INTRAMUSCULAR | Status: DC | PRN

## 2020-07-17 MED ORDER — DM-GUAIFENESIN ER 30-600 MG PO TB12: 1.0000 | ORAL_TABLET | Freq: Two times a day (BID) | ORAL | Status: DC | PRN

## 2020-07-17 MED ORDER — SIMVASTATIN 20 MG PO TABS
20.0000 mg | ORAL_TABLET | Freq: Every evening | ORAL | Status: DC
  Administered 2020-07-17 – 2020-07-26 (×10): 20 mg via ORAL
  Filled 2020-07-17: qty 2
  Filled 2020-07-17 (×7): qty 1
  Filled 2020-07-17: qty 2
  Filled 2020-07-17: qty 1

## 2020-07-17 MED ORDER — LACTATED RINGERS IV BOLUS
1000.0000 mL | Freq: Once | INTRAVENOUS | Status: AC
  Administered 2020-07-17: 1000 mL via INTRAVENOUS

## 2020-07-17 MED ORDER — SODIUM CHLORIDE 0.9 % IV SOLN
1.0000 g | INTRAVENOUS | Status: DC
  Administered 2020-07-17 – 2020-07-21 (×5): 1 g via INTRAVENOUS
  Filled 2020-07-17: qty 10
  Filled 2020-07-17: qty 1
  Filled 2020-07-17 (×2): qty 10
  Filled 2020-07-17: qty 1
  Filled 2020-07-17: qty 10

## 2020-07-17 MED ORDER — DOXYCYCLINE HYCLATE 100 MG IV SOLR: 200.0000 mg | Freq: Two times a day (BID) | INTRAVENOUS | Status: DC

## 2020-07-17 MED ORDER — ADULT MULTIVITAMIN W/MINERALS CH
1.0000 | ORAL_TABLET | Freq: Every day | ORAL | Status: DC
  Administered 2020-07-18 – 2020-07-26 (×9): 1 via ORAL
  Filled 2020-07-17 (×9): qty 1

## 2020-07-17 MED ORDER — ALBUTEROL SULFATE (2.5 MG/3ML) 0.083% IN NEBU
2.5000 mg | INHALATION_SOLUTION | RESPIRATORY_TRACT | Status: DC | PRN
  Administered 2020-07-19 – 2020-07-21 (×3): 2.5 mg via RESPIRATORY_TRACT
  Filled 2020-07-17 (×4): qty 3

## 2020-07-17 MED ORDER — ONDANSETRON HCL 4 MG/2ML IJ SOLN: 4.0000 mg | Freq: Three times a day (TID) | INTRAMUSCULAR | Status: DC | PRN

## 2020-07-17 MED ORDER — SODIUM CHLORIDE 0.9 % IV SOLN
500.0000 mg | INTRAVENOUS | Status: DC
  Administered 2020-07-17: 500 mg via INTRAVENOUS
  Filled 2020-07-17: qty 500

## 2020-07-17 MED ORDER — ASPIRIN 81 MG PO CHEW: 324.0000 mg | CHEWABLE_TABLET | Freq: Once | ORAL | Status: DC

## 2020-07-17 MED ORDER — ACETAMINOPHEN 325 MG PO TABS
650.0000 mg | ORAL_TABLET | Freq: Four times a day (QID) | ORAL | Status: DC | PRN
  Administered 2020-07-18: 650 mg via ORAL
  Filled 2020-07-17: qty 2

## 2020-07-17 MED ORDER — IOHEXOL 350 MG/ML SOLN
75.0000 mL | Freq: Once | INTRAVENOUS | Status: AC | PRN
  Administered 2020-07-17: 75 mL via INTRAVENOUS

## 2020-07-17 MED ORDER — VITAMIN D 25 MCG (1000 UNIT) PO TABS
5000.0000 [IU] | ORAL_TABLET | ORAL | Status: DC
  Administered 2020-07-18 – 2020-07-25 (×3): 5000 [IU] via ORAL
  Filled 2020-07-17 (×3): qty 5

## 2020-07-17 MED ORDER — ASPIRIN 81 MG PO CHEW
324.0000 mg | CHEWABLE_TABLET | Freq: Once | ORAL | Status: AC
  Administered 2020-07-17: 324 mg via ORAL
  Filled 2020-07-17: qty 4

## 2020-07-17 MED ORDER — ACETAMINOPHEN 500 MG PO TABS
1000.0000 mg | ORAL_TABLET | Freq: Once | ORAL | Status: AC
  Administered 2020-07-17: 1000 mg via ORAL
  Filled 2020-07-17: qty 2

## 2020-07-17 NOTE — H&P (Signed)
History and Physical    Aaron Burton HGD:[id number] DOB: 06-27-1928 DOA: 07/30/2020  Referring MD/NP/PA:   PCP: Dion Body, MD   Patient coming from:  The patient is coming from home.  At baseline, pt is independent for most of ADL.        Chief Complaint: Cough, shortness of breath, fever, chills  HPI: Aaron Burton is a 87 y.o. male with medical history significant of hypertension, hyperlipidemia, GERD, gout, CAD, angioedema, CKD stage IIIb, thrombocytopenia, who presents with cough, shortness of breath, fever and chills.  Patient states he has been having cough and shortness of breath for almost a week. He was diagnosed with pneumonia and started azithromycin and Augmentin on 6/3.  Patient states that he is taking antibiotics, but symptoms have been worsening.  Patient has productive cough with yellow-colored mucus production.  Patient also has fever and chills.  Denies chest pain.  No nausea, vomiting, diarrhea or abdominal pain.  No symptoms of UTI.  ED Course: pt was found to have WBC 11.6, BNP 764, lactic acid 1.6, INR 1.3, PTT 38, troponin level 791, negative COVID PCR, positive D-dimer 4.26, renal function worsening than baseline, temperature 102.9, blood pressure 117/55, heart rate 111, RR 27, oxygen saturation 80% on room air, which improved to 93% on 4 L oxygen.  Chest showed cardiomegaly, vascular congestion, infiltration in left lower lobe and bilateral upper lobe.  CT angiogram is negative for PE, but showed infiltration in left lower lobe and bilateral upper lobe.  Patient is admitted to progressive bed as inpatient.   Review of Systems:   General: has fevers, chills, no body weight gain, has poor appetite, has fatigue HEENT: no blurry vision, hearing changes or sore throat Respiratory: has dyspnea, coughing, no wheezing CV: no chest pain, no palpitations GI: no nausea, vomiting, abdominal pain, diarrhea, constipation GU: no dysuria, burning on urination, increased  urinary frequency, hematuria  Ext: no leg edema Neuro: no unilateral weakness, numbness, or tingling, no vision change or hearing loss Skin: no rash, no skin tear. MSK: No muscle spasm, no deformity, no limitation of range of movement in spin Heme: No easy bruising.  Travel history: No recent long distant travel.  Allergy:  Allergies  Allergen Reactions  . Ace Inhibitors Swelling    Facial swelling  . Haemophilus Influenzae Vaccines     pna after receving    Past Medical History:  Diagnosis Date  . Coronary artery disease   . Gout   . Hypercholesterolemia   . Hypertension     Past Surgical History:  Procedure Laterality Date  . ACHILLES TENDON REPAIR    . CAROTID ARTERY ANGIOPLASTY    . COLON SURGERY    . LEG SURGERY    . LITHOTRIPSY      Social History:  reports that he has never smoked. He has never used smokeless tobacco. He reports that he does not drink alcohol and does not use drugs.  Family History:  Family History  Problem Relation Age of Onset  . Stroke Mother   . Hypertension Father   . Heart disease Father      Prior to Admission medications   Medication Sig Start Date End Date Taking? Authorizing Provider  amLODipine (NORVASC) 5 MG tablet Take 1 tablet by mouth daily. 02/18/17   [provider]  amoxicillin-clavulanate (AUGMENTIN) 875-125 MG tablet Take 1 tablet by mouth every 12 (twelve) hours. 07/15/20   Margarette Canada, NP  aspirin EC 81 MG tablet Take 1  tablet by mouth daily.    [provider]  azithromycin (ZITHROMAX Z-PAK) 250 MG tablet Take 1 tablet (250 mg total) by mouth daily. Take 2 tablets on the first day and then 1 tablet daily thereafter for a total of 5 days of treatment. 07/15/20   Margarette Canada, NP  benzonatate (TESSALON) 100 MG capsule Take 2 capsules (200 mg total) by mouth every 8 (eight) hours. 07/15/20   Margarette Canada, NP  Cholecalciferol 125 MCG (5000 UT) TABS Take 5,000 Units by mouth 2 (two) times a week.    [provider]  clopidogrel (PLAVIX) 75 MG tablet Take 1 tablet by mouth daily. 07/12/20   [provider]  diphenhydrAMINE (BENADRYL) 25 mg capsule Take 1 capsule (25 mg total) by mouth 3 (three) times daily for 5 days. 07/10/18 07/15/18  Fritzi Mandes, MD  famotidine (PEPCID) 10 MG tablet Take 1 tablet (10 mg total) by mouth 2 (two) times daily. 07/10/18   Fritzi Mandes, MD  gabapentin (NEURONTIN) 300 MG capsule Take 2 capsules by mouth at bedtime. 07/12/20 07/12/21  [provider]  HYDROcodone-acetaminophen (NORCO/VICODIN) 5-325 MG tablet Take 1 tablet by mouth every 6 (six) hours as needed for severe pain. 01/24/20   Lavonia Drafts, MD  methocarbamol (ROBAXIN) 500 MG tablet Take 1 tablet (500 mg total) by mouth 2 (two) times daily. 03/02/20   Margarette Canada, NP  Multiple Vitamin (MULTI-VITAMINS) TABS Take 1 tablet by mouth daily.    [provider]  promethazine-dextromethorphan (PROMETHAZINE-DM) 6.25-15 MG/5ML syrup Take 5 mLs by mouth 4 (four) times daily as needed. 07/15/20   Margarette Canada, NP  senna-docusate (SENOKOT-S) 8.6-50 MG tablet Take 0.5 tablets by mouth every evening.    [provider]  simvastatin (ZOCOR) 20 MG tablet Take 20 mg by mouth every evening.     [provider]  triamcinolone cream (KENALOG) 0.1 % Apply 1 application topically 2 (two) times daily. 05/19/18   [provider]  colchicine 0.6 MG tablet Take 1 tablet (0.6 mg total) by mouth daily as needed (gout). 07/10/18 12/26/19  Fritzi Mandes, MD    Physical Exam: Vitals:   07/24/2020 1515 07/28/2020 1530 08/08/2020 1600 07/14/2020 1630  BP: 128/67 117/71 114/67 110/66  Pulse: 89 80 80 81  Resp: (!) 30 (!) 22 18 19   Temp:      TempSrc:      SpO2: 97% 96% 97% 96%  Weight:      Height:       General: Not in acute distress HEENT:       Eyes: PERRL, EOMI, no scleral icterus.       ENT: No discharge from the ears and nose, no pharynx injection, no tonsillar enlargement.        Neck: No  JVD, no bruit, no mass felt. Heme: No neck lymph node enlargement. Cardiac: S1/S2, RRR, No murmurs, No gallops or rubs. Respiratory: Diffuse rhonchi bilaterally  GI: Soft, nondistended, nontender, no rebound pain, no organomegaly, BS present. GU: No hematuria Ext: No pitting leg edema bilaterally. 1+DP/PT pulse bilaterally. Musculoskeletal: No joint deformities, No joint redness or warmth, no limitation of ROM in spin. Skin: No rashes.  Neuro: Alert, oriented X3, cranial nerves II-XII grossly intact, moves all extremities normally.  Psych: Patient is not psychotic, no suicidal or hemocidal ideation.  Labs on Admission: I have personally reviewed following labs and imaging studies  CBC: Recent Labs  Lab 07/16/2020 0815  WBC 11.6*  NEUTROABS 6.9  HGB 10.9*  HCT 34.6*  MCV 91.3  PLT 87*   Basic Metabolic Panel: Recent Labs  Lab 07/16/2020 0815  NA 134*  K 3.5  CL 101  CO2 21*  GLUCOSE 116*  BUN 30*  CREATININE 1.78*  CALCIUM 8.6*  MG 2.0   GFR: Estimated Creatinine Clearance: 25.4 mL/min (A) (by C-G formula based on SCr of 1.78 mg/dL (H)). Liver Function Tests: Recent Labs  Lab 08/09/2020 0815  AST 113*  ALT 33  ALKPHOS 67  BILITOT 0.9  PROT 6.8  ALBUMIN 3.2*   No results for input(s): LIPASE, AMYLASE in the last 168 hours. No results for input(s): AMMONIA in the last 168 hours. Coagulation Profile: Recent Labs  Lab 07/29/2020 0815  INR 1.3*   Cardiac Enzymes: No results for input(s): CKTOTAL, CKMB, CKMBINDEX, TROPONINI in the last 168 hours. BNP (last 3 results) No results for input(s): PROBNP in the last 8760 hours. HbA1C: No results for input(s): HGBA1C in the last 72 hours. CBG: No results for input(s): GLUCAP in the last 168 hours. Lipid Profile: No results for input(s): CHOL, HDL, LDLCALC, TRIG, CHOLHDL, LDLDIRECT in the last 72 hours. Thyroid Function Tests: No results for input(s): TSH, T4TOTAL, FREET4, T3FREE, THYROIDAB in the last 72  hours. Anemia Panel: No results for input(s): VITAMINB12, FOLATE, FERRITIN, TIBC, IRON, RETICCTPCT in the last 72 hours. Urine analysis:    Component Value Date/Time   COLORURINE YELLOW (A) 07/25/2020 0815   APPEARANCEUR HAZY (A) 08/09/2020 0815   LABSPEC 1.017 07/16/2020 0815   PHURINE 5.0 08/07/2020 0815   GLUCOSEU NEGATIVE 07/23/2020 0815   HGBUR MODERATE (A) 08/03/2020 0815   BILIRUBINUR NEGATIVE 08/03/2020 0815   KETONESUR NEGATIVE 07/18/2020 0815   PROTEINUR 100 (A) 08/07/2020 0815   NITRITE NEGATIVE 07/31/2020 0815   LEUKOCYTESUR NEGATIVE 08/10/2020 0815   Sepsis Labs: @LABRCNTIP (procalcitonin:4,lacticidven:4) ) Recent Results (from the past 240 hour(s))  Resp Panel by RT-PCR (Flu A&B, Covid) Nasopharyngeal Swab     Status: None   Collection Time: 07/15/20 10:14 AM   Specimen: Nasopharyngeal Swab; Nasopharyngeal(NP) swabs in vial transport medium  Result Value Ref Range Status   SARS Coronavirus 2 by RT PCR NEGATIVE NEGATIVE Final    Comment: (NOTE) SARS-CoV-2 target nucleic acids are NOT DETECTED.  The SARS-CoV-2 RNA is generally detectable in upper respiratory specimens during the acute phase of infection. The lowest concentration of SARS-CoV-2 viral copies this assay can detect is 138 copies/mL. A negative result does not preclude SARS-Cov-2 infection and should not be used as the sole basis for treatment or other patient management decisions. A negative result may occur with  improper specimen collection/handling, submission of specimen other than nasopharyngeal swab, presence of viral mutation(s) within the areas targeted by this assay, and inadequate number of viral copies(<138 copies/mL). A negative result must be combined with clinical observations, patient history, and epidemiological information. The expected result is Negative.  Fact Sheet for Patients:  EntrepreneurPulse.com.au  Fact Sheet for Healthcare Providers:   IncredibleEmployment.be  This test is no t yet approved or cleared by the [place] FDA and  has been authorized for detection and/or diagnosis of SARS-CoV-2 by FDA under an Emergency Use Authorization (EUA). This EUA will remain  in effect (meaning this test can be used) for the duration of the COVID-19 declaration under Section 564(b)(1) of the Act, 21 U.S.C.section 360bbb-3(b)(1), unless the authorization is terminated  or revoked sooner.       Influenza A by PCR NEGATIVE NEGATIVE Final   Influenza B by  PCR NEGATIVE NEGATIVE Final    Comment: (NOTE) The Xpert Xpress SARS-CoV-2/FLU/RSV plus assay is intended as an aid in the diagnosis of influenza from Nasopharyngeal swab specimens and should not be used as a sole basis for treatment. Nasal washings and aspirates are unacceptable for Xpert Xpress SARS-CoV-2/FLU/RSV testing.  Fact Sheet for Patients: EntrepreneurPulse.com.au  Fact Sheet for Healthcare Providers: IncredibleEmployment.be  This test is not yet approved or cleared by the [place] FDA and has been authorized for detection and/or diagnosis of SARS-CoV-2 by FDA under an Emergency Use Authorization (EUA). This EUA will remain in effect (meaning this test can be used) for the duration of the COVID-19 declaration under Section 564(b)(1) of the Act, 21 U.S.C. section 360bbb-3(b)(1), unless the authorization is terminated or revoked.  Performed at [facility], [address] [place], [place] [address]   Resp Panel by RT-PCR (Flu A&B, Covid)     Status: None   Collection Time: 07/24/2020  8:15 AM   Specimen: Nasopharyngeal(NP) swabs in vial transport medium  Result Value Ref Range Status   SARS Coronavirus 2 by RT PCR NEGATIVE NEGATIVE Final    Comment: (NOTE) SARS-CoV-2 target nucleic acids are NOT DETECTED.  The SARS-CoV-2 RNA is generally detectable in upper respiratory specimens during  the acute phase of infection. The lowest concentration of SARS-CoV-2 viral copies this assay can detect is 138 copies/mL. A negative result does not preclude SARS-Cov-2 infection and should not be used as the sole basis for treatment or other patient management decisions. A negative result may occur with  improper specimen collection/handling, submission of specimen other than nasopharyngeal swab, presence of viral mutation(s) within the areas targeted by this assay, and inadequate number of viral copies(<138 copies/mL). A negative result must be combined with clinical observations, patient history, and epidemiological information. The expected result is Negative.  Fact Sheet for Patients:  EntrepreneurPulse.com.au  Fact Sheet for Healthcare Providers:  IncredibleEmployment.be  This test is no t yet approved or cleared by the [place] FDA and  has been authorized for detection and/or diagnosis of SARS-CoV-2 by FDA under an Emergency Use Authorization (EUA). This EUA will remain  in effect (meaning this test can be used) for the duration of the COVID-19 declaration under Section 564(b)(1) of the Act, 21 U.S.C.section 360bbb-3(b)(1), unless the authorization is terminated  or revoked sooner.       Influenza A by PCR NEGATIVE NEGATIVE Final   Influenza B by PCR NEGATIVE NEGATIVE Final    Comment: (NOTE) The Xpert Xpress SARS-CoV-2/FLU/RSV plus assay is intended as an aid in the diagnosis of influenza from Nasopharyngeal swab specimens and should not be used as a sole basis for treatment. Nasal washings and aspirates are unacceptable for Xpert Xpress SARS-CoV-2/FLU/RSV testing.  Fact Sheet for Patients: EntrepreneurPulse.com.au  Fact Sheet for Healthcare Providers: IncredibleEmployment.be  This test is not yet approved or cleared by the [place] FDA and has been authorized for detection and/or  diagnosis of SARS-CoV-2 by FDA under an Emergency Use Authorization (EUA). This EUA will remain in effect (meaning this test can be used) for the duration of the COVID-19 declaration under Section 564(b)(1) of the Act, 21 U.S.C. section 360bbb-3(b)(1), unless the authorization is terminated or revoked.  Performed at [facility], [address] [place], [place] [address]      Radiological Exams on Admission: CT Angio Chest PE W and/or Wo Contrast  Result Date: 07/16/2020 CLINICAL DATA:  Cough and shortness of breath EXAM: CT ANGIOGRAPHY  CHEST WITH CONTRAST TECHNIQUE: Multidetector CT imaging of the chest was performed using the standard protocol during bolus administration of intravenous contrast. Multiplanar CT image reconstructions and MIPs were obtained to evaluate the vascular anatomy. CONTRAST:  13mL OMNIPAQUE IOHEXOL 350 MG/ML SOLN COMPARISON:  CT angiogram chest January 24, 2020; chest radiograph July 17, 2020 FINDINGS: Cardiovascular: There is no demonstrable pulmonary embolus. There is no appreciable thoracic aortic aneurysm or dissection. There are scattered foci of calcification in visualized great vessels. There are foci of aortic atherosclerosis. There are multiple foci coronary artery calcification. There is no pericardial effusion or pericardial thickening. Mediastinum/Nodes: No thyroid lesion evident. No appreciable thoracic adenopathy by size criteria. No esophageal lesions. Lungs/Pleura: There is a pleural effusion on each side, slightly larger on the left than the right. There are foci of airspace consolidation in the left lower lobe. There is also atelectatic change in each lower lobe and inferior lingular region. In the upper lobes posteriorly, there is patchy infiltrate on each side. Trachea and major bronchial structures appear normal. No evident pneumothorax. Upper Abdomen: Visualized upper abdominal structures appear unremarkable Musculoskeletal: There is  degenerative change in the thoracic spine with diffuse idiopathic skeletal hyperostosis. There is degenerative change in each shoulder. No blastic or lytic bone lesions. No appreciable chest wall lesions. Review of the MIP images confirms the above findings. IMPRESSION: 1. No evident pulmonary embolus. No thoracic aortic aneurysm or dissection. There is aortic atherosclerosis as well as foci of great vessel and coronary artery calcification. 2. Airspace opacity consistent with pneumonia in the left lower lobe as well as in each upper lobe, primarily posteriorly. Areas of atelectasis in each lower lobe and inferior lingula noted. 3. Small pleural effusions bilaterally, slightly larger on the left than on the right. 4.  No evident adenopathy. 5.  Diffuse idiopathic skeletal hyperostosis in the thoracic spine. Aortic Atherosclerosis (ICD10-I70.0). Electronically Signed   By: Lowella Grip III M.D.   On: 07/16/2020 11:16   US Venous Img Lower Bilateral (DVT)  Result Date: 07/25/2020 CLINICAL DATA:  Shortness of breath with positive D-dimer, concern for deep vein thrombosis. EXAM: BILATERAL LOWER EXTREMITY VENOUS DOPPLER ULTRASOUND TECHNIQUE: Gray-scale sonography with graded compression, as well as color Doppler and duplex ultrasound were performed to evaluate the lower extremity deep venous systems from the level of the common femoral vein and including the common femoral, femoral, profunda femoral, popliteal and calf veins including the posterior tibial, peroneal and gastrocnemius veins when visible. The superficial great saphenous vein was also interrogated. Spectral Doppler was utilized to evaluate flow at rest and with distal augmentation maneuvers in the common femoral, femoral and popliteal veins. COMPARISON:  None. FINDINGS: RIGHT LOWER EXTREMITY Common Femoral Vein: No evidence of thrombus. Normal compressibility, respiratory phasicity and response to augmentation. Saphenofemoral Junction: No evidence of  thrombus. Normal compressibility and flow on color Doppler imaging. Profunda Femoral Vein: No evidence of thrombus. Normal compressibility and flow on color Doppler imaging. Femoral Vein: No evidence of thrombus. Normal compressibility, respiratory phasicity and response to augmentation. Popliteal Vein: No evidence of thrombus. Normal compressibility, respiratory phasicity and response to augmentation. Calf Veins: No evidence of thrombus. Normal compressibility and flow on color Doppler imaging. Superficial Great Saphenous Vein: No evidence of thrombus. Normal compressibility. Venous Reflux:  None. Other Findings:  None. LEFT LOWER EXTREMITY Common Femoral Vein: No evidence of thrombus. Normal compressibility, respiratory phasicity and response to augmentation. Saphenofemoral Junction: No evidence of thrombus. Normal compressibility and flow on color Doppler imaging. Profunda Femoral  Vein: No evidence of thrombus. Normal compressibility and flow on color Doppler imaging. Femoral Vein: No evidence of thrombus. Normal compressibility, respiratory phasicity and response to augmentation. Popliteal Vein: No evidence of thrombus. Normal compressibility, respiratory phasicity and response to augmentation. Calf Veins: No evidence of thrombus. Normal compressibility and flow on color Doppler imaging. Superficial Great Saphenous Vein: No evidence of thrombus. Normal compressibility. Venous Reflux:  None. Other Findings:  None. IMPRESSION: No evidence of deep venous thrombosis in either lower extremity. Electronically Signed   By: Dahlia Bailiff MD   On: 07/24/2020 14:31   DG Chest Port 1 View  Result Date: 08/11/2020 CLINICAL DATA:  Shortness of breath and wheezing EXAM: PORTABLE CHEST 1 VIEW COMPARISON:  July 15, 2020 FINDINGS: There is cardiomegaly with pulmonary venous hypertension. There is airspace opacity in the left lower lobe and to a lesser degree in the left upper lobe. There is also subtle ill-defined opacity  right upper lobe. No adenopathy no bone lesions. IMPRESSION: Cardiomegaly with pulmonary vascular congestion. Airspace opacity in the left lower lobe as well as more subtly in each upper lobe. Suspect multifocal pneumonia. Patchy pulmonary edema could present in this manner. Both edema and pneumonia may present concurrently. Note that atypical organism pneumonia could present in this manner. Check of COVID-19 status may be advised. Electronically Signed   By: Lowella Grip III M.D.   On: 08/06/2020 08:39     EKG: I have personally reviewed.  Sinus rhythm, QTC 428, LAD, PAC, poor R wave progression  Assessment/Plan Principal Problem:   CAP (community acquired pneumonia) Active Problems:   Coronary artery disease   Hypercholesterolemia   Hypertension   Elevated troponin   Sepsis (HCC)   Thrombocytopenia (HCC)   CKD (chronic kidney disease), stage IIIb   Acute respiratory failure with hypoxia (HCC)  Acute respiratory failure with hypoxia and sepsis due to CAP (community acquired pneumonia): Patient failed outpatient antibiotic treatment.  Patient meets criteria for sepsis with fever of 102.9, tachycardia with heart rate up to 111, tachypnea with RR 27.  Lactic acid is 1.6, currently hemodynamically stable.  -Admitted to progressive unit as inpatient - IV Rocephin and doxycycline (patient received 1 dose of vancomycin, cefepime and azithromycin) - Mucinex for cough  - Bronchodilators - Urine legionella and S. pneumococcal antigen - Follow up blood culture x2, sputum culture - will get Procalcitonin and trend lactic acid level per sepsis protocol - IVF: 1L of LR  Coronary artery disease and Elevated troponin: Troponin 791 --> 582, already trending down.  Likely due to demand ischemia.  Patient does not have chest pain. -Plavix, Zocor -Trend troponin -Check A1c, FLP  Hypercholesterolemia -Zocor  Hypertension -Hold amlodipine since patient is at high risk of developing hypotension  due to sepsis -IV hydralazine as needed  Thrombocytopenia ([place]): This is chronic issue, platelet 87, no bleeding tendency -Follow-up with CBC  CKD (chronic kidney disease), stage IIIb: Recent baseline creatinine 1.4-1.7, patient's creatinine is 1.78, BUN 30, slightly worsening than baseline. -IV fluid as above -Follow-up with BMP     DVT ppx: SQ Lovenox Code Status: DNR (pt has DNR form with him) Family Communication:    Yes, patient's daughter at bed side Disposition Plan:  Anticipate discharge back to previous environment Consults called:  none Admission status and Level of care: Progressive Cardiac:  as inpt    Status is: Inpatient  Remains inpatient appropriate because:Inpatient level of care appropriate due to severity of illness   Dispo: The patient is from:  Home              Anticipated d/c is to: Home              Patient currently is not medically stable to d/c.   Difficult to place patient No          Date of Service 08/03/2020    [facility]   If 7PM-7AM, please contact night-coverage [email or web address] 08/02/2020, 6:02 PM

## 2020-07-17 NOTE — Consult Note (Signed)
PHARMACY -  BRIEF ANTIBIOTIC NOTE   Pharmacy has received consult(s) for Vancomycin and Cefepime from an ED provider.  The patient's profile has been reviewed for ht/wt/allergies/indication/available labs.    One time order(s) placed for Vancomycin 1500mg  IV and Cefepime 2g IV x 1 dose each.  Further antibiotics/pharmacy consults should be ordered by admitting physician if indicated.                       Thank you, Pearla Dubonnet 07/18/2020  8:07 AM

## 2020-07-17 NOTE — ED Notes (Signed)
Dtr in Public affairs consultant  Rodena Piety: [phone]

## 2020-07-17 NOTE — ED Notes (Signed)
Pharmacy called at this time for missing meds. Unable to start abx due to lack of supply. Awaiting medications.

## 2020-07-17 NOTE — ED Notes (Signed)
labs recollected and sent to lab at this time.

## 2020-07-17 NOTE — ED Triage Notes (Signed)
Pt to ER via [facility] from home with c/o shortness of breath. Pt was diagnosed with pneumonia of the left lung on Friday 6/3 and has been taking amoxicillin, steroids, and tessalon pearls with no relief in symptoms. On EMS arrival pt audibly wheezing with room air O2 sats at 80%.   Pt given neb by Ems with increased sats ti 100%. 500cc normal saline bolus and 125mg  solumedrol IV.   Sepsis alert called by EMS- RR 24-30, HR 104, NP 199/61.

## 2020-07-17 NOTE — ED Provider Notes (Addendum)
[name] Emergency Department Provider Note  ____________________________________________   Event Date/Time   First MD Initiated Contact with Patient 07/31/2020 [phone]     (approximate)  I have reviewed the triage vital signs and the nursing notes.   HISTORY  Chief Complaint Shortness of Breath   HPI Aaron Burton is a 87 y.o. male with a past medical history of CAD, HTN, HDL, gout and recent diagnosis of pneumonia 3 prescribed Augmentin and azithromycin who presents via EMS from home for assessment of worsening cough shortness of breath and weakness.  Patient notes she is actually had a cough was seemed mild for several months but got acutely worse on the third.  He is not sure if he had a fever but per EMS he was febrile over 101.  He denies any headache, earache, sore throat, chest pain, Donnell pain does endorse some chronic soreness in 6.  Recent falls or injuries.  Any difficulty urinating, rash or extremity pain.  No other acute concerns at this time.  Previously had some wheezing on their exam and so he was given 125 mg of Solu-Medrol and a DuoNeb breathing treatment in route.         Past Medical History:  Diagnosis Date  . Coronary artery disease   . Gout   . Hypercholesterolemia   . Hypertension     Patient Active Problem List   Diagnosis Date Noted  . Angioedema 07/09/2018    Past Surgical History:  Procedure Laterality Date  . ACHILLES TENDON REPAIR    . CAROTID ARTERY ANGIOPLASTY    . COLON SURGERY    . LEG SURGERY    . LITHOTRIPSY      Prior to Admission medications   Medication Sig Start Date End Date Taking? Authorizing Provider  amLODipine (NORVASC) 5 MG tablet Take 1 tablet by mouth daily. 02/18/17   [provider]  amoxicillin-clavulanate (AUGMENTIN) 875-125 MG tablet Take 1 tablet by mouth every 12 (twelve) hours. 07/15/20   Margarette Canada, NP  aspirin EC 81 MG tablet Take 1 tablet by mouth daily.    [provider]  azithromycin (ZITHROMAX Z-PAK) 250 MG tablet Take 1 tablet (250 mg total) by mouth daily. Take 2 tablets on the first day and then 1 tablet daily thereafter for a total of 5 days of treatment. 07/15/20   Margarette Canada, NP  benzonatate (TESSALON) 100 MG capsule Take 2 capsules (200 mg total) by mouth every 8 (eight) hours. 07/15/20   Margarette Canada, NP  Cholecalciferol 125 MCG (5000 UT) TABS Take 5,000 Units by mouth 2 (two) times a week.    [provider]  clopidogrel (PLAVIX) 75 MG tablet Take 1 tablet by mouth daily. 07/12/20   [provider]  diphenhydrAMINE (BENADRYL) 25 mg capsule Take 1 capsule (25 mg total) by mouth 3 (three) times daily for 5 days. 07/10/18 07/15/18  Fritzi Mandes, MD  famotidine (PEPCID) 10 MG tablet Take 1 tablet (10 mg total) by mouth 2 (two) times daily. 07/10/18   Fritzi Mandes, MD  gabapentin (NEURONTIN) 300 MG capsule Take 2 capsules by mouth at bedtime. 07/12/20 07/12/21  [provider]  HYDROcodone-acetaminophen (NORCO/VICODIN) 5-325 MG tablet Take 1 tablet by mouth every 6 (six) hours as needed for severe pain. 01/24/20   Lavonia Drafts, MD  methocarbamol (ROBAXIN) 500 MG tablet Take 1 tablet (500 mg total) by mouth 2 (two) times daily. 03/02/20   Margarette Canada, NP  Multiple Vitamin (MULTI-VITAMINS) TABS  Take 1 tablet by mouth daily.    [provider]  promethazine-dextromethorphan (PROMETHAZINE-DM) 6.25-15 MG/5ML syrup Take 5 mLs by mouth 4 (four) times daily as needed. 07/15/20   Margarette Canada, NP  senna-docusate (SENOKOT-S) 8.6-50 MG tablet Take 0.5 tablets by mouth every evening.    [provider]  simvastatin (ZOCOR) 20 MG tablet Take 20 mg by mouth every evening.     [provider]  triamcinolone cream (KENALOG) 0.1 % Apply 1 application topically 2 (two) times daily. 05/19/18   [provider]  colchicine 0.6 MG tablet Take 1 tablet (0.6 mg total) by mouth daily as needed (gout). 07/10/18 12/26/19   Fritzi Mandes, MD    Allergies Ace inhibitors and Haemophilus influenzae vaccines  Family History  Problem Relation Age of Onset  . Stroke Mother   . Hypertension Father   . Heart disease Father     Social History Social History   Tobacco Use  . Smoking status: Never Smoker  . Smokeless tobacco: Never Used  Vaping Use  . Vaping Use: Never used  Substance Use Topics  . Alcohol use: No  . Drug use: No    Review of Systems  Review of Systems  Constitutional: Negative for chills and fever.  HENT: Negative for sore throat.   Eyes: Negative for pain.  Respiratory: Positive for cough, shortness of breath and wheezing. Negative for stridor.   Cardiovascular: Negative for chest pain.  Gastrointestinal: Negative for vomiting.  Genitourinary: Negative for dysuria.  Musculoskeletal: Negative for myalgias.  Skin: Negative for rash.  Neurological: Negative for seizures, loss of consciousness and headaches.  Psychiatric/Behavioral: Negative for suicidal ideas.  All other systems reviewed and are negative.     ____________________________________________   PHYSICAL EXAM:  VITAL SIGNS: ED Triage Vitals  Enc Vitals Group     BP      Pulse      Resp      Temp      Temp src      SpO2      Weight      Height      Head Circumference      Peak Flow      Pain Score      Pain Loc      Pain Edu?      Excl. in [place]?    Vitals:   07/26/2020 1030 07/24/2020 1100  BP: 125/64 (!) 117/55  Pulse: 92 88  Resp: 20 (!) 21  Temp:    SpO2: 93% 96%   Physical Exam Vitals and nursing note reviewed.  Constitutional:      Appearance: He is well-developed. He is ill-appearing.  HENT:     Head: Normocephalic and atraumatic.     Right Ear: External ear normal.     Left Ear: External ear normal.     Nose: Nose normal.     Mouth/Throat:     Mouth: Mucous membranes are dry.  Eyes:     Conjunctiva/sclera: Conjunctivae normal.  Cardiovascular:     Rate and Rhythm: Regular rhythm.  Tachycardia present.     Heart sounds: No murmur heard.   Pulmonary:     Effort: Pulmonary effort is normal. No respiratory distress.     Breath sounds: Decreased breath sounds, wheezing and rhonchi present.  Abdominal:     Palpations: Abdomen is soft.     Tenderness: There is no abdominal tenderness.  Musculoskeletal:     Cervical back: Neck supple.  Right lower leg: No edema.     Left lower leg: No edema.  Skin:    General: Skin is warm and dry.     Capillary Refill: Capillary refill takes 2 to 3 seconds.  Neurological:     Mental Status: He is alert and oriented to person, place, and time.  Psychiatric:        Mood and Affect: Mood normal.      ____________________________________________   LABS (all labs ordered are listed, but only abnormal results are displayed)  Labs Reviewed  COMPREHENSIVE METABOLIC PANEL - Abnormal; Notable for the following components:      Result Value   Sodium 134 (*)    CO2 21 (*)    Glucose, Bld 116 (*)    BUN 30 (*)    Creatinine, Ser 1.78 (*)    Calcium 8.6 (*)    Albumin 3.2 (*)    AST 113 (*)    GFR, Estimated 36 (*)    All other components within normal limits  CBC WITH DIFFERENTIAL/PLATELET - Abnormal; Notable for the following components:   WBC 11.6 (*)    RBC 3.79 (*)    Hemoglobin 10.9 (*)    HCT 34.6 (*)    RDW 19.0 (*)    Platelets 87 (*)    Monocytes Absolute 2.9 (*)    Abs Immature Granulocytes 0.19 (*)    All other components within normal limits  PROTIME-INR - Abnormal; Notable for the following components:   Prothrombin Time 16.4 (*)    INR 1.3 (*)    All other components within normal limits  APTT - Abnormal; Notable for the following components:   aPTT 38 (*)    All other components within normal limits  URINALYSIS, COMPLETE (UACMP) WITH MICROSCOPIC - Abnormal; Notable for the following components:   Color, Urine YELLOW (*)    APPearance HAZY (*)    Hgb urine dipstick MODERATE (*)    Protein, ur 100 (*)     All other components within normal limits  D-DIMER, QUANTITATIVE - Abnormal; Notable for the following components:   D-Dimer, Quant 4.26 (*)    All other components within normal limits  TROPONIN I (HIGH SENSITIVITY) - Abnormal; Notable for the following components:   Troponin I (High Sensitivity) 791 (*)    All other components within normal limits  RESP PANEL BY RT-PCR (FLU A&B, COVID) ARPGX2  CULTURE, BLOOD (SINGLE)  URINE CULTURE  LACTIC ACID, PLASMA  MAGNESIUM  PROCALCITONIN  LACTIC ACID, PLASMA  BRAIN NATRIURETIC PEPTIDE  TROPONIN I (HIGH SENSITIVITY)   ____________________________________________  EKG  Sinus rhythm with a ventricular rate of 99, multiple nonspecific changes versus artifact in anterior leads and inferior leads. ____________________________________________  RADIOLOGY  ED MD interpretation: Chest x-ray evidence of multifocal pneumonia without pneumothorax large effusion.  Somewhat difficult to exclude some early edema as well.  CTA chest without evidence of PE, aneurysm, dissection there is evidence of aortic atherosclerosis and CAD.  There is also evidence of pneumonia in the left lower lobe and in each upper lobe.  There is also small bilateral pleural effusions.  Official radiology report(s): CT Angio Chest PE W and/or Wo Contrast  Result Date: 07/31/2020 CLINICAL DATA:  Cough and shortness of breath EXAM: CT ANGIOGRAPHY CHEST WITH CONTRAST TECHNIQUE: Multidetector CT imaging of the chest was performed using the standard protocol during bolus administration of intravenous contrast. Multiplanar CT image reconstructions and MIPs were obtained to evaluate the vascular anatomy. CONTRAST:  45mL OMNIPAQUE  IOHEXOL 350 MG/ML SOLN COMPARISON:  CT angiogram chest January 24, 2020; chest radiograph July 17, 2020 FINDINGS: Cardiovascular: There is no demonstrable pulmonary embolus. There is no appreciable thoracic aortic aneurysm or dissection. There are scattered foci  of calcification in visualized great vessels. There are foci of aortic atherosclerosis. There are multiple foci coronary artery calcification. There is no pericardial effusion or pericardial thickening. Mediastinum/Nodes: No thyroid lesion evident. No appreciable thoracic adenopathy by size criteria. No esophageal lesions. Lungs/Pleura: There is a pleural effusion on each side, slightly larger on the left than the right. There are foci of airspace consolidation in the left lower lobe. There is also atelectatic change in each lower lobe and inferior lingular region. In the upper lobes posteriorly, there is patchy infiltrate on each side. Trachea and major bronchial structures appear normal. No evident pneumothorax. Upper Abdomen: Visualized upper abdominal structures appear unremarkable Musculoskeletal: There is degenerative change in the thoracic spine with diffuse idiopathic skeletal hyperostosis. There is degenerative change in each shoulder. No blastic or lytic bone lesions. No appreciable chest wall lesions. Review of the MIP images confirms the above findings. IMPRESSION: 1. No evident pulmonary embolus. No thoracic aortic aneurysm or dissection. There is aortic atherosclerosis as well as foci of great vessel and coronary artery calcification. 2. Airspace opacity consistent with pneumonia in the left lower lobe as well as in each upper lobe, primarily posteriorly. Areas of atelectasis in each lower lobe and inferior lingula noted. 3. Small pleural effusions bilaterally, slightly larger on the left than on the right. 4.  No evident adenopathy. 5.  Diffuse idiopathic skeletal hyperostosis in the thoracic spine. Aortic Atherosclerosis (ICD10-I70.0). Electronically Signed   By: Lowella Grip III M.D.   On: 08/04/2020 11:16   DG Chest Port 1 View  Result Date: 07/28/2020 CLINICAL DATA:  Shortness of breath and wheezing EXAM: PORTABLE CHEST 1 VIEW COMPARISON:  July 15, 2020 FINDINGS: There is cardiomegaly with  pulmonary venous hypertension. There is airspace opacity in the left lower lobe and to a lesser degree in the left upper lobe. There is also subtle ill-defined opacity right upper lobe. No adenopathy no bone lesions. IMPRESSION: Cardiomegaly with pulmonary vascular congestion. Airspace opacity in the left lower lobe as well as more subtly in each upper lobe. Suspect multifocal pneumonia. Patchy pulmonary edema could present in this manner. Both edema and pneumonia may present concurrently. Note that atypical organism pneumonia could present in this manner. Check of COVID-19 status may be advised. Electronically Signed   By: Lowella Grip III M.D.   On: 07/20/2020 08:39    ____________________________________________   PROCEDURES  Procedure(s) performed (including Critical Care):  .Critical Care Performed by: Lucrezia Starch, MD Authorized by: Lucrezia Starch, MD   Critical care provider statement:    Critical care time (minutes):  45   Critical care time was exclusive of:  Separately billable procedures and treating other patients   Critical care was necessary to treat or prevent imminent or life-threatening deterioration of the following conditions:  Respiratory failure, sepsis and cardiac failure   Critical care was time spent personally by me on the following activities:  Discussions with consultants, evaluation of patient's response to treatment, examination of patient, ordering and performing treatments and interventions, ordering and review of laboratory studies, ordering and review of radiographic studies, pulse oximetry, re-evaluation of patient's condition, obtaining history from patient or surrogate and review of old charts     ____________________________________________   INITIAL IMPRESSION / ASSESSMENT AND PLAN /  ED COURSE      Patient presents with above-stated history and exam for assessment of worsening cough and shortness of breath after recently being diagnosed  with pneumonia in the setting of subacute cough.  Per EMS patient had some wheezing and was given Solu-Medrol and duo nebs as well as some fluids in route.  On arrival he is slightly tachycardic, tachypneic and requiring 4 L nasal cannula to maintain his SPO2. 85% on room air improved to 96% on 4L.  Primary differential includes acute bacterial pneumonia, viral bronchitis, heart failure, ACS, arrhythmia, PE, metabolic derangements and anemia.  Chest x-ray is evidence of multifocal pneumonia.  Given fever tachycardia and tachypnea we will treat as possible sepsis with IV fluid broad-spectrum antibiotics and obtaining a blood and urine cultures and lactic acid.  No other obvious source of infection on exam.  This x-ray is concerning for multifocal pneumonia.  COVID and flu is negative.  CBC with WBC count 11.6, hemoglobin of 10.9 and platelets of 87.  Troponin is elevated at 791.  This concerning for an NSTEMI likely in the setting of sepsis.  He has no chest pain Evalose patient for acute coronary thrombosis.  However will treat with ASA and heparin.  Pro-Cal is 0.65.  D-dimer is elevated at 4.26.  Given elevated D-dimer CTA chest obtained.  CTA chest without evidence of PE, aneurysm, dissection there is evidence of aortic atherosclerosis and CAD.  There is also evidence of pneumonia in the left lower lobe and in each upper lobe.  There is also small bilateral pleural effusions.  Given patient with hypoxic respiratory failure and concern for sepsis from pneumonia I will plan to admit to medicine service for further evaluation and management.  ____________________________________________   FINAL CLINICAL IMPRESSION(S) / ED DIAGNOSES  Final diagnoses:  Acute respiratory failure with hypoxia ([place])  Pneumonia due to infectious organism, unspecified laterality, unspecified part of lung  Sepsis, due to unspecified organism, unspecified whether acute organ dysfunction present [facility])  NSTEMI (non-ST  elevated myocardial infarction) ([place])    Medications  vancomycin (VANCOREADY) IVPB 1500 mg/300 mL (has no administration in time range)  aspirin chewable tablet 324 mg (has no administration in time range)  acetaminophen (TYLENOL) tablet 1,000 mg (1,000 mg Oral Given 07/23/2020 0836)  lactated ringers bolus 1,000 mL (0 mLs Intravenous Stopped 07/15/2020 1111)  ceFEPIme (MAXIPIME) 2 g in sodium chloride 0.9 % 100 mL IVPB (0 g Intravenous Stopped 07/29/2020 1110)  iohexol (OMNIPAQUE) 350 MG/ML injection 75 mL (75 mLs Intravenous Contrast Given 07/26/2020 1059)     ED Discharge Orders    None       Note:  This document was prepared using Dragon voice recognition software and may include unintentional dictation errors.   Lucrezia Starch, MD 07/26/2020 1012    Lucrezia Starch, MD 08/05/2020 1124

## 2020-07-17 NOTE — ED Notes (Signed)
Md Tamala Julian at bedside.

## 2020-07-17 NOTE — ED Notes (Signed)
Order placed for IV team. Multiple IV starts attempted by this RN and Musician without success. Md Tamala Julian notified.

## 2020-07-18 DIAGNOSIS — R7989 Other specified abnormal findings of blood chemistry: Secondary | ICD-10-CM

## 2020-07-18 LAB — HEMOGLOBIN A1C
Hgb A1c MFr Bld: 6.2 % — ABNORMAL HIGH (ref 4.8–5.6)
Mean Plasma Glucose: 131 mg/dL

## 2020-07-18 LAB — CBC
HCT: 30.3 % — ABNORMAL LOW (ref 39.0–52.0)
Hemoglobin: 9.9 g/dL — ABNORMAL LOW (ref 13.0–17.0)
MCH: 28.5 pg (ref 26.0–34.0)
MCHC: 32.7 g/dL (ref 30.0–36.0)
MCV: 87.3 fL (ref 80.0–100.0)
Platelets: 97 10*3/uL — ABNORMAL LOW (ref 150–400)
RBC: 3.47 MIL/uL — ABNORMAL LOW (ref 4.22–5.81)
RDW: 18.7 % — ABNORMAL HIGH (ref 11.5–15.5)
WBC: 19.8 10*3/uL — ABNORMAL HIGH (ref 4.0–10.5)
nRBC: 0 % (ref 0.0–0.2)

## 2020-07-18 LAB — URINE CULTURE: Culture: NO GROWTH

## 2020-07-18 LAB — BASIC METABOLIC PANEL
Anion gap: 10 (ref 5–15)
BUN: 42 mg/dL — ABNORMAL HIGH (ref 8–23)
CO2: 21 mmol/L — ABNORMAL LOW (ref 22–32)
Calcium: 8.4 mg/dL — ABNORMAL LOW (ref 8.9–10.3)
Chloride: 103 mmol/L (ref 98–111)
Creatinine, Ser: 1.82 mg/dL — ABNORMAL HIGH (ref 0.61–1.24)
GFR, Estimated: 35 mL/min — ABNORMAL LOW (ref >60)
Glucose, Bld: 149 mg/dL — ABNORMAL HIGH (ref 70–99)
Potassium: 3.9 mmol/L (ref 3.5–5.1)
Sodium: 134 mmol/L — ABNORMAL LOW (ref 135–145)

## 2020-07-18 LAB — LIPID PANEL
Cholesterol: 111 mg/dL (ref 0–200)
HDL: 38 mg/dL — ABNORMAL LOW (ref >40)
LDL Cholesterol: 59 mg/dL (ref 0–99)
Total CHOL/HDL Ratio: 2.9 RATIO
Triglycerides: 68 mg/dL (ref <150)
VLDL: 14 mg/dL (ref 0–40)

## 2020-07-18 LAB — LACTIC ACID, PLASMA: Lactic Acid, Venous: 2.4 mmol/L (ref 0.5–1.9)

## 2020-07-18 MED ORDER — LACTATED RINGERS IV BOLUS
1000.0000 mL | Freq: Once | INTRAVENOUS | Status: AC
  Administered 2020-07-18: 1000 mL via INTRAVENOUS

## 2020-07-18 MED ORDER — GUAIFENESIN ER 600 MG PO TB12
1200.0000 mg | ORAL_TABLET | Freq: Two times a day (BID) | ORAL | Status: DC
  Administered 2020-07-18 – 2020-07-26 (×16): 1200 mg via ORAL
  Filled 2020-07-18 (×19): qty 2

## 2020-07-18 MED ORDER — METHYLPREDNISOLONE SODIUM SUCC 40 MG IJ SOLR
40.0000 mg | Freq: Once | INTRAMUSCULAR | Status: AC
  Administered 2020-07-18: 40 mg via INTRAVENOUS
  Filled 2020-07-18: qty 1

## 2020-07-18 NOTE — Consult Note (Signed)
[facility] Cardiology  CARDIOLOGY CONSULT NOTE  Patient ID: Aaron Burton MRN: [id number] DOB/AGE: 09/08/1928 87 y.o.  Admit date: 07/20/2020 Referring Physician Darrick Meigs Primary Physician [place] Primary Cardiologist Nehemiah Massed Reason for Consultation elevated troponin  HPI: 87 year old gentleman referred for evaluation of elevated troponin.  Patient has a history of cerebrovascular disease, status post carotid endarterectomy, essential hypertension, hyperlipidemia, chronic kidney disease stage IIIb, and thrombocytopenia.  The patient was in his usual state of health till recently, developed fever, chills, cough and shortness of breath.  Presented to [facility] ED 08/01/2020.  ECG revealed sinus rhythm at 99 bpm with nonspecific intraventricular conduction delay.  Chest x-ray revealed airspace opacity left lower lobe and bilateral upper lobes consistent with multifocal pneumonia.  Lab work was notable for elevated D-dimer.  Chest CT did not reveal evidence for pulmonary embolus, but did confirm with lower lobe and bilateral upper lobe infiltrates consistent with multifocal pneumonia.  Admission labs also notable for elevated troponin 791, 585, 563, 457.  Patient denies chest pain.  Review of systems complete and found to be negative unless listed above     Past Medical History:  Diagnosis Date  . Coronary artery disease   . Gout   . Hypercholesterolemia   . Hypertension     Past Surgical History:  Procedure Laterality Date  . ACHILLES TENDON REPAIR    . CAROTID ARTERY ANGIOPLASTY    . COLON SURGERY    . LEG SURGERY    . LITHOTRIPSY      (Not in a hospital admission)  Social History   Socioeconomic History  . Marital status: Widowed    Spouse name: Not on file  . Number of children: Not on file  . Years of education: Not on file  . Highest education level: Not on file  Occupational History  . Not on file  Tobacco Use  . Smoking status: Never Smoker  . Smokeless tobacco: Never Used  Vaping  Use  . Vaping Use: Never used  Substance and Sexual Activity  . Alcohol use: No  . Drug use: No  . Sexual activity: Not on file  Other Topics Concern  . Not on file  Social History Narrative  . Not on file   Social Determinants of Health   Financial Resource Strain: Not on file  Food Insecurity: Not on file  Transportation Needs: Not on file  Physical Activity: Not on file  Stress: Not on file  Social Connections: Not on file  Intimate Partner Violence: Not on file    Family History  Problem Relation Age of Onset  . Stroke Mother   . Hypertension Father   . Heart disease Father       Review of systems complete and found to be negative unless listed above      PHYSICAL EXAM  General: Well developed, well nourished, in no acute distress HEENT:  Normocephalic and atramatic Neck:  No JVD.  Lungs: Clear bilaterally to auscultation and percussion. Heart: HRRR . Normal S1 and S2 without gallops or murmurs.  Abdomen: Bowel sounds are positive, abdomen soft and non-tender  Msk:  Back normal, normal gait. Normal strength and tone for age. Extremities: No clubbing, cyanosis or edema.   Neuro: Alert and oriented X 3. Psych:  Good affect, responds appropriately  Labs:   Lab Results  Component Value Date   WBC 19.8 (H) 07/18/2020   HGB 9.9 (L) 07/18/2020   HCT 30.3 (L) 07/18/2020   MCV 87.3 07/18/2020   PLT 97 (  L) 07/18/2020    Recent Labs  Lab 08/09/2020 0815 07/18/20 0521  NA 134* 134*  K 3.5 3.9  CL 101 103  CO2 21* 21*  BUN 30* 42*  CREATININE 1.78* 1.82*  CALCIUM 8.6* 8.4*  PROT 6.8  --   BILITOT 0.9  --   ALKPHOS 67  --   ALT 33  --   AST 113*  --   GLUCOSE 116* 149*   No results found for: CKTOTAL, CKMB, CKMBINDEX, TROPONINI  Lab Results  Component Value Date   CHOL 111 07/18/2020   Lab Results  Component Value Date   HDL 38 (L) 07/18/2020   Lab Results  Component Value Date   LDLCALC 59 07/18/2020   Lab Results  Component Value Date    TRIG 68 07/18/2020   Lab Results  Component Value Date   CHOLHDL 2.9 07/18/2020   No results found for: LDLDIRECT    Radiology: DG Chest 2 View  Result Date: 07/15/2020 CLINICAL DATA:  Productive cough. EXAM: CHEST - 2 VIEW COMPARISON:  January 24, 2020. FINDINGS: Similar streaky/linear left basilar/retrocardiac opacities. No new consolidation. Previously seen interstitial opacities are improved. Scattered calcified granulomas. No visible pneumothorax or pleural effusion. Biapical pleuroparenchymal scarring. Chronic L2 compression fracture. Calcific atherosclerosis. Similar cardiomediastinal silhouette. IMPRESSION: Similar streaky/linear left basilar/retrocardiac opacities. The stability of this finding favors atelectasis/scar over infection. Electronically Signed   By: Margaretha Sheffield MD   On: 07/15/2020 11:03   CT Angio Chest PE W and/or Wo Contrast  Result Date: 08/07/2020 CLINICAL DATA:  Cough and shortness of breath EXAM: CT ANGIOGRAPHY CHEST WITH CONTRAST TECHNIQUE: Multidetector CT imaging of the chest was performed using the standard protocol during bolus administration of intravenous contrast. Multiplanar CT image reconstructions and MIPs were obtained to evaluate the vascular anatomy. CONTRAST:  74mL OMNIPAQUE IOHEXOL 350 MG/ML SOLN COMPARISON:  CT angiogram chest January 24, 2020; chest radiograph July 17, 2020 FINDINGS: Cardiovascular: There is no demonstrable pulmonary embolus. There is no appreciable thoracic aortic aneurysm or dissection. There are scattered foci of calcification in visualized great vessels. There are foci of aortic atherosclerosis. There are multiple foci coronary artery calcification. There is no pericardial effusion or pericardial thickening. Mediastinum/Nodes: No thyroid lesion evident. No appreciable thoracic adenopathy by size criteria. No esophageal lesions. Lungs/Pleura: There is a pleural effusion on each side, slightly larger on the left than the right. There  are foci of airspace consolidation in the left lower lobe. There is also atelectatic change in each lower lobe and inferior lingular region. In the upper lobes posteriorly, there is patchy infiltrate on each side. Trachea and major bronchial structures appear normal. No evident pneumothorax. Upper Abdomen: Visualized upper abdominal structures appear unremarkable Musculoskeletal: There is degenerative change in the thoracic spine with diffuse idiopathic skeletal hyperostosis. There is degenerative change in each shoulder. No blastic or lytic bone lesions. No appreciable chest wall lesions. Review of the MIP images confirms the above findings. IMPRESSION: 1. No evident pulmonary embolus. No thoracic aortic aneurysm or dissection. There is aortic atherosclerosis as well as foci of great vessel and coronary artery calcification. 2. Airspace opacity consistent with pneumonia in the left lower lobe as well as in each upper lobe, primarily posteriorly. Areas of atelectasis in each lower lobe and inferior lingula noted. 3. Small pleural effusions bilaterally, slightly larger on the left than on the right. 4.  No evident adenopathy. 5.  Diffuse idiopathic skeletal hyperostosis in the thoracic spine. Aortic Atherosclerosis (ICD10-I70.0). Electronically Signed  By: Lowella Grip III M.D.   On: 08/08/2020 11:16   US Venous Img Lower Bilateral (DVT)  Result Date: 08/07/2020 CLINICAL DATA:  Shortness of breath with positive D-dimer, concern for deep vein thrombosis. EXAM: BILATERAL LOWER EXTREMITY VENOUS DOPPLER ULTRASOUND TECHNIQUE: Gray-scale sonography with graded compression, as well as color Doppler and duplex ultrasound were performed to evaluate the lower extremity deep venous systems from the level of the common femoral vein and including the common femoral, femoral, profunda femoral, popliteal and calf veins including the posterior tibial, peroneal and gastrocnemius veins when visible. The superficial great  saphenous vein was also interrogated. Spectral Doppler was utilized to evaluate flow at rest and with distal augmentation maneuvers in the common femoral, femoral and popliteal veins. COMPARISON:  None. FINDINGS: RIGHT LOWER EXTREMITY Common Femoral Vein: No evidence of thrombus. Normal compressibility, respiratory phasicity and response to augmentation. Saphenofemoral Junction: No evidence of thrombus. Normal compressibility and flow on color Doppler imaging. Profunda Femoral Vein: No evidence of thrombus. Normal compressibility and flow on color Doppler imaging. Femoral Vein: No evidence of thrombus. Normal compressibility, respiratory phasicity and response to augmentation. Popliteal Vein: No evidence of thrombus. Normal compressibility, respiratory phasicity and response to augmentation. Calf Veins: No evidence of thrombus. Normal compressibility and flow on color Doppler imaging. Superficial Great Saphenous Vein: No evidence of thrombus. Normal compressibility. Venous Reflux:  None. Other Findings:  None. LEFT LOWER EXTREMITY Common Femoral Vein: No evidence of thrombus. Normal compressibility, respiratory phasicity and response to augmentation. Saphenofemoral Junction: No evidence of thrombus. Normal compressibility and flow on color Doppler imaging. Profunda Femoral Vein: No evidence of thrombus. Normal compressibility and flow on color Doppler imaging. Femoral Vein: No evidence of thrombus. Normal compressibility, respiratory phasicity and response to augmentation. Popliteal Vein: No evidence of thrombus. Normal compressibility, respiratory phasicity and response to augmentation. Calf Veins: No evidence of thrombus. Normal compressibility and flow on color Doppler imaging. Superficial Great Saphenous Vein: No evidence of thrombus. Normal compressibility. Venous Reflux:  None. Other Findings:  None. IMPRESSION: No evidence of deep venous thrombosis in either lower extremity. Electronically Signed   By:  Dahlia Bailiff MD   On: 07/26/2020 14:31   DG Chest Port 1 View  Result Date: 07/25/2020 CLINICAL DATA:  Shortness of breath and wheezing EXAM: PORTABLE CHEST 1 VIEW COMPARISON:  July 15, 2020 FINDINGS: There is cardiomegaly with pulmonary venous hypertension. There is airspace opacity in the left lower lobe and to a lesser degree in the left upper lobe. There is also subtle ill-defined opacity right upper lobe. No adenopathy no bone lesions. IMPRESSION: Cardiomegaly with pulmonary vascular congestion. Airspace opacity in the left lower lobe as well as more subtly in each upper lobe. Suspect multifocal pneumonia. Patchy pulmonary edema could present in this manner. Both edema and pneumonia may present concurrently. Note that atypical organism pneumonia could present in this manner. Check of COVID-19 status may be advised. Electronically Signed   By: Lowella Grip III M.D.   On: 08/07/2020 08:39    EKG: Sinus rhythm 99 bpm with nonspecific intraventricular conduction delay  ASSESSMENT AND PLAN:   1.  Elevated troponin (791 585, 563, 457), in the setting of multifocal pneumonia, in the absence of chest pain, with nondiagnostic ECG, likely due to demand supply ischemia, not due to acute coronary syndrome  ( I21.A1 ) 2.  Multifocal community-acquired pneumonia on broad-spectrum antibiotics 3.  Chronic kidney disease stage IIIb, BUN and creatinine 42 and 1.82, respectively 4.  Thrombocytopenia, platelet count  97  Recommendations  1.  Agree with current therapy 2.  Defer full dose anticoagulation, especially in light of thrombocytopenia 3.  Continue clopidogrel 75 mg daily 4.  Defer cardiac catheterization, especially in patient with elevated troponin likely due to demand supply ischemia, in the absence of chest pain, with advanced age, and chronic kidney disease stage IIIb 5.  Review 2D echocardiogram 6.  Further recommendations pending echocardiogram results  Signed: Isaias Cowman  MD,PhD, [facility] 07/18/2020, 3:31 PM

## 2020-07-18 NOTE — ED Notes (Signed)
Pt assisted to toilet at this time.

## 2020-07-18 NOTE — Progress Notes (Signed)
[facility]  PROGRESS NOTE  Aaron Burton HYI:[id number] DOB: Nov 23, 1928 DOA: 08/02/2020 PCP: Dion Body, MD   Brief HPI:   87 year old male with medical history of hypertension, hyperlipidemia, GERD, gout, CAD, angioedema, CKD stage IIIb, thrombocytopenia presented with cough, shortness of breath, fever and chills.    In the ED, D-dimer was 4.26, CTA chest was negative for PE but showed infiltration in left lower lobe and bilateral upper lobes.  He was febrile with temperature 102.9, heart rate 111, respiratory 27, O2 sats 80% on room air which improved to 93% on 5 L of oxygen.  Patient started on IV Rocephin and doxycycline and admitted to [facility].    Subjective   Patient seen and examined, denies chest pain, still has shortness of breath.  Feels little better than yesterday.  Still requiring 4 L/min of oxygen via nasal cannula.   Assessment/Plan:     Acute hypoxemic respiratory failure -Secondary to community-acquired pneumonia; requiring 4 L/min of oxygen.  Not on oxygen at home. -WBC 19.8, lactic acid 2.4.  Procalcitonin 0.65.  SARS-CoV-2 negative.  Blood cultures x2 are negative to date. -Continue ceftriaxone, doxycycline. -Continue Mucinex, DuoNeb nebulizer every 6 hours   Community-acquired pneumonia -Seen on CTA chest -Procalcitonin 0.65 -Follow urine for Legionella, strep pneumo urinary antigen -Blood cultures are NTD; follow final culture results   Elevated troponin -Troponin was elevated 585> 563> 457; EKG showed poor R wave progression, felt to be demand ischemia by admitting physician -No chest pain -BNP was elevated at 791 ng/l -Concern for some element of pulmonary edema; will obtain 2D echo   CAD -Continue Plavix, Zocor -LDL 59, total cholesterol 111   Chronic thrombocytopenia -Stable, today platelet count is 97,000   CKD stage IIIb -Creatinine stable at 1.82      Scheduled medications:   . cholecalciferol   5,000 Units Oral Once per day on Mon Thu  . clopidogrel  75 mg Oral Daily  . enoxaparin (LOVENOX) injection  30 mg Subcutaneous Q24H  . gabapentin  600 mg Oral QHS  . guaiFENesin  1,200 mg Oral BID  . ipratropium-albuterol  3 mL Nebulization Q6H  . multivitamin with minerals  1 tablet Oral Daily  . simvastatin  20 mg Oral QPM         Data Reviewed:   CBG:  No results for input(s): GLUCAP in the last 168 hours.  SpO2: 96 % O2 Flow Rate (L/min): 4 L/min    Vitals:   07/18/20 0830 07/18/20 0900 07/18/20 0930 07/18/20 1052  BP: 119/60 120/60 132/65   Pulse: 73 80  92  Resp: (!) 27 19 (!) 27 17  Temp:      TempSrc:      SpO2: 94% 91% 94% 96%  Weight:      Height:         Intake/Output Summary (Last 24 hours) at 07/18/2020 1331 Last data filed at 07/18/2020 1324 Gross per 24 hour  Intake 551.46 ml  Output --  Net 551.46 ml    06/04 1901 - 06/06 0700 In: 1400  Out: 400 [Urine:400]  Filed Weights   07/13/2020 0809  Weight: 72.6 kg    CBC:  Recent Labs  Lab 08/07/2020 0815 07/18/20 0521  WBC 11.6* 19.8*  HGB 10.9* 9.9*  HCT 34.6* 30.3*  PLT 87* 97*  MCV 91.3 87.3  MCH 28.8 28.5  MCHC 31.5 32.7  RDW 19.0* 18.7*  LYMPHSABS 1.5  --   MONOABS 2.9*  --  EOSABS 0.0  --   BASOSABS 0.1  --     Complete metabolic panel:  Recent Labs  Lab 07/22/2020 0815 07/21/2020 0851 07/18/20 0521  NA 134*  --  134*  K 3.5  --  3.9  CL 101  --  103  CO2 21*  --  21*  GLUCOSE 116*  --  149*  BUN 30*  --  42*  CREATININE 1.78*  --  1.82*  CALCIUM 8.6*  --  8.4*  AST 113*  --   --   ALT 33  --   --   ALKPHOS 67  --   --   BILITOT 0.9  --   --   ALBUMIN 3.2*  --   --   MG 2.0  --   --   DDIMER 4.26*  --   --   PROCALCITON 0.65  --   --   LATICACIDVEN 1.6  --  2.4*  INR 1.3*  --   --   BNP  --  764.7*  --     No results for input(s): LIPASE, AMYLASE in the last 168 hours.  Recent Labs  Lab 07/15/20 1014 08/10/2020 0815 07/26/2020 0851  DDIMER  --  4.26*  --    BNP  --   --  764.7*  PROCALCITON  --  0.65  --   SARSCOV2NAA NEGATIVE NEGATIVE  --     ------------------------------------------------------------------------------------------------------------------ Recent Labs    07/18/20 0521  CHOL 111  HDL 38*  LDLCALC 59  TRIG 68  CHOLHDL 2.9    No results found for: HGBA1C ------------------------------------------------------------------------------------------------------------------ No results for input(s): TSH, T4TOTAL, T3FREE, THYROIDAB in the last 72 hours.  Invalid input(s): FREET3 ------------------------------------------------------------------------------------------------------------------ No results for input(s): VITAMINB12, FOLATE, FERRITIN, TIBC, IRON, RETICCTPCT in the last 72 hours.  Coagulation profile Recent Labs  Lab 07/26/2020 0815  INR 1.3*   Recent Labs    08/04/2020 0815  DDIMER 4.26*    Cardiac Enzymes No results for input(s): CKTOTAL, CKMB, CKMBINDEX, TROPONINI in the last 168 hours.  ------------------------------------------------------------------------------------------------------------------    Component Value Date/Time   BNP 764.7 (H) 07/18/2020 0851     Antibiotics: Anti-infectives (From admission, onward)   Start     Dose/Rate Route Frequency Ordered Stop   07/18/20 1000  doxycycline (VIBRAMYCIN) 100 mg in sodium chloride 0.9 % 250 mL IVPB        100 mg 125 mL/hr over 120 Minutes Intravenous Every 12 hours 08/09/2020 1805     08/08/2020 2000  cefTRIAXone (ROCEPHIN) 1 g in sodium chloride 0.9 % 100 mL IVPB        1 g 200 mL/hr over 30 Minutes Intravenous Every 24 hours 07/26/2020 1252     07/30/2020 1815  doxycycline (VIBRAMYCIN) 200 mg in dextrose 5 % 250 mL IVPB  Status:  Discontinued        200 mg 125 mL/hr over 120 Minutes Intravenous Every 12 hours 08/08/2020 1804 07/15/2020 1805   07/21/2020 1300  azithromycin (ZITHROMAX) 500 mg in sodium chloride 0.9 % 250 mL IVPB  Status:  Discontinued         500 mg 250 mL/hr over 60 Minutes Intravenous Every 24 hours 07/16/2020 1252 07/28/2020 1804   07/19/2020 0815  vancomycin (VANCOREADY) IVPB 1500 mg/300 mL        1,500 mg 150 mL/hr over 120 Minutes Intravenous  Once 07/22/2020 0807 07/24/2020 1457   07/13/2020 0815  ceFEPIme (MAXIPIME) 2 g in sodium chloride 0.9 % 100 mL IVPB  2 g 200 mL/hr over 30 Minutes Intravenous  Once 07/25/2020 3536 08/05/2020 1110       Radiology Reports  CT Angio Chest PE W and/or Wo Contrast  Result Date: 08/09/2020 CLINICAL DATA:  Cough and shortness of breath EXAM: CT ANGIOGRAPHY CHEST WITH CONTRAST TECHNIQUE: Multidetector CT imaging of the chest was performed using the standard protocol during bolus administration of intravenous contrast. Multiplanar CT image reconstructions and MIPs were obtained to evaluate the vascular anatomy. CONTRAST:  19mL OMNIPAQUE IOHEXOL 350 MG/ML SOLN COMPARISON:  CT angiogram chest January 24, 2020; chest radiograph July 17, 2020 FINDINGS: Cardiovascular: There is no demonstrable pulmonary embolus. There is no appreciable thoracic aortic aneurysm or dissection. There are scattered foci of calcification in visualized great vessels. There are foci of aortic atherosclerosis. There are multiple foci coronary artery calcification. There is no pericardial effusion or pericardial thickening. Mediastinum/Nodes: No thyroid lesion evident. No appreciable thoracic adenopathy by size criteria. No esophageal lesions. Lungs/Pleura: There is a pleural effusion on each side, slightly larger on the left than the right. There are foci of airspace consolidation in the left lower lobe. There is also atelectatic change in each lower lobe and inferior lingular region. In the upper lobes posteriorly, there is patchy infiltrate on each side. Trachea and major bronchial structures appear normal. No evident pneumothorax. Upper Abdomen: Visualized upper abdominal structures appear unremarkable Musculoskeletal: There is  degenerative change in the thoracic spine with diffuse idiopathic skeletal hyperostosis. There is degenerative change in each shoulder. No blastic or lytic bone lesions. No appreciable chest wall lesions. Review of the MIP images confirms the above findings. IMPRESSION: 1. No evident pulmonary embolus. No thoracic aortic aneurysm or dissection. There is aortic atherosclerosis as well as foci of great vessel and coronary artery calcification. 2. Airspace opacity consistent with pneumonia in the left lower lobe as well as in each upper lobe, primarily posteriorly. Areas of atelectasis in each lower lobe and inferior lingula noted. 3. Small pleural effusions bilaterally, slightly larger on the left than on the right. 4.  No evident adenopathy. 5.  Diffuse idiopathic skeletal hyperostosis in the thoracic spine. Aortic Atherosclerosis (ICD10-I70.0). Electronically Signed   By: Lowella Grip III M.D.   On: 07/31/2020 11:16   US Venous Img Lower Bilateral (DVT)  Result Date: 08/04/2020 CLINICAL DATA:  Shortness of breath with positive D-dimer, concern for deep vein thrombosis. EXAM: BILATERAL LOWER EXTREMITY VENOUS DOPPLER ULTRASOUND TECHNIQUE: Gray-scale sonography with graded compression, as well as color Doppler and duplex ultrasound were performed to evaluate the lower extremity deep venous systems from the level of the common femoral vein and including the common femoral, femoral, profunda femoral, popliteal and calf veins including the posterior tibial, peroneal and gastrocnemius veins when visible. The superficial great saphenous vein was also interrogated. Spectral Doppler was utilized to evaluate flow at rest and with distal augmentation maneuvers in the common femoral, femoral and popliteal veins. COMPARISON:  None. FINDINGS: RIGHT LOWER EXTREMITY Common Femoral Vein: No evidence of thrombus. Normal compressibility, respiratory phasicity and response to augmentation. Saphenofemoral Junction: No evidence of  thrombus. Normal compressibility and flow on color Doppler imaging. Profunda Femoral Vein: No evidence of thrombus. Normal compressibility and flow on color Doppler imaging. Femoral Vein: No evidence of thrombus. Normal compressibility, respiratory phasicity and response to augmentation. Popliteal Vein: No evidence of thrombus. Normal compressibility, respiratory phasicity and response to augmentation. Calf Veins: No evidence of thrombus. Normal compressibility and flow on color Doppler imaging. Superficial Great Saphenous Vein: No evidence  of thrombus. Normal compressibility. Venous Reflux:  None. Other Findings:  None. LEFT LOWER EXTREMITY Common Femoral Vein: No evidence of thrombus. Normal compressibility, respiratory phasicity and response to augmentation. Saphenofemoral Junction: No evidence of thrombus. Normal compressibility and flow on color Doppler imaging. Profunda Femoral Vein: No evidence of thrombus. Normal compressibility and flow on color Doppler imaging. Femoral Vein: No evidence of thrombus. Normal compressibility, respiratory phasicity and response to augmentation. Popliteal Vein: No evidence of thrombus. Normal compressibility, respiratory phasicity and response to augmentation. Calf Veins: No evidence of thrombus. Normal compressibility and flow on color Doppler imaging. Superficial Great Saphenous Vein: No evidence of thrombus. Normal compressibility. Venous Reflux:  None. Other Findings:  None. IMPRESSION: No evidence of deep venous thrombosis in either lower extremity. Electronically Signed   By: Dahlia Bailiff MD   On: 07/15/2020 14:31   DG Chest Port 1 View  Result Date: 08/04/2020 CLINICAL DATA:  Shortness of breath and wheezing EXAM: PORTABLE CHEST 1 VIEW COMPARISON:  July 15, 2020 FINDINGS: There is cardiomegaly with pulmonary venous hypertension. There is airspace opacity in the left lower lobe and to a lesser degree in the left upper lobe. There is also subtle ill-defined opacity  right upper lobe. No adenopathy no bone lesions. IMPRESSION: Cardiomegaly with pulmonary vascular congestion. Airspace opacity in the left lower lobe as well as more subtly in each upper lobe. Suspect multifocal pneumonia. Patchy pulmonary edema could present in this manner. Both edema and pneumonia may present concurrently. Note that atypical organism pneumonia could present in this manner. Check of COVID-19 status may be advised. Electronically Signed   By: Lowella Grip III M.D.   On: 07/22/2020 08:39      DVT prophylaxis: Lovenox  Code Status: DNR  Family Communication: No family at bedside   Consultants:    Procedures:      Objective    Physical Examination:   General-appears in no acute distress Heart-S1-S2, regular, no murmur auscultated Lungs-bilateral rhonchi auscultated Abdomen-soft, nontender, no organomegaly Extremities-no edema in the lower extremities Neuro-alert, oriented x3, no focal deficit noted  Status is: Inpatient  Dispo: The patient is from: Home              Anticipated d/c is to: Home              Anticipated d/c date is: 07/20/2020              Patient currently not stable for discharge  Barrier to discharge-ongoing treatment for pneumonia  COVID-19 Labs  Recent Labs    07/24/2020 0815  DDIMER 4.26*    Lab Results  Component Value Date   [place] NEGATIVE 07/16/2020   [place] NEGATIVE 07/15/2020   [place] NEGATIVE 01/24/2020   [place] NEGATIVE 07/09/2018    Microbiology  Recent Results (from the past 240 hour(s))  Resp Panel by RT-PCR (Flu A&B, Covid) Nasopharyngeal Swab     Status: None   Collection Time: 07/15/20 10:14 AM   Specimen: Nasopharyngeal Swab; Nasopharyngeal(NP) swabs in vial transport medium  Result Value Ref Range Status   SARS Coronavirus 2 by RT PCR NEGATIVE NEGATIVE Final    Comment: (NOTE) SARS-CoV-2 target nucleic acids are NOT DETECTED.  The SARS-CoV-2 RNA is generally detectable in  upper respiratory specimens during the acute phase of infection. The lowest concentration of SARS-CoV-2 viral copies this assay can detect is 138 copies/mL. A negative result does not preclude SARS-Cov-2 infection and should not be used as the sole basis for treatment or other  patient management decisions. A negative result may occur with  improper specimen collection/handling, submission of specimen other than nasopharyngeal swab, presence of viral mutation(s) within the areas targeted by this assay, and inadequate number of viral copies(<138 copies/mL). A negative result must be combined with clinical observations, patient history, and epidemiological information. The expected result is Negative.  Fact Sheet for Patients:  EntrepreneurPulse.com.au  Fact Sheet for Healthcare Providers:  IncredibleEmployment.be  This test is no t yet approved or cleared by the [place] FDA and  has been authorized for detection and/or diagnosis of SARS-CoV-2 by FDA under an Emergency Use Authorization (EUA). This EUA will remain  in effect (meaning this test can be used) for the duration of the COVID-19 declaration under Section 564(b)(1) of the Act, 21 U.S.C.section 360bbb-3(b)(1), unless the authorization is terminated  or revoked sooner.       Influenza A by PCR NEGATIVE NEGATIVE Final   Influenza B by PCR NEGATIVE NEGATIVE Final    Comment: (NOTE) The Xpert Xpress SARS-CoV-2/FLU/RSV plus assay is intended as an aid in the diagnosis of influenza from Nasopharyngeal swab specimens and should not be used as a sole basis for treatment. Nasal washings and aspirates are unacceptable for Xpert Xpress SARS-CoV-2/FLU/RSV testing.  Fact Sheet for Patients: EntrepreneurPulse.com.au  Fact Sheet for Healthcare Providers: IncredibleEmployment.be  This test is not yet approved or cleared by the [place] FDA and has been  authorized for detection and/or diagnosis of SARS-CoV-2 by FDA under an Emergency Use Authorization (EUA). This EUA will remain in effect (meaning this test can be used) for the duration of the COVID-19 declaration under Section 564(b)(1) of the Act, 21 U.S.C. section 360bbb-3(b)(1), unless the authorization is terminated or revoked.  Performed at [facility], [address], [place], [place] [address]   Blood culture (routine single)     Status: None (Preliminary result)   Collection Time: 07/16/2020  8:15 AM   Specimen: BLOOD  Result Value Ref Range Status   Specimen Description BLOOD RIGHT ANTECUBITAL  Final   Special Requests   Final    BOTTLES DRAWN AEROBIC AND ANAEROBIC Blood Culture adequate volume   Culture   Final    NO GROWTH 1 DAY Performed at [facility], [address], [place], [place] [address]    Report Status PENDING  Incomplete  Urine culture     Status: None   Collection Time: 07/21/2020  8:15 AM   Specimen: In/Out Cath Urine  Result Value Ref Range Status   Specimen Description   Final    IN/OUT CATH URINE Performed at [facility], [address] [place], [place] [address]    Special Requests   Final    NONE Performed at [facility], [address], [place], [place] [address]    Culture   Final    NO GROWTH Performed at [facility], [place] [address], [place], [place] [address]    Report Status 07/18/2020 FINAL  Final  Resp Panel by RT-PCR (Flu A&B, Covid)     Status: None   Collection Time: 07/18/2020  8:15 AM   Specimen: Nasopharyngeal(NP) swabs in vial transport medium  Result Value Ref Range Status   SARS Coronavirus 2 by RT PCR NEGATIVE NEGATIVE Final    Comment: (NOTE) SARS-CoV-2 target nucleic acids are NOT DETECTED.  The SARS-CoV-2 RNA is generally detectable in upper respiratory specimens during the acute phase of infection. The lowest concentration of SARS-CoV-2 viral copies this assay  can  detect is 138 copies/mL. A negative result does not preclude SARS-Cov-2 infection and should not be used as the sole basis for treatment or other patient management decisions. A negative result may occur with  improper specimen collection/handling, submission of specimen other than nasopharyngeal swab, presence of viral mutation(s) within the areas targeted by this assay, and inadequate number of viral copies(<138 copies/mL). A negative result must be combined with clinical observations, patient history, and epidemiological information. The expected result is Negative.  Fact Sheet for Patients:  EntrepreneurPulse.com.au  Fact Sheet for Healthcare Providers:  IncredibleEmployment.be  This test is no t yet approved or cleared by the [place] FDA and  has been authorized for detection and/or diagnosis of SARS-CoV-2 by FDA under an Emergency Use Authorization (EUA). This EUA will remain  in effect (meaning this test can be used) for the duration of the COVID-19 declaration under Section 564(b)(1) of the Act, 21 U.S.C.section 360bbb-3(b)(1), unless the authorization is terminated  or revoked sooner.       Influenza A by PCR NEGATIVE NEGATIVE Final   Influenza B by PCR NEGATIVE NEGATIVE Final    Comment: (NOTE) The Xpert Xpress SARS-CoV-2/FLU/RSV plus assay is intended as an aid in the diagnosis of influenza from Nasopharyngeal swab specimens and should not be used as a sole basis for treatment. Nasal washings and aspirates are unacceptable for Xpert Xpress SARS-CoV-2/FLU/RSV testing.  Fact Sheet for Patients: EntrepreneurPulse.com.au  Fact Sheet for Healthcare Providers: IncredibleEmployment.be  This test is not yet approved or cleared by the [place] FDA and has been authorized for detection and/or diagnosis of SARS-CoV-2 by FDA under an Emergency Use Authorization (EUA). This EUA will remain in  effect (meaning this test can be used) for the duration of the COVID-19 declaration under Section 564(b)(1) of the Act, 21 U.S.C. section 360bbb-3(b)(1), unless the authorization is terminated or revoked.  Performed at [facility], [address], [place], [place] [address]          [facility] If 7PM-7AM, please contact night-coverage at [email or web address], Office  [phone]   07/18/2020, 1:31 PM  LOS: 1 day

## 2020-07-19 ENCOUNTER — Inpatient Hospital Stay
Admit: 2020-07-19 | Discharge: 2020-07-19 | Disposition: A | Discharge status: Home / Self Care | Payer: PPO | Attending: Family Medicine | Admitting: Family Medicine

## 2020-07-19 ENCOUNTER — Encounter: Payer: Self-pay | Admitting: Internal Medicine

## 2020-07-19 LAB — BASIC METABOLIC PANEL
Anion gap: 8 (ref 5–15)
BUN: 48 mg/dL — ABNORMAL HIGH (ref 8–23)
CO2: 21 mmol/L — ABNORMAL LOW (ref 22–32)
Calcium: 8.9 mg/dL (ref 8.9–10.3)
Chloride: 105 mmol/L (ref 98–111)
Creatinine, Ser: 1.84 mg/dL — ABNORMAL HIGH (ref 0.61–1.24)
GFR, Estimated: 35 mL/min — ABNORMAL LOW (ref >60)
Glucose, Bld: 107 mg/dL — ABNORMAL HIGH (ref 70–99)
Potassium: 4.1 mmol/L (ref 3.5–5.1)
Sodium: 134 mmol/L — ABNORMAL LOW (ref 135–145)

## 2020-07-19 LAB — ECHOCARDIOGRAM COMPLETE
AR max vel: 2.94 cm2
AV Area VTI: 2.92 cm2
AV Area mean vel: 2.68 cm2
AV Mean grad: 6.7 mmHg
AV Peak grad: 12.3 mmHg
Ao pk vel: 1.75 m/s
Area-P 1/2: 3.89 cm2
Height: 66 in
S' Lateral: 2.43 cm
Weight: 2560.02 oz

## 2020-07-19 LAB — CBC
HCT: 29.8 % — ABNORMAL LOW (ref 39.0–52.0)
Hemoglobin: 9.7 g/dL — ABNORMAL LOW (ref 13.0–17.0)
MCH: 28.4 pg (ref 26.0–34.0)
MCHC: 32.6 g/dL (ref 30.0–36.0)
MCV: 87.4 fL (ref 80.0–100.0)
Platelets: 90 10*3/uL — ABNORMAL LOW (ref 150–400)
RBC: 3.41 MIL/uL — ABNORMAL LOW (ref 4.22–5.81)
RDW: 19 % — ABNORMAL HIGH (ref 11.5–15.5)
WBC: 19.7 10*3/uL — ABNORMAL HIGH (ref 4.0–10.5)
nRBC: 0 % (ref 0.0–0.2)

## 2020-07-19 MED ORDER — IPRATROPIUM-ALBUTEROL 0.5-2.5 (3) MG/3ML IN SOLN
3.0000 mL | Freq: Three times a day (TID) | RESPIRATORY_TRACT | Status: DC
  Administered 2020-07-19 – 2020-07-23 (×12): 3 mL via RESPIRATORY_TRACT
  Filled 2020-07-19 (×12): qty 3

## 2020-07-19 MED ORDER — BENZONATATE 100 MG PO CAPS
200.0000 mg | ORAL_CAPSULE | Freq: Three times a day (TID) | ORAL | Status: AC | PRN
  Administered 2020-07-19 – 2020-07-22 (×3): 200 mg via ORAL
  Filled 2020-07-19 (×3): qty 2

## 2020-07-19 MED ORDER — APIXABAN 2.5 MG PO TABS
2.5000 mg | ORAL_TABLET | Freq: Two times a day (BID) | ORAL | Status: DC
  Administered 2020-07-19 – 2020-07-26 (×14): 2.5 mg via ORAL
  Filled 2020-07-19 (×14): qty 1

## 2020-07-19 MED ORDER — METHYLPREDNISOLONE SODIUM SUCC 40 MG IJ SOLR
40.0000 mg | Freq: Two times a day (BID) | INTRAMUSCULAR | Status: DC
  Administered 2020-07-19 – 2020-07-21 (×6): 40 mg via INTRAVENOUS
  Filled 2020-07-19 (×7): qty 1

## 2020-07-19 MED ORDER — SODIUM CHLORIDE 0.9 % IV SOLN
INTRAVENOUS | Status: DC | PRN
  Administered 2020-07-19: 250 mL via INTRAVENOUS

## 2020-07-19 NOTE — Progress Notes (Addendum)
[facility]    SUBJECTIVE: The patient reports that he feels "a little stronger" than yesterday. He denies chest pain. He reports that he continues to cough. He denies shortness of breath at rest on supplemental oxygen.   Vitals:   07/18/20 1744 07/18/20 2036 07/19/20 0344 07/19/20 0747  BP: (!) 118/51 (!) 147/72 100/63 112/62  Pulse: 86 82 63 69  Resp: 17 20 20 20   Temp:  98.6 F (37 C)  97.7 F (36.5 C)  TempSrc:  Oral  Oral  SpO2: 96% 98% 98% 98%  Weight:      Height:         Intake/Output Summary (Last 24 hours) at 07/19/2020 0836 Last data filed at 07/19/2020 0437 Gross per 24 hour  Intake 701.46 ml  Output --  Net 701.46 ml      PHYSICAL EXAM  General: Well developed, well nourished, lying in bed in no acute distress HEENT:  Normocephalic and atramatic Neck:  No JVD.  Lungs: normal effort of breathing on supplemental oxygen, coarse breath sounds, expiratory wheezing Heart: irregularly irregular without gallops or murmurs.  Abdomen: no obvious distention Msk:  Normal strength and tone for age. Extremities: No clubbing, cyanosis or edema.   Neuro: Alert and oriented X 3. Psych:  Good affect, responds appropriately   LABS: Basic Metabolic Panel: Recent Labs    07/25/2020 0815 07/18/20 0521 07/19/20 0618  NA 134* 134* 134*  K 3.5 3.9 4.1  CL 101 103 105  CO2 21* 21* 21*  GLUCOSE 116* 149* 107*  BUN 30* 42* 48*  CREATININE 1.78* 1.82* 1.84*  CALCIUM 8.6* 8.4* 8.9  MG 2.0  --   --    Liver Function Tests: Recent Labs    08/02/2020 0815  AST 113*  ALT 33  ALKPHOS 67  BILITOT 0.9  PROT 6.8  ALBUMIN 3.2*   No results for input(s): LIPASE, AMYLASE in the last 72 hours. CBC: Recent Labs    07/28/2020 0815 07/18/20 0521 07/19/20 0618  WBC 11.6* 19.8* 19.7*  NEUTROABS 6.9  --   --   HGB 10.9* 9.9* 9.7*  HCT 34.6* 30.3* 29.8*  MCV 91.3 87.3 87.4  PLT 87* 97* 90*   Cardiac Enzymes: No results for input(s): CKTOTAL, CKMB, CKMBINDEX, TROPONINI in  the last 72 hours. BNP: Invalid input(s): POCBNP D-Dimer: Recent Labs    07/18/2020 0815  DDIMER 4.26*   Hemoglobin A1C: Recent Labs    07/18/20 0521  HGBA1C 6.2*   Fasting Lipid Panel: Recent Labs    07/18/20 0521  CHOL 111  HDL 38*  LDLCALC 59  TRIG 68  CHOLHDL 2.9   Thyroid Function Tests: No results for input(s): TSH, T4TOTAL, T3FREE, THYROIDAB in the last 72 hours.  Invalid input(s): FREET3 Anemia Panel: No results for input(s): VITAMINB12, FOLATE, FERRITIN, TIBC, IRON, RETICCTPCT in the last 72 hours.  CT Angio Chest PE W and/or Wo Contrast  Result Date: 08/01/2020 CLINICAL DATA:  Cough and shortness of breath EXAM: CT ANGIOGRAPHY CHEST WITH CONTRAST TECHNIQUE: Multidetector CT imaging of the chest was performed using the standard protocol during bolus administration of intravenous contrast. Multiplanar CT image reconstructions and MIPs were obtained to evaluate the vascular anatomy. CONTRAST:  53mL OMNIPAQUE IOHEXOL 350 MG/ML SOLN COMPARISON:  CT angiogram chest January 24, 2020; chest radiograph July 17, 2020 FINDINGS: Cardiovascular: There is no demonstrable pulmonary embolus. There is no appreciable thoracic aortic aneurysm or dissection. There are scattered foci of calcification in visualized great vessels. There are  foci of aortic atherosclerosis. There are multiple foci coronary artery calcification. There is no pericardial effusion or pericardial thickening. Mediastinum/Nodes: No thyroid lesion evident. No appreciable thoracic adenopathy by size criteria. No esophageal lesions. Lungs/Pleura: There is a pleural effusion on each side, slightly larger on the left than the right. There are foci of airspace consolidation in the left lower lobe. There is also atelectatic change in each lower lobe and inferior lingular region. In the upper lobes posteriorly, there is patchy infiltrate on each side. Trachea and major bronchial structures appear normal. No evident pneumothorax.  Upper Abdomen: Visualized upper abdominal structures appear unremarkable Musculoskeletal: There is degenerative change in the thoracic spine with diffuse idiopathic skeletal hyperostosis. There is degenerative change in each shoulder. No blastic or lytic bone lesions. No appreciable chest wall lesions. Review of the MIP images confirms the above findings. IMPRESSION: 1. No evident pulmonary embolus. No thoracic aortic aneurysm or dissection. There is aortic atherosclerosis as well as foci of great vessel and coronary artery calcification. 2. Airspace opacity consistent with pneumonia in the left lower lobe as well as in each upper lobe, primarily posteriorly. Areas of atelectasis in each lower lobe and inferior lingula noted. 3. Small pleural effusions bilaterally, slightly larger on the left than on the right. 4.  No evident adenopathy. 5.  Diffuse idiopathic skeletal hyperostosis in the thoracic spine. Aortic Atherosclerosis (ICD10-I70.0). Electronically Signed   By: Lowella Grip III M.D.   On: 07/26/2020 11:16   US Venous Img Lower Bilateral (DVT)  Result Date: 08/11/2020 CLINICAL DATA:  Shortness of breath with positive D-dimer, concern for deep vein thrombosis. EXAM: BILATERAL LOWER EXTREMITY VENOUS DOPPLER ULTRASOUND TECHNIQUE: Gray-scale sonography with graded compression, as well as color Doppler and duplex ultrasound were performed to evaluate the lower extremity deep venous systems from the level of the common femoral vein and including the common femoral, femoral, profunda femoral, popliteal and calf veins including the posterior tibial, peroneal and gastrocnemius veins when visible. The superficial great saphenous vein was also interrogated. Spectral Doppler was utilized to evaluate flow at rest and with distal augmentation maneuvers in the common femoral, femoral and popliteal veins. COMPARISON:  None. FINDINGS: RIGHT LOWER EXTREMITY Common Femoral Vein: No evidence of thrombus. Normal  compressibility, respiratory phasicity and response to augmentation. Saphenofemoral Junction: No evidence of thrombus. Normal compressibility and flow on color Doppler imaging. Profunda Femoral Vein: No evidence of thrombus. Normal compressibility and flow on color Doppler imaging. Femoral Vein: No evidence of thrombus. Normal compressibility, respiratory phasicity and response to augmentation. Popliteal Vein: No evidence of thrombus. Normal compressibility, respiratory phasicity and response to augmentation. Calf Veins: No evidence of thrombus. Normal compressibility and flow on color Doppler imaging. Superficial Great Saphenous Vein: No evidence of thrombus. Normal compressibility. Venous Reflux:  None. Other Findings:  None. LEFT LOWER EXTREMITY Common Femoral Vein: No evidence of thrombus. Normal compressibility, respiratory phasicity and response to augmentation. Saphenofemoral Junction: No evidence of thrombus. Normal compressibility and flow on color Doppler imaging. Profunda Femoral Vein: No evidence of thrombus. Normal compressibility and flow on color Doppler imaging. Femoral Vein: No evidence of thrombus. Normal compressibility, respiratory phasicity and response to augmentation. Popliteal Vein: No evidence of thrombus. Normal compressibility, respiratory phasicity and response to augmentation. Calf Veins: No evidence of thrombus. Normal compressibility and flow on color Doppler imaging. Superficial Great Saphenous Vein: No evidence of thrombus. Normal compressibility. Venous Reflux:  None. Other Findings:  None. IMPRESSION: No evidence of deep venous thrombosis in either lower extremity. Electronically  Signed   By: Dahlia Bailiff MD   On: 07/29/2020 14:31     Echo Pending  TELEMETRY: irregular, narrow complex rhythm, rates 79-81 bpm  ASSESSMENT AND PLAN:  Principal Problem:   CAP (community acquired pneumonia) Active Problems:   Coronary artery disease   Hypercholesterolemia    Hypertension   Elevated troponin   Sepsis (HCC)   Thrombocytopenia (HCC)   CKD (chronic kidney disease), stage IIIb   Acute respiratory failure with hypoxia ([place])   1.  Elevated troponin (791 585, 563, 457), in the setting of multifocal pneumonia, in the absence of chest pain, with nondiagnostic ECG, likely due to demand supply ischemia, not due to acute coronary syndrome  ( I21.A1 ) 2.  Multifocal community-acquired pneumonia on broad-spectrum antibiotics 3.  Chronic kidney disease stage IIIb, BUN and creatinine 48 and 1.84, respectively 4.  Thrombocytopenia, platelet count 90 5.  Retinal artery occlusion in 04/2020, switched to Plavix, continued statin, carotid ultrasound with mild to moderate plaque 6. Paroxysmal atrial fibrillation, noted on telemetry this morning, chads vasc score of 6    Recommendations: 1. Review 2D echocardiogram 2. Discontinue Plavix, start Eliquis 2.5 mg BID (age >62, creatinine >1.5) 3. Defer cardiac catheterization, especially in patient with elevated troponin likely due to demand supply ischemia, in the absence of chest pain, with advanced age, and chronic kidney disease stage IIIb 4. Continue to monitor on telemetry 5. Management of pneumonia per hospitalist  Clabe Seal, PA-C 07/19/2020 8:36 AM

## 2020-07-19 NOTE — Progress Notes (Signed)
[facility]  PROGRESS NOTE  Aaron Burton NFA:[id number] DOB: Feb 18, 1928 DOA: 07/20/2020 PCP: Dion Body, MD   Brief HPI:   87 year old male with medical history of hypertension, hyperlipidemia, GERD, gout, CAD, angioedema, CKD stage IIIb, thrombocytopenia presented with cough, shortness of breath, fever and chills.    In the ED, D-dimer was 4.26, CTA chest was negative for PE but showed infiltration in left lower lobe and bilateral upper lobes.  He was febrile with temperature 102.9, heart rate 111, respiratory 27, O2 sats 80% on room air which improved to 93% on 5 L of oxygen.  Patient started on IV Rocephin and doxycycline and admitted to [facility].    Subjective   Patient seen and examined, breathing has improved since yesterday.   Assessment/Plan:     Acute hypoxemic respiratory failure -Secondary to community-acquired pneumonia; now requiring 2 L/min oxygen, was on 4 L/min yesterday.  Not on oxygen at home. -WBC 19.8, lactic acid 2.4.  Procalcitonin 0.65.  SARS-CoV-2 negative.  Blood cultures x2 are negative to date. -Continue ceftriaxone, doxycycline. -Continue Mucinex, DuoNeb nebulizer every 6 hours -We will add Solu-Medrol 40 mg IV every 12 hour   Community-acquired pneumonia -Seen on CTA chest -Procalcitonin 0.65 -Follow urine for Legionella, strep pneumo urinary antigen -Blood cultures are NTD; follow final culture results   Elevated troponin -Troponin was elevated 585> 563> 457; EKG showed poor R wave progression, felt to be demand ischemia by admitting physician -No chest pain -BNP was elevated at 791 ng/l -Concern for some element of pulmonary edema; will obtain 2D echo -Cardiology was consulted; no further work-up recommended at this time   Elevated D-dimer -D-dimer was elevated 4.26 -CT chest was negative for PE -We will obtain venous duplex of lower extremities to rule out DVT   CAD -Continue Plavix, Zocor -LDL 59, total  cholesterol 111   Chronic thrombocytopenia -Stable, today platelet count is 90,000   CKD stage IIIb -Creatinine stable at 1.82      Scheduled medications:   . apixaban  2.5 mg Oral BID  . cholecalciferol  5,000 Units Oral Once per day on Mon Thu  . gabapentin  600 mg Oral QHS  . guaiFENesin  1,200 mg Oral BID  . ipratropium-albuterol  3 mL Nebulization Q6H  . methylPREDNISolone (SOLU-MEDROL) injection  40 mg Intravenous Q12H  . multivitamin with minerals  1 tablet Oral Daily  . simvastatin  20 mg Oral QPM         Data Reviewed:   CBG:  No results for input(s): GLUCAP in the last 168 hours.  SpO2: 98 % O2 Flow Rate (L/min): 2 L/min    Vitals:   07/18/20 2036 07/19/20 0344 07/19/20 0747 07/19/20 1217  BP: (!) 147/72 100/63 112/62 135/67  Pulse: 82 63 69 99  Resp: 20 20 20    Temp: 98.6 F (37 C)  97.7 F (36.5 C) 98 F (36.7 C)  TempSrc: Oral  Oral Oral  SpO2: 98% 98% 98% 98%  Weight:      Height:         Intake/Output Summary (Last 24 hours) at 07/19/2020 1557 Last data filed at 07/19/2020 1039 Gross per 24 hour  Intake 1033.82 ml  Output --  Net 1033.82 ml    06/05 1901 - 06/07 0700 In: 701.5  Out: -   Filed Weights   07/30/2020 0809  Weight: 72.6 kg    CBC:  Recent Labs  Lab 07/18/2020 0815 07/18/20 0521 07/19/20 0618  WBC  11.6* 19.8* 19.7*  HGB 10.9* 9.9* 9.7*  HCT 34.6* 30.3* 29.8*  PLT 87* 97* 90*  MCV 91.3 87.3 87.4  MCH 28.8 28.5 28.4  MCHC 31.5 32.7 32.6  RDW 19.0* 18.7* 19.0*  LYMPHSABS 1.5  --   --   MONOABS 2.9*  --   --   EOSABS 0.0  --   --   BASOSABS 0.1  --   --     Complete metabolic panel:  Recent Labs  Lab 07/15/2020 0815 07/22/2020 0851 07/18/20 0521 07/19/20 0618  NA 134*  --  134* 134*  K 3.5  --  3.9 4.1  CL 101  --  103 105  CO2 21*  --  21* 21*  GLUCOSE 116*  --  149* 107*  BUN 30*  --  42* 48*  CREATININE 1.78*  --  1.82* 1.84*  CALCIUM 8.6*  --  8.4* 8.9  AST 113*  --   --   --   ALT 33  --   --    --   ALKPHOS 67  --   --   --   BILITOT 0.9  --   --   --   ALBUMIN 3.2*  --   --   --   MG 2.0  --   --   --   DDIMER 4.26*  --   --   --   PROCALCITON 0.65  --   --   --   LATICACIDVEN 1.6  --  2.4*  --   INR 1.3*  --   --   --   HGBA1C  --   --  6.2*  --   BNP  --  764.7*  --   --     No results for input(s): LIPASE, AMYLASE in the last 168 hours.  Recent Labs  Lab 07/15/20 1014 07/18/2020 0815 08/06/2020 0851  DDIMER  --  4.26*  --   BNP  --   --  764.7*  PROCALCITON  --  0.65  --   SARSCOV2NAA NEGATIVE NEGATIVE  --     ------------------------------------------------------------------------------------------------------------------ Recent Labs    07/18/20 0521  CHOL 111  HDL 38*  LDLCALC 59  TRIG 68  CHOLHDL 2.9    Lab Results  Component Value Date   HGBA1C 6.2 (H) 07/18/2020   ------------------------------------------------------------------------------------------------------------------ No results for input(s): TSH, T4TOTAL, T3FREE, THYROIDAB in the last 72 hours.  Invalid input(s): FREET3 ------------------------------------------------------------------------------------------------------------------ No results for input(s): VITAMINB12, FOLATE, FERRITIN, TIBC, IRON, RETICCTPCT in the last 72 hours.  Coagulation profile Recent Labs  Lab 08/09/2020 0815  INR 1.3*   Recent Labs    08/02/2020 0815  DDIMER 4.26*    Cardiac Enzymes No results for input(s): CKTOTAL, CKMB, CKMBINDEX, TROPONINI in the last 168 hours.  ------------------------------------------------------------------------------------------------------------------    Component Value Date/Time   BNP 764.7 (H) 08/08/2020 0851     Antibiotics: Anti-infectives (From admission, onward)   Start     Dose/Rate Route Frequency Ordered Stop   07/18/20 1000  doxycycline (VIBRAMYCIN) 100 mg in sodium chloride 0.9 % 250 mL IVPB        100 mg 125 mL/hr over 120 Minutes Intravenous Every 12  hours 08/02/2020 1805     07/13/2020 2000  cefTRIAXone (ROCEPHIN) 1 g in sodium chloride 0.9 % 100 mL IVPB        1 g 200 mL/hr over 30 Minutes Intravenous Every 24 hours 07/23/2020 1252     08/04/2020 1815  doxycycline (VIBRAMYCIN)  200 mg in dextrose 5 % 250 mL IVPB  Status:  Discontinued        200 mg 125 mL/hr over 120 Minutes Intravenous Every 12 hours 07/20/2020 1804 08/04/2020 1805   07/15/2020 1300  azithromycin (ZITHROMAX) 500 mg in sodium chloride 0.9 % 250 mL IVPB  Status:  Discontinued        500 mg 250 mL/hr over 60 Minutes Intravenous Every 24 hours 07/30/2020 1252 07/19/2020 1804   08/06/2020 0815  vancomycin (VANCOREADY) IVPB 1500 mg/300 mL        1,500 mg 150 mL/hr over 120 Minutes Intravenous  Once 07/16/2020 0807 07/16/2020 1457   08/03/2020 0815  ceFEPIme (MAXIPIME) 2 g in sodium chloride 0.9 % 100 mL IVPB        2 g 200 mL/hr over 30 Minutes Intravenous  Once 08/04/2020 0807 07/30/2020 1110       Radiology Reports  No results found.    DVT prophylaxis: Lovenox  Code Status: DNR  Family Communication: No family at bedside   Consultants:    Procedures:      Objective    Physical Examination:   General-appears in no acute distress Heart-S1-S2, regular, no murmur auscultated Lungs-bilateral rhonchi auscultated Abdomen-soft, nontender, no organomegaly Extremities-no edema in the lower extremities Neuro-alert, oriented x3, no focal deficit noted  Status is: Inpatient  Dispo: The patient is from: Home              Anticipated d/c is to: Home              Anticipated d/c date is: 07/20/2020              Patient currently not stable for discharge  Barrier to discharge-ongoing treatment for pneumonia  COVID-19 Labs  Recent Labs    08/10/2020 0815  DDIMER 4.26*    Lab Results  Component Value Date   [place] NEGATIVE 07/21/2020   [place] NEGATIVE 07/15/2020   [place] NEGATIVE 01/24/2020   [place] NEGATIVE 07/09/2018    Microbiology  Recent  Results (from the past 240 hour(s))  Resp Panel by RT-PCR (Flu A&B, Covid) Nasopharyngeal Swab     Status: None   Collection Time: 07/15/20 10:14 AM   Specimen: Nasopharyngeal Swab; Nasopharyngeal(NP) swabs in vial transport medium  Result Value Ref Range Status   SARS Coronavirus 2 by RT PCR NEGATIVE NEGATIVE Final    Comment: (NOTE) SARS-CoV-2 target nucleic acids are NOT DETECTED.  The SARS-CoV-2 RNA is generally detectable in upper respiratory specimens during the acute phase of infection. The lowest concentration of SARS-CoV-2 viral copies this assay can detect is 138 copies/mL. A negative result does not preclude SARS-Cov-2 infection and should not be used as the sole basis for treatment or other patient management decisions. A negative result may occur with  improper specimen collection/handling, submission of specimen other than nasopharyngeal swab, presence of viral mutation(s) within the areas targeted by this assay, and inadequate number of viral copies(<138 copies/mL). A negative result must be combined with clinical observations, patient history, and epidemiological information. The expected result is Negative.  Fact Sheet for Patients:  EntrepreneurPulse.com.au  Fact Sheet for Healthcare Providers:  IncredibleEmployment.be  This test is no t yet approved or cleared by the [place] FDA and  has been authorized for detection and/or diagnosis of SARS-CoV-2 by FDA under an Emergency Use Authorization (EUA). This EUA will remain  in effect (meaning this test can be used) for the duration of the COVID-19 declaration under Section 564(b)(1)  of the Act, 21 U.S.C.section 360bbb-3(b)(1), unless the authorization is terminated  or revoked sooner.       Influenza A by PCR NEGATIVE NEGATIVE Final   Influenza B by PCR NEGATIVE NEGATIVE Final    Comment: (NOTE) The Xpert Xpress SARS-CoV-2/FLU/RSV plus assay is intended as an aid in the  diagnosis of influenza from Nasopharyngeal swab specimens and should not be used as a sole basis for treatment. Nasal washings and aspirates are unacceptable for Xpert Xpress SARS-CoV-2/FLU/RSV testing.  Fact Sheet for Patients: EntrepreneurPulse.com.au  Fact Sheet for Healthcare Providers: IncredibleEmployment.be  This test is not yet approved or cleared by the [place] FDA and has been authorized for detection and/or diagnosis of SARS-CoV-2 by FDA under an Emergency Use Authorization (EUA). This EUA will remain in effect (meaning this test can be used) for the duration of the COVID-19 declaration under Section 564(b)(1) of the Act, 21 U.S.C. section 360bbb-3(b)(1), unless the authorization is terminated or revoked.  Performed at [facility], [address], [place], [place] [address]   Blood culture (routine single)     Status: None (Preliminary result)   Collection Time: 07/21/2020  8:15 AM   Specimen: BLOOD  Result Value Ref Range Status   Specimen Description BLOOD RIGHT ANTECUBITAL  Final   Special Requests   Final    BOTTLES DRAWN AEROBIC AND ANAEROBIC Blood Culture adequate volume   Culture   Final    NO GROWTH 2 DAYS Performed at [facility], [address] [place], [place] [address]    Report Status PENDING  Incomplete  Urine culture     Status: None   Collection Time: 07/26/2020  8:15 AM   Specimen: In/Out Cath Urine  Result Value Ref Range Status   Specimen Description   Final    IN/OUT CATH URINE Performed at [facility], [address], [place], [place] [address]    Special Requests   Final    NONE Performed at [facility], [address], [place], [place] [address]    Culture   Final    NO GROWTH Performed at [facility], [place] [address], [place],  [address]    Report Status 07/18/2020 FINAL  Final  Resp Panel by RT-PCR (Flu A&B, Covid)     Status: None    Collection Time: 07/29/2020  8:15 AM   Specimen: Nasopharyngeal(NP) swabs in vial transport medium  Result Value Ref Range Status   SARS Coronavirus 2 by RT PCR NEGATIVE NEGATIVE Final    Comment: (NOTE) SARS-CoV-2 target nucleic acids are NOT DETECTED.  The SARS-CoV-2 RNA is generally detectable in upper respiratory specimens during the acute phase of infection. The lowest concentration of SARS-CoV-2 viral copies this assay can detect is 138 copies/mL. A negative result does not preclude SARS-Cov-2 infection and should not be used as the sole basis for treatment or other patient management decisions. A negative result may occur with  improper specimen collection/handling, submission of specimen other than nasopharyngeal swab, presence of viral mutation(s) within the areas targeted by this assay, and inadequate number of viral copies(<138 copies/mL). A negative result must be combined with clinical observations, patient history, and epidemiological information. The expected result is Negative.  Fact Sheet for Patients:  EntrepreneurPulse.com.au  Fact Sheet for Healthcare Providers:  IncredibleEmployment.be  This test is no t yet approved or cleared by the [place] FDA and  has been authorized for detection and/or diagnosis of SARS-CoV-2 by FDA under an  Emergency Use Authorization (EUA). This EUA will remain  in effect (meaning this test can be used) for the duration of the COVID-19 declaration under Section 564(b)(1) of the Act, 21 U.S.C.section 360bbb-3(b)(1), unless the authorization is terminated  or revoked sooner.       Influenza A by PCR NEGATIVE NEGATIVE Final   Influenza B by PCR NEGATIVE NEGATIVE Final    Comment: (NOTE) The Xpert Xpress SARS-CoV-2/FLU/RSV plus assay is intended as an aid in the diagnosis of influenza from Nasopharyngeal swab specimens and should not be used as a sole basis for treatment. Nasal washings  and aspirates are unacceptable for Xpert Xpress SARS-CoV-2/FLU/RSV testing.  Fact Sheet for Patients: EntrepreneurPulse.com.au  Fact Sheet for Healthcare Providers: IncredibleEmployment.be  This test is not yet approved or cleared by the [place] FDA and has been authorized for detection and/or diagnosis of SARS-CoV-2 by FDA under an Emergency Use Authorization (EUA). This EUA will remain in effect (meaning this test can be used) for the duration of the COVID-19 declaration under Section 564(b)(1) of the Act, 21 U.S.C. section 360bbb-3(b)(1), unless the authorization is terminated or revoked.  Performed at [facility], [address] [place], [place] [address]          [facility] If 7PM-7AM, please contact night-coverage at [email or web address], Office  [phone]   07/19/2020, 3:57 PM  LOS: 2 days

## 2020-07-19 NOTE — Progress Notes (Signed)
*  PRELIMINARY RESULTS* Echocardiogram 2D Echocardiogram has been performed.  Sherrie Sport 07/19/2020, 10:43 AM

## 2020-07-20 ENCOUNTER — Inpatient Hospital Stay: Payer: PPO

## 2020-07-20 LAB — CBC
HCT: 30.1 % — ABNORMAL LOW (ref 39.0–52.0)
Hemoglobin: 9.8 g/dL — ABNORMAL LOW (ref 13.0–17.0)
MCH: 28.2 pg (ref 26.0–34.0)
MCHC: 32.6 g/dL (ref 30.0–36.0)
MCV: 86.5 fL (ref 80.0–100.0)
Platelets: 101 10*3/uL — ABNORMAL LOW (ref 150–400)
RBC: 3.48 MIL/uL — ABNORMAL LOW (ref 4.22–5.81)
RDW: 19.2 % — ABNORMAL HIGH (ref 11.5–15.5)
WBC: 20.6 10*3/uL — ABNORMAL HIGH (ref 4.0–10.5)
nRBC: 0.1 % (ref 0.0–0.2)

## 2020-07-20 LAB — COMPREHENSIVE METABOLIC PANEL
ALT: 54 U/L — ABNORMAL HIGH (ref 0–44)
AST: 66 U/L — ABNORMAL HIGH (ref 15–41)
Albumin: 2.8 g/dL — ABNORMAL LOW (ref 3.5–5.0)
Alkaline Phosphatase: 66 U/L (ref 38–126)
Anion gap: 11 (ref 5–15)
BUN: 50 mg/dL — ABNORMAL HIGH (ref 8–23)
CO2: 20 mmol/L — ABNORMAL LOW (ref 22–32)
Calcium: 9.2 mg/dL (ref 8.9–10.3)
Chloride: 108 mmol/L (ref 98–111)
Creatinine, Ser: 1.46 mg/dL — ABNORMAL HIGH (ref 0.61–1.24)
GFR, Estimated: 46 mL/min — ABNORMAL LOW (ref >60)
Glucose, Bld: 140 mg/dL — ABNORMAL HIGH (ref 70–99)
Potassium: 4.3 mmol/L (ref 3.5–5.1)
Sodium: 139 mmol/L (ref 135–145)
Total Bilirubin: 0.6 mg/dL (ref 0.3–1.2)
Total Protein: 6.5 g/dL (ref 6.5–8.1)

## 2020-07-20 MED ORDER — METOPROLOL SUCCINATE ER 25 MG PO TB24
25.0000 mg | ORAL_TABLET | Freq: Every day | ORAL | Status: DC
  Administered 2020-07-20 – 2020-07-26 (×7): 25 mg via ORAL
  Filled 2020-07-20 (×7): qty 1

## 2020-07-20 MED ORDER — FUROSEMIDE 10 MG/ML IJ SOLN
20.0000 mg | Freq: Once | INTRAMUSCULAR | Status: AC
  Administered 2020-07-20: 20 mg via INTRAVENOUS
  Filled 2020-07-20: qty 2

## 2020-07-20 NOTE — Care Management Important Message (Signed)
Important Message  Patient Details  Name: NICKALUS THORNSBERRY MRN: [id number] Date of Birth: Jun 18, 1928   Medicare Important Message Given:  N/A - LOS <3 / Initial given by admissions  Initial Medicare IM reviewed with patient by S. Geanie Cooley, Patient Access Associate on 07/19/2020 at 9:09am.   Dannette Barbara 07/20/2020, 8:38 AM

## 2020-07-20 NOTE — Evaluation (Signed)
Physical Therapy Evaluation Patient Details Name: Aaron Burton MRN: [id number] DOB: August 10, 1928 Today's Date: 07/20/2020   History of Present Illness  87 year old male with HTN, HLD, CAD, chronic kidney disease stage IIIb, chronic thrombocytopenia came into the hospital with cough, shortness of breath, fever and chills.  CT angiogram on admission was negative for PE but did show infiltrates in the left lower lobe and bilateral upper lobes.  Clinical Impression  Pt did well with mobility and ambulation but is much slower and more limited than his baseline.  He needed 4L O2 just to maintain sats in the 90s and generally was in the high 80s with activity.  He went ~90 ft (45 with walker, 45 w/o) and was more stable and confident with AD, though he had no LOBs w/o it.  Pt did need consistent cuing for pursed lip and deliberate breathing but responded well to cuing and overall showed great effort.  Pt lives alone but daughter reports she and her brother can provide as much assist as needed at home including 24/7 if needed.  Pt will benefit from HHPT to work on activity tolerance and monitor cardio-pulm status as he transitions back to more active baseline levels.      Follow Up Recommendations Home health PT;Supervision - Intermittent    Equipment Recommendations  Rolling walker with 5" wheels (likely O2 per progress)    Recommendations for Other Services       Precautions / Restrictions Precautions Precautions: Fall Restrictions Weight Bearing Restrictions: No      Mobility  Bed Mobility               General bed mobility comments: in recliner on arrival, returned to recliner    Transfers Overall transfer level: Modified independent Equipment used: Rolling walker (2 wheeled)             General transfer comment: Pt able to rise to standing w/o assist or hesitation, not overly reliant on UEs  Ambulation/Gait Ambulation/Gait assistance: Supervision Gait Distance (Feet): 90  Feet Assistive device: Rolling walker (2 wheeled);None       General Gait Details: Pt on 4L O2 t/o the effort with sats dropping to 86% on 2 occasions but generally staying 88-91% with deliberate breathing.  Pt did have some fatigue with the effort but never was overly short of breath.  Pt with more consistent/safer ambulation with walker but managed 67ft w/o AD and had no LOBs or overt safety issues.  Stairs            Wheelchair Mobility    Modified Rankin (Stroke Patients Only)       Balance Overall balance assessment: Modified Independent                                           Pertinent Vitals/Pain Pain Assessment: No/denies pain (minimal soreness from chronic back pain)    Home Living Family/patient expects to be discharged to:: Private residence Living Arrangements: Alone Available Help at Discharge: Family Type of Home: House Home Access: Stairs to enter   Technical brewer of Steps: 3   Home Equipment: Cane - single point      Prior Function Level of Independence: Independent         Comments: pt drives, Printmaker (weed wacking, push and ride mower, etc) and runs all his errands, etc w/o AD  Hand Dominance        Extremity/Trunk Assessment   Upper Extremity Assessment Upper Extremity Assessment: Overall WFL for tasks assessed;Generalized weakness    Lower Extremity Assessment Lower Extremity Assessment: Overall WFL for tasks assessed;Generalized weakness       Communication   Communication: No difficulties  Cognition Arousal/Alertness: Awake/alert Behavior During Therapy: WFL for tasks assessed/performed Overall Cognitive Status: Within Functional Limits for tasks assessed                                        General Comments      Exercises     Assessment/Plan    PT Assessment Patient needs continued PT services  PT Problem List Decreased strength;Decreased activity  tolerance;Decreased range of motion;Decreased balance;Decreased knowledge of use of DME;Cardiopulmonary status limiting activity;Decreased mobility       PT Treatment Interventions DME instruction;Gait training;Stair training;Functional mobility training;Therapeutic activities;Therapeutic exercise;Balance training;Neuromuscular re-education;Patient/family education    PT Goals (Current goals can be found in the Care Plan section)  Acute Rehab PT Goals Patient Stated Goal: go home PT Goal Formulation: With patient Time For Goal Achievement: 08/03/20 Potential to Achieve Goals: Good    Frequency Min 2X/week   Barriers to discharge        Co-evaluation               AM-PAC PT "6 Clicks" Mobility  Outcome Measure Help needed turning from your back to your side while in a flat bed without using bedrails?: None Help needed moving from lying on your back to sitting on the side of a flat bed without using bedrails?: None Help needed moving to and from a bed to a chair (including a wheelchair)?: None Help needed standing up from a chair using your arms (e.g., wheelchair or bedside chair)?: None Help needed to walk in hospital room?: None Help needed climbing 3-5 steps with a railing? : None 6 Click Score: 24    End of Session Equipment Utilized During Treatment: Gait belt Activity Tolerance: Patient limited by fatigue;Patient tolerated treatment well Patient left: with chair alarm set;with family/visitor present;with call bell/phone within reach Nurse Communication: Mobility status (O2 during ambulation) PT Visit Diagnosis: Difficulty in walking, not elsewhere classified (R26.2);Muscle weakness (generalized) (M62.81)    Time: 3329-5188 PT Time Calculation (min) (ACUTE ONLY): 31 min   Charges:   PT Evaluation $PT Eval Low Complexity: 1 Low PT Treatments $Gait Training: 8-22 mins        Kreg Shropshire, DPT 07/20/2020, 4:52 PM

## 2020-07-20 NOTE — Progress Notes (Signed)
[facility] Cardiology    SUBJECTIVE: The patient reports feeling about the same as yesterday. He does report blood-tinged sputum, no clots. He denies chest pain. He continues to have mild shortness of breath at rest on supplemental oxygen. He denies palpitations.   Vitals:   07/19/20 2109 07/19/20 2115 07/20/20 0425 07/20/20 0757  BP: (!) 153/99  122/74 (!) 156/85  Pulse: 69  70 97  Resp: 20  18   Temp: 98.6 F (37 C)  98.4 F (36.9 C) 98.1 F (36.7 C)  TempSrc:   Oral Oral  SpO2: 94% 92% 92% (!) 87%  Weight:   79 kg   Height:         Intake/Output Summary (Last 24 hours) at 07/20/2020 7654 Last data filed at 07/20/2020 0541 Gross per 24 hour  Intake 1682.6 ml  Output 450 ml  Net 1232.6 ml      PHYSICAL EXAM  General: Well developed, well nourished, elderly gentleman, sitting up in bed eating breakfast, in no acute distress HEENT:  Normocephalic and atramatic Neck:  No JVD.  Lungs: slight conversational dyspnea, coarse rhonchi throughout and crackles, on supplemental oxygen via [place]. Heart: irregularly irregular without gallops or murmurs.  Abdomen: no distention Extremities: No clubbing, cyanosis or edema.   Neuro: Alert and oriented X 3. Psych:  Good affect, responds appropriately   LABS: Basic Metabolic Panel: Recent Labs    07/19/20 0618 07/20/20 0532  NA 134* 139  K 4.1 4.3  CL 105 108  CO2 21* 20*  GLUCOSE 107* 140*  BUN 48* 50*  CREATININE 1.84* 1.46*  CALCIUM 8.9 9.2   Liver Function Tests: Recent Labs    07/20/20 0532  AST 66*  ALT 54*  ALKPHOS 66  BILITOT 0.6  PROT 6.5  ALBUMIN 2.8*   No results for input(s): LIPASE, AMYLASE in the last 72 hours. CBC: Recent Labs    07/19/20 0618 07/20/20 0532  WBC 19.7* 20.6*  HGB 9.7* 9.8*  HCT 29.8* 30.1*  MCV 87.4 86.5  PLT 90* 101*   Cardiac Enzymes: No results for input(s): CKTOTAL, CKMB, CKMBINDEX, TROPONINI in the last 72 hours. BNP: Invalid input(s): POCBNP D-Dimer: No results for input(s):  DDIMER in the last 72 hours. Hemoglobin A1C: Recent Labs    07/18/20 0521  HGBA1C 6.2*   Fasting Lipid Panel: Recent Labs    07/18/20 0521  CHOL 111  HDL 38*  LDLCALC 59  TRIG 68  CHOLHDL 2.9   Thyroid Function Tests: No results for input(s): TSH, T4TOTAL, T3FREE, THYROIDAB in the last 72 hours.  Invalid input(s): FREET3 Anemia Panel: No results for input(s): VITAMINB12, FOLATE, FERRITIN, TIBC, IRON, RETICCTPCT in the last 72 hours.  ECHOCARDIOGRAM COMPLETE  Result Date: 07/19/2020    ECHOCARDIOGRAM REPORT   Patient Name:   Aaron Burton Date of Exam: 07/19/2020 Medical Rec #:  [id number]     Height:       66.0 in Accession #:    [id number]    Weight:       160.0 lb Date of Birth:  1928-08-08     BSA:          1.819 m Patient Age:    87 years      BP:           100/63 mmHg Patient Gender: M             HR:           63 bpm. Exam Location:  [facility]  Procedure: 2D Echo, Cardiac Doppler and Color Doppler Indications:     Elevated Troponin  History:         Patient has no prior history of Echocardiogram examinations.                  CAD; Risk Factors:Hypertension. Pneumonia.  Sonographer:     Sherrie Sport RDCS (AE) Referring Phys:  [place] Diagnosing Phys: Isaias Cowman MD IMPRESSIONS  1. Left ventricular ejection fraction, by estimation, is 60 to 65%. The left ventricle has normal function. The left ventricle has no regional wall motion abnormalities. Left ventricular diastolic parameters are indeterminate.  2. Right ventricular systolic function is normal. The right ventricular size is normal.  3. The mitral valve is normal in structure. Moderate mitral valve regurgitation. No evidence of mitral stenosis.  4. Tricuspid valve regurgitation is mild to moderate.  5. The aortic valve is normal in structure. Aortic valve regurgitation is not visualized. Mild aortic valve sclerosis is present, with no evidence of aortic valve stenosis.  6. The inferior vena cava is normal in size with  greater than 50% respiratory variability, suggesting right atrial pressure of 3 mmHg. FINDINGS  Left Ventricle: Left ventricular ejection fraction, by estimation, is 60 to 65%. The left ventricle has normal function. The left ventricle has no regional wall motion abnormalities. The left ventricular internal cavity size was normal in size. There is  no left ventricular hypertrophy. Left ventricular diastolic parameters are indeterminate. Right Ventricle: The right ventricular size is normal. No increase in right ventricular wall thickness. Right ventricular systolic function is normal. Left Atrium: Left atrial size was normal in size. Right Atrium: Right atrial size was normal in size. Pericardium: There is no evidence of pericardial effusion. Mitral Valve: The mitral valve is normal in structure. Moderate mitral valve regurgitation. No evidence of mitral valve stenosis. Tricuspid Valve: The tricuspid valve is normal in structure. Tricuspid valve regurgitation is mild to moderate. No evidence of tricuspid stenosis. Aortic Valve: The aortic valve is normal in structure. Aortic valve regurgitation is not visualized. Mild aortic valve sclerosis is present, with no evidence of aortic valve stenosis. Aortic valve mean gradient measures 6.7 mmHg. Aortic valve peak gradient measures 12.2 mmHg. Aortic valve area, by VTI measures 2.92 cm. Pulmonic Valve: The pulmonic valve was normal in structure. Pulmonic valve regurgitation is not visualized. No evidence of pulmonic stenosis. Aorta: The aortic root is normal in size and structure. Venous: The inferior vena cava is normal in size with greater than 50% respiratory variability, suggesting right atrial pressure of 3 mmHg. IAS/Shunts: No atrial level shunt detected by color flow Doppler.  LEFT VENTRICLE PLAX 2D LVIDd:         3.42 cm LVIDs:         2.43 cm LV PW:         1.72 cm LV IVS:        0.80 cm LVOT diam:     2.00 cm LV SV:         79 LV SV Index:   44 LVOT Area:      3.14 cm  RIGHT VENTRICLE RV Basal diam:  2.88 cm RV S prime:     11.60 cm/s TAPSE (M-mode): 3.7 cm LEFT ATRIUM           Index       RIGHT ATRIUM           Index LA diam:      4.60  cm 2.53 cm/m  RA Area:     13.80 cm LA Vol (A4C): 70.0 ml 38.49 ml/m RA Volume:   29.40 ml  16.16 ml/m  AORTIC VALVE                    PULMONIC VALVE AV Area (Vmax):    2.94 cm     PV Vmax:        0.96 m/s AV Area (Vmean):   2.68 cm     PV Peak grad:   3.7 mmHg AV Area (VTI):     2.92 cm     RVOT Peak grad: 3 mmHg AV Vmax:           175.00 cm/s AV Vmean:          116.967 cm/s AV VTI:            0.271 m AV Peak Grad:      12.2 mmHg AV Mean Grad:      6.7 mmHg LVOT Vmax:         164.00 cm/s LVOT Vmean:        99.800 cm/s LVOT VTI:          0.252 m LVOT/AV VTI ratio: 0.93  AORTA Ao Root diam: 3.30 cm MITRAL VALVE                TRICUSPID VALVE MV Area (PHT): 3.89 cm     TR Peak grad:   46.8 mmHg MV Decel Time: 195 msec     TR Vmax:        342.00 cm/s MV E velocity: 128.00 cm/s                             SHUNTS                             Systemic VTI:  0.25 m                             Systemic Diam: 2.00 cm Isaias Cowman MD Electronically signed by Isaias Cowman MD Signature Date/Time: 07/19/2020/4:14:01 PM    Final      Echo normal LVEF, no RWMA, moderate MR, mild to moderate TR  TELEMETRY: atrial fibrillation, rate 80s-100 bpm  ASSESSMENT AND PLAN:  Principal Problem:   CAP (community acquired pneumonia) Active Problems:   Coronary artery disease   Hypercholesterolemia   Hypertension   Elevated troponin   Sepsis (HCC)   Thrombocytopenia (HCC)   CKD (chronic kidney disease), stage IIIb   Acute respiratory failure with hypoxia (HCC)    1.Elevated troponin ([phone], [phone], 457),in the setting of multifocal pneumonia, in the absence of chest pain, with nondiagnostic ECG, likely due to demand supply ischemia, not due to acute coronary syndrome. Echocardiogram shows normal left ventricular function  with no regional wall motion abnormalities. ( I21.A1 ) 2.Multifocal community-acquired pneumoniaon broad-spectrum antibiotics 3.Chronic kidney disease stage IIIb, BUN and creatinine 50 and 1.46, respectively 4.Thrombocytopenia, platelet count 90 5.  Retinal artery occlusion in 04/2020, switched to Plavix, continued statin, carotid ultrasound with mild to moderate plaque 6. Paroxysmal atrial fibrillation, noted on telemetry this admission, chads vasc score of 6. Rate adequately controlled today. 7. Hypertension, on amlodipine at home, blood pressure mildly elevated this morning in setting of steroid use.  8. Blood tinged sputum 9.  Small pleural effusions  Recommendations: 1. Continue low dose Eliquis for stroke prevention 2. Add metoprolol succinate 25 mg for blood pressure and rate control  3. IV Lasix 20 mg BID today with careful monitoring of renal function; may continue in AM 4. Management of pneumonia and hemoptysis per hospitalist 5. Plan to follow up with Dr. Nehemiah Massed in 1 week from discharge and likely Holter monitor placement as outpatient.     Clabe Seal, PA-C 07/20/2020 8:23 AM

## 2020-07-20 NOTE — Progress Notes (Signed)
PROGRESS NOTE  Aaron Burton IRJ:[id number] DOB: 10/16/1928 DOA: 07/14/2020 PCP: Dion Body, MD   LOS: 3 days   Brief Narrative / Interim history: 87 year old male with HTN, HLD, CAD, chronic kidney disease stage IIIb, chronic thrombocytopenia came into the hospital with cough, shortness of breath, fever and chills.  CT angiogram on admission was negative for PE but did show infiltrates in the left lower lobe and bilateral upper lobes.  He was febrile at 102.9, tachycardic, tachypneic.  He was also hypoxic on room air requiring supplemental oxygen.  Subjective / 24h Interval events: He is having increased coughing this morning, also noticed some blood in his sputum  Assessment & Plan: Principal Problem Acute hypoxic respiratory failure due to multifocal community-acquired pneumonia -continue supplemental oxygen to maintain sats above 88%, currently requiring 4 L.  Hemoptysis is new but he reports that he has had that in the past when he had pneumonia. -Continue antibiotics with ceftriaxone and doxycycline, on steroids as well -Continue nebulizers -A 2D echo done 6/7 showed EF 60-65%, indeterminate diastolic parameters  Active Problems Elevated troponin, possible acute on chronic diastolic CHF-up to 301, trending down.  Cardiology consulted, felt to be demand ischemia. -Given hemoptysis and bilateral layering pleural effusions will be given Lasix today  Elevated D-dimer -CT angio negative for PE, lower extremity Doppler pending  Coronary artery disease -no chest pain, continue Plavix, Zocor  Paroxysmal A. fib-noted on telemetry, cardiology started low-dose Eliquis and his Plavix was discontinued.  If hemoptysis persists may need to readdress anticoagulation  Thrombocytopenia-chronic, monitor  Chronic kidney disease stage IIIb -Baseline creatinine ranges 1.4-1.8, currently at baseline.  Monitor with Lasix  Essential hypertension-continue metoprolol  Scheduled Meds: . apixaban   2.5 mg Oral BID  . cholecalciferol  5,000 Units Oral Once per day on Mon Thu  . furosemide  20 mg Intravenous Once  . gabapentin  600 mg Oral QHS  . guaiFENesin  1,200 mg Oral BID  . ipratropium-albuterol  3 mL Nebulization TID  . methylPREDNISolone (SOLU-MEDROL) injection  40 mg Intravenous Q12H  . metoprolol succinate  25 mg Oral Daily  . multivitamin with minerals  1 tablet Oral Daily  . simvastatin  20 mg Oral QPM   Continuous Infusions: . sodium chloride 10 mL/hr at 07/20/20 0535  . cefTRIAXone (ROCEPHIN)  IV Stopped (07/19/20 2215)  . doxycycline (VIBRAMYCIN) IV Stopped (07/20/20 0020)   PRN Meds:.sodium chloride, acetaminophen, albuterol, benzonatate, hydrALAZINE, ondansetron (ZOFRAN) IV, senna-docusate  Diet Orders (From admission, onward)    Start     Ordered   08/03/2020 1153  Diet Heart Room service appropriate? Yes; Fluid consistency: Thin  Diet effective now       Question Answer Comment  Room service appropriate? Yes   Fluid consistency: Thin   Na restriction, if any: 2 gm Na      07/16/2020 1152          DVT prophylaxis: apixaban (ELIQUIS) tablet 2.5 mg Start: 07/19/20 2200 apixaban (ELIQUIS) tablet 2.5 mg     Code Status: DNR  Family Communication: no family at bedside   Status is: Inpatient  Remains inpatient appropriate because:Inpatient level of care appropriate due to severity of illness   Dispo: The patient is from: Home              Anticipated d/c is to: Home              Patient currently is not medically stable to d/c.   Difficult to place patient  No  Level of care: Progressive Cardiac  Consultants:  Cardiology   Procedures:  2D echo  Microbiology  none  Antimicrobials: Ceftriaxone / Azithromycin 6/5, today is day #5   Objective: Vitals:   07/20/20 0425 07/20/20 0757 07/20/20 0819 07/20/20 0824  BP: 122/74 (!) 156/85    Pulse: 70 97    Resp: 18     Temp: 98.4 F (36.9 C) 98.1 F (36.7 C)    TempSrc: Oral Oral    SpO2: 92%  (!) 87% (!) 88% 94%  Weight: 79 kg     Height:        Intake/Output Summary (Last 24 hours) at 07/20/2020 0931 Last data filed at 07/20/2020 0541 Gross per 24 hour  Intake 1682.6 ml  Output 450 ml  Net 1232.6 ml   Filed Weights   07/26/2020 0809 07/20/20 0425  Weight: 72.6 kg 79 kg    Examination:  Constitutional: Visibly tachypneic Eyes: no scleral icterus ENMT: Mucous membranes are moist.  Neck: normal, supple Respiratory: Diffuse bilateral rhonchi, coarse breath sounds throughout.  Scant end expiratory wheezing Cardiovascular: Regular rate and rhythm, no murmurs / rubs / gallops. No LE edema Abdomen: non distended, no tenderness. Bowel sounds positive.  Musculoskeletal: no clubbing / cyanosis.  Skin: no rashes Neurologic: Nonfocal  Data Reviewed: I have independently reviewed following labs and imaging studies   CBC: Recent Labs  Lab 07/24/2020 0815 07/18/20 0521 07/19/20 0618 07/20/20 0532  WBC 11.6* 19.8* 19.7* 20.6*  NEUTROABS 6.9  --   --   --   HGB 10.9* 9.9* 9.7* 9.8*  HCT 34.6* 30.3* 29.8* 30.1*  MCV 91.3 87.3 87.4 86.5  PLT 87* 97* 90* 409*   Basic Metabolic Panel: Recent Labs  Lab 07/16/2020 0815 07/18/20 0521 07/19/20 0618 07/20/20 0532  NA 134* 134* 134* 139  K 3.5 3.9 4.1 4.3  CL 101 103 105 108  CO2 21* 21* 21* 20*  GLUCOSE 116* 149* 107* 140*  BUN 30* 42* 48* 50*  CREATININE 1.78* 1.82* 1.84* 1.46*  CALCIUM 8.6* 8.4* 8.9 9.2  MG 2.0  --   --   --    Liver Function Tests: Recent Labs  Lab 07/24/2020 0815 07/20/20 0532  AST 113* 66*  ALT 33 54*  ALKPHOS 67 66  BILITOT 0.9 0.6  PROT 6.8 6.5  ALBUMIN 3.2* 2.8*   Coagulation Profile: Recent Labs  Lab 07/26/2020 0815  INR 1.3*   HbA1C: Recent Labs    07/18/20 0521  HGBA1C 6.2*   CBG: No results for input(s): GLUCAP in the last 168 hours.  Recent Results (from the past 240 hour(s))  Resp Panel by RT-PCR (Flu A&B, Covid) Nasopharyngeal Swab     Status: None   Collection Time:  07/15/20 10:14 AM   Specimen: Nasopharyngeal Swab; Nasopharyngeal(NP) swabs in vial transport medium  Result Value Ref Range Status   SARS Coronavirus 2 by RT PCR NEGATIVE NEGATIVE Final    Comment: (NOTE) SARS-CoV-2 target nucleic acids are NOT DETECTED.  The SARS-CoV-2 RNA is generally detectable in upper respiratory specimens during the acute phase of infection. The lowest concentration of SARS-CoV-2 viral copies this assay can detect is 138 copies/mL. A negative result does not preclude SARS-Cov-2 infection and should not be used as the sole basis for treatment or other patient management decisions. A negative result may occur with  improper specimen collection/handling, submission of specimen other than nasopharyngeal swab, presence of viral mutation(s) within the areas targeted by this assay, and inadequate  number of viral copies(<138 copies/mL). A negative result must be combined with clinical observations, patient history, and epidemiological information. The expected result is Negative.  Fact Sheet for Patients:  EntrepreneurPulse.com.au  Fact Sheet for Healthcare Providers:  IncredibleEmployment.be  This test is no t yet approved or cleared by the [place] FDA and  has been authorized for detection and/or diagnosis of SARS-CoV-2 by FDA under an Emergency Use Authorization (EUA). This EUA will remain  in effect (meaning this test can be used) for the duration of the COVID-19 declaration under Section 564(b)(1) of the Act, 21 U.S.C.section 360bbb-3(b)(1), unless the authorization is terminated  or revoked sooner.       Influenza A by PCR NEGATIVE NEGATIVE Final   Influenza B by PCR NEGATIVE NEGATIVE Final    Comment: (NOTE) The Xpert Xpress SARS-CoV-2/FLU/RSV plus assay is intended as an aid in the diagnosis of influenza from Nasopharyngeal swab specimens and should not be used as a sole basis for treatment. Nasal washings  and aspirates are unacceptable for Xpert Xpress SARS-CoV-2/FLU/RSV testing.  Fact Sheet for Patients: EntrepreneurPulse.com.au  Fact Sheet for Healthcare Providers: IncredibleEmployment.be  This test is not yet approved or cleared by the [place] FDA and has been authorized for detection and/or diagnosis of SARS-CoV-2 by FDA under an Emergency Use Authorization (EUA). This EUA will remain in effect (meaning this test can be used) for the duration of the COVID-19 declaration under Section 564(b)(1) of the Act, 21 U.S.C. section 360bbb-3(b)(1), unless the authorization is terminated or revoked.  Performed at [facility], [address], [place], [place] [address]   Blood culture (routine single)     Status: None (Preliminary result)   Collection Time: 08/07/2020  8:15 AM   Specimen: BLOOD  Result Value Ref Range Status   Specimen Description BLOOD RIGHT ANTECUBITAL  Final   Special Requests   Final    BOTTLES DRAWN AEROBIC AND ANAEROBIC Blood Culture adequate volume   Culture   Final    NO GROWTH 3 DAYS Performed at [facility], [address] [place], [place] [address]    Report Status PENDING  Incomplete  Urine culture     Status: None   Collection Time: 07/28/2020  8:15 AM   Specimen: In/Out Cath Urine  Result Value Ref Range Status   Specimen Description   Final    IN/OUT CATH URINE Performed at [facility], [address], [place], [place] [address]    Special Requests   Final    NONE Performed at [facility], [address], [place], [place] [address]    Culture   Final    NO GROWTH Performed at [facility], [place] [address] [place], [place] [address]    Report Status 07/18/2020 FINAL  Final  Resp Panel by RT-PCR (Flu A&B, Covid)     Status: None   Collection Time: 08/10/2020  8:15 AM   Specimen: Nasopharyngeal(NP) swabs in vial transport medium  Result Value Ref Range  Status   SARS Coronavirus 2 by RT PCR NEGATIVE NEGATIVE Final    Comment: (NOTE) SARS-CoV-2 target nucleic acids are NOT DETECTED.  The SARS-CoV-2 RNA is generally detectable in upper respiratory specimens during the acute phase of infection. The lowest concentration of SARS-CoV-2 viral copies this assay can detect is 138 copies/mL. A negative result does not preclude SARS-Cov-2 infection and should not be used as the sole basis for treatment or other patient management decisions. A negative result  may occur with  improper specimen collection/handling, submission of specimen other than nasopharyngeal swab, presence of viral mutation(s) within the areas targeted by this assay, and inadequate number of viral copies(<138 copies/mL). A negative result must be combined with clinical observations, patient history, and epidemiological information. The expected result is Negative.  Fact Sheet for Patients:  EntrepreneurPulse.com.au  Fact Sheet for Healthcare Providers:  IncredibleEmployment.be  This test is no t yet approved or cleared by the [place] FDA and  has been authorized for detection and/or diagnosis of SARS-CoV-2 by FDA under an Emergency Use Authorization (EUA). This EUA will remain  in effect (meaning this test can be used) for the duration of the COVID-19 declaration under Section 564(b)(1) of the Act, 21 U.S.C.section 360bbb-3(b)(1), unless the authorization is terminated  or revoked sooner.       Influenza A by PCR NEGATIVE NEGATIVE Final   Influenza B by PCR NEGATIVE NEGATIVE Final    Comment: (NOTE) The Xpert Xpress SARS-CoV-2/FLU/RSV plus assay is intended as an aid in the diagnosis of influenza from Nasopharyngeal swab specimens and should not be used as a sole basis for treatment. Nasal washings and aspirates are unacceptable for Xpert Xpress SARS-CoV-2/FLU/RSV testing.  Fact Sheet for  Patients: EntrepreneurPulse.com.au  Fact Sheet for Healthcare Providers: IncredibleEmployment.be  This test is not yet approved or cleared by the [place] FDA and has been authorized for detection and/or diagnosis of SARS-CoV-2 by FDA under an Emergency Use Authorization (EUA). This EUA will remain in effect (meaning this test can be used) for the duration of the COVID-19 declaration under Section 564(b)(1) of the Act, 21 U.S.C. section 360bbb-3(b)(1), unless the authorization is terminated or revoked.  Performed at [facility], [address], [place], [place] [address]      Radiology Studies: ECHOCARDIOGRAM COMPLETE  Result Date: 07/19/2020    ECHOCARDIOGRAM REPORT   Patient Name:   Aaron Burton Date of Exam: 07/19/2020 Medical Rec #:  [id number]     Height:       66.0 in Accession #:    [id number]    Weight:       160.0 lb Date of Birth:  1929/01/02     BSA:          1.819 m Patient Age:    80 years      BP:           100/63 mmHg Patient Gender: M             HR:           63 bpm. Exam Location:  [facility] Procedure: 2D Echo, Cardiac Doppler and Color Doppler Indications:     Elevated Troponin  History:         Patient has no prior history of Echocardiogram examinations.                  CAD; Risk Factors:Hypertension. Pneumonia.  Sonographer:     Sherrie Sport RDCS (AE) Referring Phys:  [place] Diagnosing Phys: Isaias Cowman MD IMPRESSIONS  1. Left ventricular ejection fraction, by estimation, is 60 to 65%. The left ventricle has normal function. The left ventricle has no regional wall motion abnormalities. Left ventricular diastolic parameters are indeterminate.  2. Right ventricular systolic function is normal. The right ventricular size is normal.  3. The mitral valve is normal in structure. Moderate mitral valve regurgitation. No evidence of mitral stenosis.  4. Tricuspid valve regurgitation is mild to moderate.  5.  The aortic  valve is normal in structure. Aortic valve regurgitation is not visualized. Mild aortic valve sclerosis is present, with no evidence of aortic valve stenosis.  6. The inferior vena cava is normal in size with greater than 50% respiratory variability, suggesting right atrial pressure of 3 mmHg. FINDINGS  Left Ventricle: Left ventricular ejection fraction, by estimation, is 60 to 65%. The left ventricle has normal function. The left ventricle has no regional wall motion abnormalities. The left ventricular internal cavity size was normal in size. There is  no left ventricular hypertrophy. Left ventricular diastolic parameters are indeterminate. Right Ventricle: The right ventricular size is normal. No increase in right ventricular wall thickness. Right ventricular systolic function is normal. Left Atrium: Left atrial size was normal in size. Right Atrium: Right atrial size was normal in size. Pericardium: There is no evidence of pericardial effusion. Mitral Valve: The mitral valve is normal in structure. Moderate mitral valve regurgitation. No evidence of mitral valve stenosis. Tricuspid Valve: The tricuspid valve is normal in structure. Tricuspid valve regurgitation is mild to moderate. No evidence of tricuspid stenosis. Aortic Valve: The aortic valve is normal in structure. Aortic valve regurgitation is not visualized. Mild aortic valve sclerosis is present, with no evidence of aortic valve stenosis. Aortic valve mean gradient measures 6.7 mmHg. Aortic valve peak gradient measures 12.2 mmHg. Aortic valve area, by VTI measures 2.92 cm. Pulmonic Valve: The pulmonic valve was normal in structure. Pulmonic valve regurgitation is not visualized. No evidence of pulmonic stenosis. Aorta: The aortic root is normal in size and structure. Venous: The inferior vena cava is normal in size with greater than 50% respiratory variability, suggesting right atrial pressure of 3 mmHg. IAS/Shunts: No atrial level shunt detected by  color flow Doppler.  LEFT VENTRICLE PLAX 2D LVIDd:         3.42 cm LVIDs:         2.43 cm LV PW:         1.72 cm LV IVS:        0.80 cm LVOT diam:     2.00 cm LV SV:         79 LV SV Index:   44 LVOT Area:     3.14 cm  RIGHT VENTRICLE RV Basal diam:  2.88 cm RV S prime:     11.60 cm/s TAPSE (M-mode): 3.7 cm LEFT ATRIUM           Index       RIGHT ATRIUM           Index LA diam:      4.60 cm 2.53 cm/m  RA Area:     13.80 cm LA Vol (A4C): 70.0 ml 38.49 ml/m RA Volume:   29.40 ml  16.16 ml/m  AORTIC VALVE                    PULMONIC VALVE AV Area (Vmax):    2.94 cm     PV Vmax:        0.96 m/s AV Area (Vmean):   2.68 cm     PV Peak grad:   3.7 mmHg AV Area (VTI):     2.92 cm     RVOT Peak grad: 3 mmHg AV Vmax:           175.00 cm/s AV Vmean:          116.967 cm/s AV VTI:            0.271 m  AV Peak Grad:      12.2 mmHg AV Mean Grad:      6.7 mmHg LVOT Vmax:         164.00 cm/s LVOT Vmean:        99.800 cm/s LVOT VTI:          0.252 m LVOT/AV VTI ratio: 0.93  AORTA Ao Root diam: 3.30 cm MITRAL VALVE                TRICUSPID VALVE MV Area (PHT): 3.89 cm     TR Peak grad:   46.8 mmHg MV Decel Time: 195 msec     TR Vmax:        342.00 cm/s MV E velocity: 128.00 cm/s                             SHUNTS                             Systemic VTI:  0.25 m                             Systemic Diam: 2.00 cm Isaias Cowman MD Electronically signed by Isaias Cowman MD Signature Date/Time: 07/19/2020/4:14:01 PM    Final     Marzetta Board, MD, PhD [facility]  Between 7 am - 7 pm I am available, please contact me via Amion (for emergencies) or Securechat (non urgent messages)  Between 7 pm - 7 am I am not available, please contact night coverage MD/APP via Amion

## 2020-07-21 ENCOUNTER — Inpatient Hospital Stay: Payer: PPO

## 2020-07-21 LAB — COMPREHENSIVE METABOLIC PANEL
ALT: 47 U/L — ABNORMAL HIGH (ref 0–44)
AST: 50 U/L — ABNORMAL HIGH (ref 15–41)
Albumin: 2.9 g/dL — ABNORMAL LOW (ref 3.5–5.0)
Alkaline Phosphatase: 75 U/L (ref 38–126)
Anion gap: 10 (ref 5–15)
BUN: 52 mg/dL — ABNORMAL HIGH (ref 8–23)
CO2: 21 mmol/L — ABNORMAL LOW (ref 22–32)
Calcium: 9.3 mg/dL (ref 8.9–10.3)
Chloride: 109 mmol/L (ref 98–111)
Creatinine, Ser: 1.61 mg/dL — ABNORMAL HIGH (ref 0.61–1.24)
GFR, Estimated: 41 mL/min — ABNORMAL LOW (ref >60)
Glucose, Bld: 131 mg/dL — ABNORMAL HIGH (ref 70–99)
Potassium: 3.9 mmol/L (ref 3.5–5.1)
Sodium: 140 mmol/L (ref 135–145)
Total Bilirubin: 0.6 mg/dL (ref 0.3–1.2)
Total Protein: 6.7 g/dL (ref 6.5–8.1)

## 2020-07-21 LAB — CBC
HCT: 29.7 % — ABNORMAL LOW (ref 39.0–52.0)
Hemoglobin: 9.8 g/dL — ABNORMAL LOW (ref 13.0–17.0)
MCH: 28.7 pg (ref 26.0–34.0)
MCHC: 33 g/dL (ref 30.0–36.0)
MCV: 86.8 fL (ref 80.0–100.0)
Platelets: 122 10*3/uL — ABNORMAL LOW (ref 150–400)
RBC: 3.42 MIL/uL — ABNORMAL LOW (ref 4.22–5.81)
RDW: 19.2 % — ABNORMAL HIGH (ref 11.5–15.5)
WBC: 35 10*3/uL — ABNORMAL HIGH (ref 4.0–10.5)
nRBC: 0 % (ref 0.0–0.2)

## 2020-07-21 IMAGING — CT CT CHEST W/O CM
2 of 3 series · 15 of 36 positions shown, 18 images · non-contrast
Comparison: [DATE]

CLINICAL DATA: Worsening shortness of breath and leukocytosis.

EXAM:
CT CHEST WITHOUT CONTRAST
TECHNIQUE: Multidetector CT imaging of the chest was performed following the
standard protocol without IV contrast.

[Series 2: thorax · axial · 0.68mm/px · z∈[-308,-68]mm · 12 of 142 slices shown, 15 images]
[im 11/142  mediastinal]
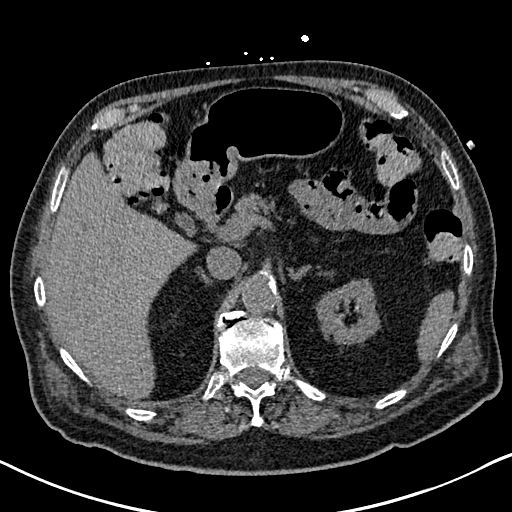
[im 11/142  lung]
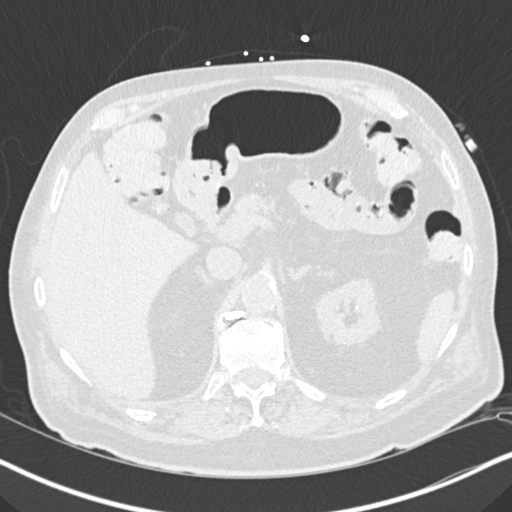
[im 21/142  lung]
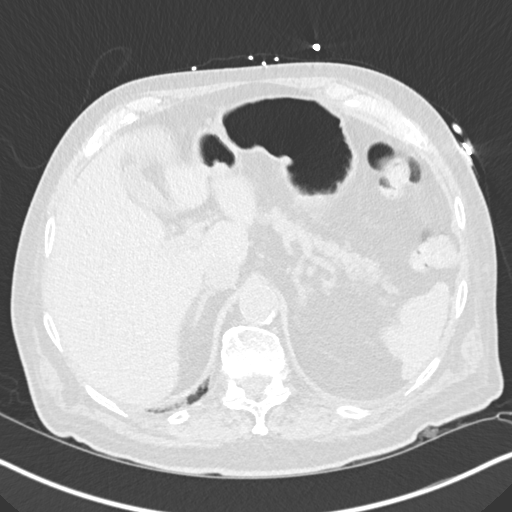
[im 32/142  lung]
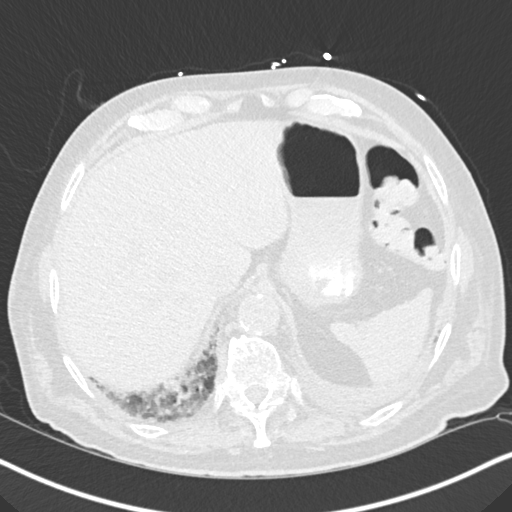
[im 42/142  lung]
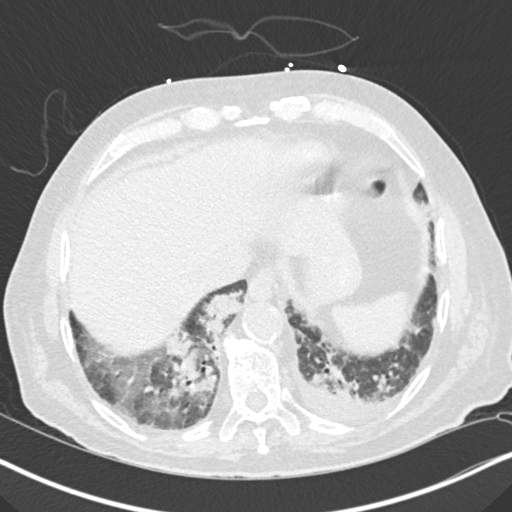
[im 53/142  mediastinal]
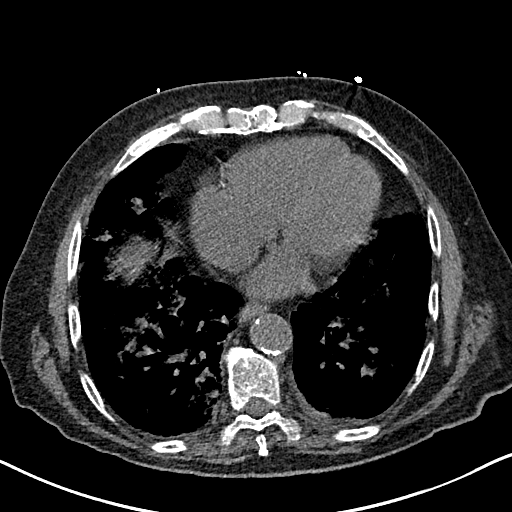
[im 53/142  lung]
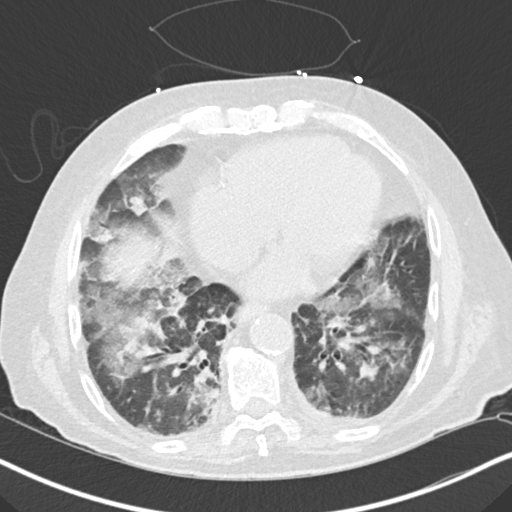
[im 63/142  lung]
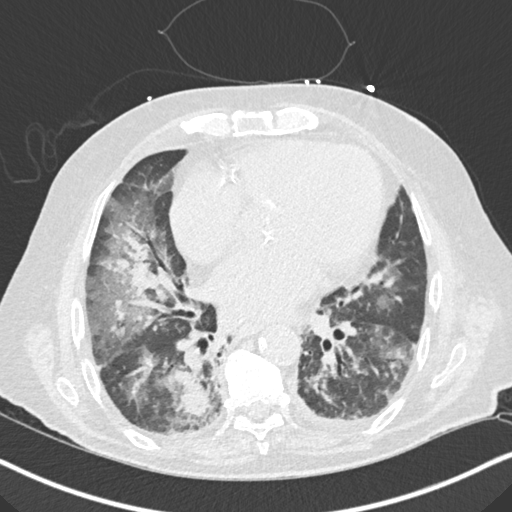
[im 79/142  lung]
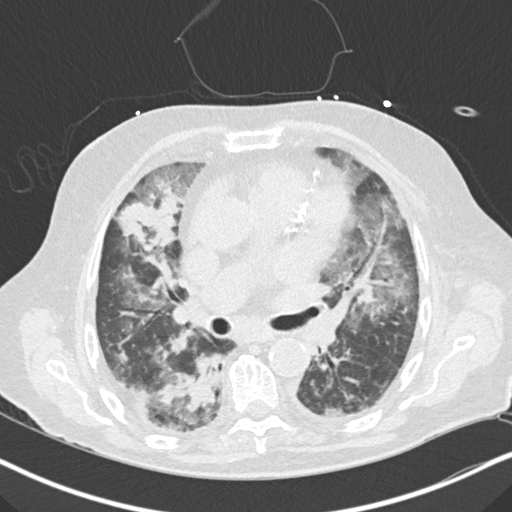
[im 89/142  lung]
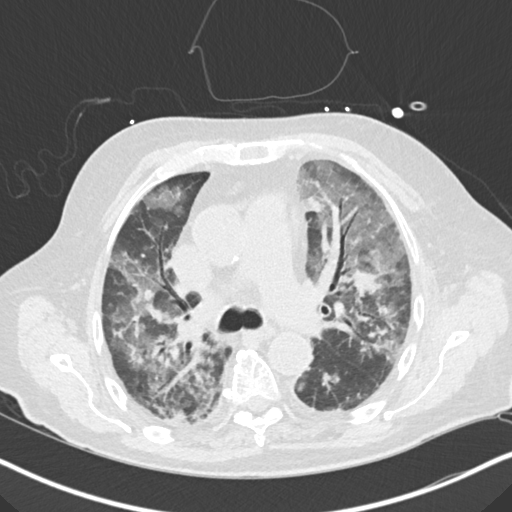
[im 100/142  mediastinal]
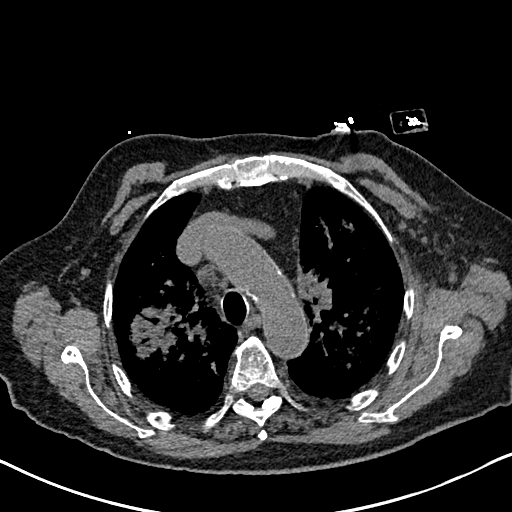
[im 100/142  lung]
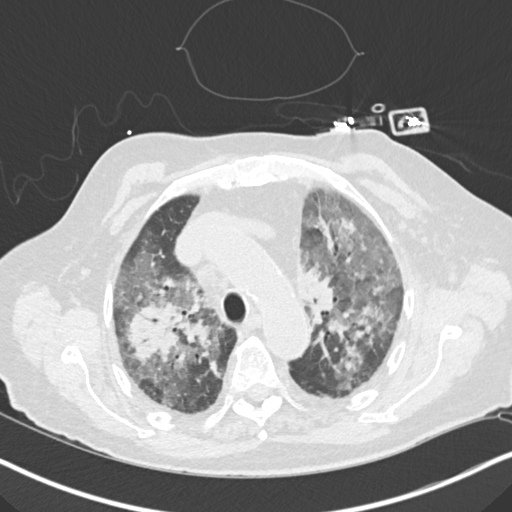
[im 110/142  lung]
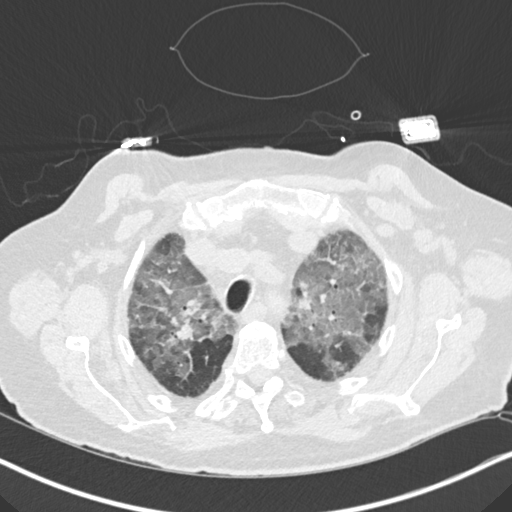
[im 121/142  lung]
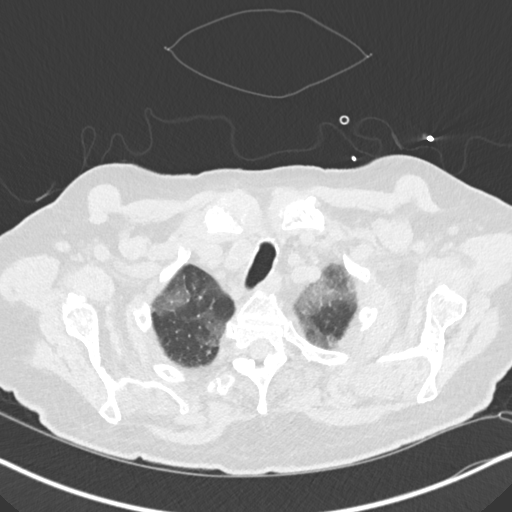
[im 131/142  lung]
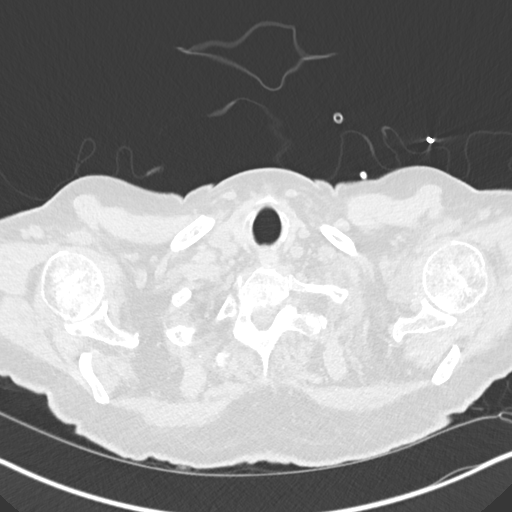

[Series 5: coronal · coronal · 0.62mm/px · 3 of 151 slices shown]
[im 31/151  lung]
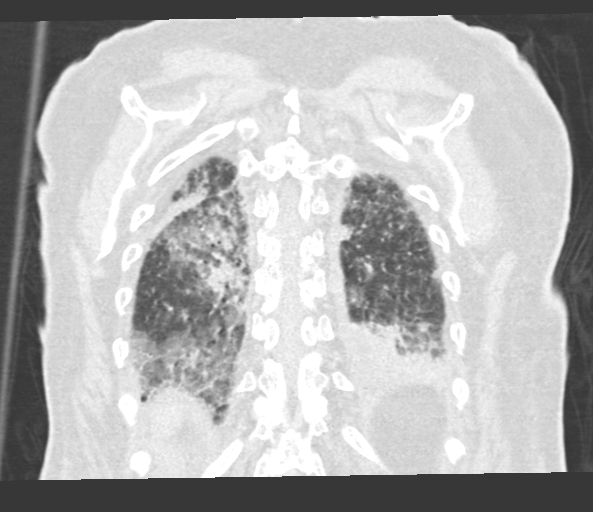
[im 61/151  lung]
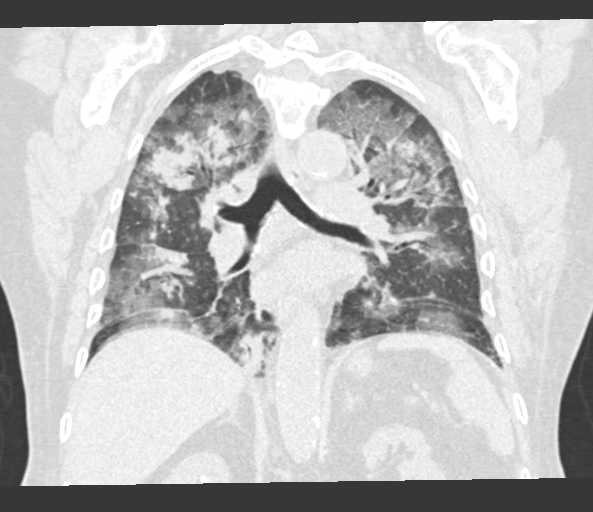
[im 91/151  lung]
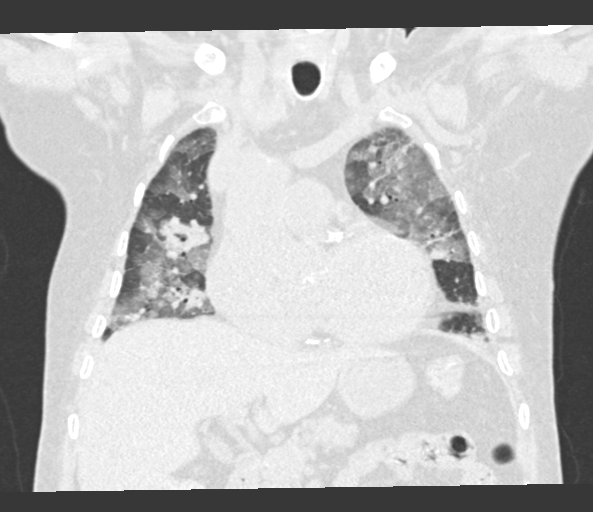

[15 of 36 positions shown; findings below may reference images not displayed]

FINDINGS: Cardiovascular: Aortic atherosclerosis. Moderate cardiomegaly,
without pericardial effusion. Three vessel coronary artery
calcification. Aortic valve calcification is nonspecific in this age
group.

Pulmonary artery enlargement, outflow tract 3.1 cm.

Mediastinum/Nodes: Right paratracheal node maintains its fatty hilum
and measures 1.1 cm on 47/2, similar.

Node within the azygoesophageal recess measures 1.5 cm on 69/2 and
is enlarged from approximately 9 mm on the prior. Hilar regions
poorly evaluated without intravenous contrast.

Lungs/Pleura: Trace left pleural fluid, similar. Right pleural
effusion is nearly completely resolved.

Significantly worsened aeration. Relatively diffuse airspace and
ground-glass opacity. No areas of extraalveolar air to suggest
necrosis. Relative sparing of the left lower lobe.

Upper Abdomen: Normal imaged portions of the liver, spleen, stomach,
pancreas, adrenal glands. Upper pole 5 mm right renal collecting
system calculus. Punctate upper pole left renal collecting system
calculi. Upper pole left renal low-density 11 mm lesion is likely a
cyst.

Musculoskeletal: Diffuse endoscopic skeletal hyperostosis throughout
the spine. Mild osteopenia.
IMPRESSION: 1. Worsened aeration with relatively diffuse interstitial and
airspace disease, most consistent with progressive infection,
including atypical etiologies. Not a typical appearance of [2X]
pneumonia which should be clinically excluded.
2. Persistent tiny left and near complete resolution of right-sided
pleural effusion.
3. Coronary artery atherosclerosis. Aortic Atherosclerosis
([2X]-[2X]).
4. Pulmonary artery enlargement suggests pulmonary arterial
hypertension.
5. Bilateral nephrolithiasis.
6. Mild thoracic adenopathy, favored to be reactive.

## 2020-07-21 MED ORDER — FUROSEMIDE 10 MG/ML IJ SOLN
20.0000 mg | Freq: Two times a day (BID) | INTRAMUSCULAR | Status: DC
  Administered 2020-07-21 – 2020-07-22 (×3): 20 mg via INTRAVENOUS
  Filled 2020-07-21 (×3): qty 2

## 2020-07-21 NOTE — Progress Notes (Signed)
PROGRESS NOTE  Aaron Burton AST:[id number] DOB: 10/05/1928 DOA: 07/14/2020 PCP: Dion Body, MD   LOS: 4 days   Brief Narrative / Interim history: 87 year old male with HTN, HLD, CAD, chronic kidney disease stage IIIb, chronic thrombocytopenia came into the hospital with cough, shortness of breath, fever and chills.  CT angiogram on admission was negative for PE but did show infiltrates in the left lower lobe and bilateral upper lobes.  He was febrile at 102.9, tachycardic, tachypneic.  He was also hypoxic on room air requiring supplemental oxygen.  Subjective / 24h Interval events: He is feeling a little bit better this morning.  Still coughing but less than yesterday.  Not coughing up as much blood  Assessment & Plan: Principal Problem Acute hypoxic respiratory failure due to multifocal community-acquired pneumonia -continue supplemental oxygen to maintain sats above 88%, currently requiring 10 L.  Hemoptysis seems to be stable today -Continue antibiotics with ceftriaxone and doxycycline, plan for total of 7 days on steroids as well due to wheezing -Continue nebulizers -A 2D echo done 6/7 showed EF 60-65%, indeterminate diastolic parameters -He has worsening leukocytosis today up to 35K.  He is requiring more oxygen, will repeat a CT without contrast.  Clinically he does look better  Active Problems Elevated troponin, possible acute on chronic diastolic CHF-up to 989, trending down.  Cardiology consulted, felt to be demand ischemia. -Continue IV Lasix for today  Elevated D-dimer -CT angio negative for PE, lower extremity Doppler without evidence of DVT  Coronary artery disease -no chest pain, continue Plavix, Zocor  Paroxysmal A. fib-noted on telemetry, cardiology started low-dose Eliquis and his Plavix was discontinued.  Continue current regimen, hemoptysis seems to be getting a little bit better  Thrombocytopenia-chronic, monitor  Chronic kidney disease stage IIIb -Baseline  creatinine ranges 1.4-1.8, currently at 1.6, monitor with Lasix  Essential hypertension-continue metoprolol  Scheduled Meds:  apixaban  2.5 mg Oral BID   cholecalciferol  5,000 Units Oral Once per day on Mon Thu   furosemide  20 mg Intravenous BID   gabapentin  600 mg Oral QHS   guaiFENesin  1,200 mg Oral BID   ipratropium-albuterol  3 mL Nebulization TID   methylPREDNISolone (SOLU-MEDROL) injection  40 mg Intravenous Q12H   metoprolol succinate  25 mg Oral Daily   multivitamin with minerals  1 tablet Oral Daily   simvastatin  20 mg Oral QPM   Continuous Infusions:  sodium chloride Stopped (07/20/20 0713)   cefTRIAXone (ROCEPHIN)  IV 1 g (07/20/20 2048)   doxycycline (VIBRAMYCIN) IV 100 mg (07/21/20 1002)   PRN Meds:.sodium chloride, acetaminophen, albuterol, benzonatate, hydrALAZINE, ondansetron (ZOFRAN) IV, senna-docusate  Diet Orders (From admission, onward)     Start     Ordered   08/01/2020 1153  Diet Heart Room service appropriate? Yes; Fluid consistency: Thin  Diet effective now       Question Answer Comment  Room service appropriate? Yes   Fluid consistency: Thin   Na restriction, if any: 2 gm Na      08/10/2020 1152            DVT prophylaxis: apixaban (ELIQUIS) tablet 2.5 mg Start: 07/19/20 2200 apixaban (ELIQUIS) tablet 2.5 mg     Code Status: DNR  Family Communication: no family at bedside   Status is: Inpatient  Remains inpatient appropriate because:Inpatient level of care appropriate due to severity of illness   Dispo: The patient is from: Home  Anticipated d/c is to: Home              Patient currently is not medically stable to d/c.   Difficult to place patient No  Level of care: Progressive Cardiac  Consultants:  Cardiology   Procedures:  2D echo  Microbiology  none  Antimicrobials: Ceftriaxone /doxycycline 6/5, today is day #5   Objective: Vitals:   07/21/20 0331 07/21/20 0345 07/21/20 0804 07/21/20 0829  BP: (!)  145/66  120/70   Pulse: 85  77 84  Resp: 19  (!) 22 20  Temp: 98.2 F (36.8 C)  97.7 F (36.5 C)   TempSrc:   Oral   SpO2: 94% 91% 90% 94%  Weight:      Height:        Intake/Output Summary (Last 24 hours) at 07/21/2020 1027 Last data filed at 07/21/2020 0950 Gross per 24 hour  Intake 1226.49 ml  Output --  Net 1226.49 ml    Filed Weights   07/29/2020 0809 07/20/20 0425  Weight: 72.6 kg 79 kg    Examination:  Constitutional: nad Eyes: no icterus  ENMT: mmm Neck: normal, supple Respiratory: Bilateral rhonchi, improved, no wheezing Cardiovascular: Regular, no appreciable murmurs, no peripheral edema Abdomen: Soft, NT, ND, bowel sounds positive Musculoskeletal: no clubbing / cyanosis.  Skin: No rashes seen Neurologic: No focal deficits  Data Reviewed: I have independently reviewed following labs and imaging studies   CBC: Recent Labs  Lab 07/16/2020 0815 07/18/20 0521 07/19/20 0618 07/20/20 0532 07/21/20 0745  WBC 11.6* 19.8* 19.7* 20.6* 35.0*  NEUTROABS 6.9  --   --   --   --   HGB 10.9* 9.9* 9.7* 9.8* 9.8*  HCT 34.6* 30.3* 29.8* 30.1* 29.7*  MCV 91.3 87.3 87.4 86.5 86.8  PLT 87* 97* 90* 101* 122*    Basic Metabolic Panel: Recent Labs  Lab 07/30/2020 0815 07/18/20 0521 07/19/20 0618 07/20/20 0532 07/21/20 0745  NA 134* 134* 134* 139 140  K 3.5 3.9 4.1 4.3 3.9  CL 101 103 105 108 109  CO2 21* 21* 21* 20* 21*  GLUCOSE 116* 149* 107* 140* 131*  BUN 30* 42* 48* 50* 52*  CREATININE 1.78* 1.82* 1.84* 1.46* 1.61*  CALCIUM 8.6* 8.4* 8.9 9.2 9.3  MG 2.0  --   --   --   --     Liver Function Tests: Recent Labs  Lab 08/08/2020 0815 07/20/20 0532 07/21/20 0745  AST 113* 66* 50*  ALT 33 54* 47*  ALKPHOS 67 66 75  BILITOT 0.9 0.6 0.6  PROT 6.8 6.5 6.7  ALBUMIN 3.2* 2.8* 2.9*    Coagulation Profile: Recent Labs  Lab 07/23/2020 0815  INR 1.3*    HbA1C: No results for input(s): HGBA1C in the last 72 hours.  CBG: No results for input(s): GLUCAP in the  last 168 hours.  Recent Results (from the past 240 hour(s))  Resp Panel by RT-PCR (Flu A&B, Covid) Nasopharyngeal Swab     Status: None   Collection Time: 07/15/20 10:14 AM   Specimen: Nasopharyngeal Swab; Nasopharyngeal(NP) swabs in vial transport medium  Result Value Ref Range Status   SARS Coronavirus 2 by RT PCR NEGATIVE NEGATIVE Final    Comment: (NOTE) SARS-CoV-2 target nucleic acids are NOT DETECTED.  The SARS-CoV-2 RNA is generally detectable in upper respiratory specimens during the acute phase of infection. The lowest concentration of SARS-CoV-2 viral copies this assay can detect is 138 copies/mL. A negative result does not preclude SARS-Cov-2 infection  and should not be used as the sole basis for treatment or other patient management decisions. A negative result may occur with  improper specimen collection/handling, submission of specimen other than nasopharyngeal swab, presence of viral mutation(s) within the areas targeted by this assay, and inadequate number of viral copies(<138 copies/mL). A negative result must be combined with clinical observations, patient history, and epidemiological information. The expected result is Negative.  Fact Sheet for Patients:  EntrepreneurPulse.com.au  Fact Sheet for Healthcare Providers:  IncredibleEmployment.be  This test is no t yet approved or cleared by the [place] FDA and  has been authorized for detection and/or diagnosis of SARS-CoV-2 by FDA under an Emergency Use Authorization (EUA). This EUA will remain  in effect (meaning this test can be used) for the duration of the COVID-19 declaration under Section 564(b)(1) of the Act, 21 U.S.C.section 360bbb-3(b)(1), unless the authorization is terminated  or revoked sooner.       Influenza A by PCR NEGATIVE NEGATIVE Final   Influenza B by PCR NEGATIVE NEGATIVE Final    Comment: (NOTE) The Xpert Xpress SARS-CoV-2/FLU/RSV plus assay is  intended as an aid in the diagnosis of influenza from Nasopharyngeal swab specimens and should not be used as a sole basis for treatment. Nasal washings and aspirates are unacceptable for Xpert Xpress SARS-CoV-2/FLU/RSV testing.  Fact Sheet for Patients: EntrepreneurPulse.com.au  Fact Sheet for Healthcare Providers: IncredibleEmployment.be  This test is not yet approved or cleared by the [place] FDA and has been authorized for detection and/or diagnosis of SARS-CoV-2 by FDA under an Emergency Use Authorization (EUA). This EUA will remain in effect (meaning this test can be used) for the duration of the COVID-19 declaration under Section 564(b)(1) of the Act, 21 U.S.C. section 360bbb-3(b)(1), unless the authorization is terminated or revoked.  Performed at [facility], [address], [place], [place] [address]   Blood culture (routine single)     Status: None (Preliminary result)   Collection Time: 07/30/2020  8:15 AM   Specimen: BLOOD  Result Value Ref Range Status   Specimen Description BLOOD RIGHT ANTECUBITAL  Final   Special Requests   Final    BOTTLES DRAWN AEROBIC AND ANAEROBIC Blood Culture adequate volume   Culture   Final    NO GROWTH 4 DAYS Performed at [facility], [address], [place], [place] [address]    Report Status PENDING  Incomplete  Urine culture     Status: None   Collection Time: 08/03/2020  8:15 AM   Specimen: In/Out Cath Urine  Result Value Ref Range Status   Specimen Description   Final    IN/OUT CATH URINE Performed at [facility], [address] [place], [place] [address]    Special Requests   Final    NONE Performed at [facility], [address] [place], [place] [address]    Culture   Final    NO GROWTH Performed at [facility], [place] [address], [place],  [address]    Report Status 07/18/2020 FINAL  Final  Resp Panel by RT-PCR (Flu A&B,  Covid)     Status: None   Collection Time: 07/30/2020  8:15 AM   Specimen: Nasopharyngeal(NP) swabs in vial transport medium  Result Value Ref Range Status   SARS Coronavirus 2 by RT PCR NEGATIVE NEGATIVE Final    Comment: (NOTE) SARS-CoV-2 target nucleic acids are NOT DETECTED.  The SARS-CoV-2 RNA is generally detectable in upper respiratory specimens during  the acute phase of infection. The lowest concentration of SARS-CoV-2 viral copies this assay can detect is 138 copies/mL. A negative result does not preclude SARS-Cov-2 infection and should not be used as the sole basis for treatment or other patient management decisions. A negative result may occur with  improper specimen collection/handling, submission of specimen other than nasopharyngeal swab, presence of viral mutation(s) within the areas targeted by this assay, and inadequate number of viral copies(<138 copies/mL). A negative result must be combined with clinical observations, patient history, and epidemiological information. The expected result is Negative.  Fact Sheet for Patients:  EntrepreneurPulse.com.au  Fact Sheet for Healthcare Providers:  IncredibleEmployment.be  This test is no t yet approved or cleared by the [place] FDA and  has been authorized for detection and/or diagnosis of SARS-CoV-2 by FDA under an Emergency Use Authorization (EUA). This EUA will remain  in effect (meaning this test can be used) for the duration of the COVID-19 declaration under Section 564(b)(1) of the Act, 21 U.S.C.section 360bbb-3(b)(1), unless the authorization is terminated  or revoked sooner.       Influenza A by PCR NEGATIVE NEGATIVE Final   Influenza B by PCR NEGATIVE NEGATIVE Final    Comment: (NOTE) The Xpert Xpress SARS-CoV-2/FLU/RSV plus assay is intended as an aid in the diagnosis of influenza from Nasopharyngeal swab specimens and should not be used as a sole basis for  treatment. Nasal washings and aspirates are unacceptable for Xpert Xpress SARS-CoV-2/FLU/RSV testing.  Fact Sheet for Patients: EntrepreneurPulse.com.au  Fact Sheet for Healthcare Providers: IncredibleEmployment.be  This test is not yet approved or cleared by the [place] FDA and has been authorized for detection and/or diagnosis of SARS-CoV-2 by FDA under an Emergency Use Authorization (EUA). This EUA will remain in effect (meaning this test can be used) for the duration of the COVID-19 declaration under Section 564(b)(1) of the Act, 21 U.S.C. section 360bbb-3(b)(1), unless the authorization is terminated or revoked.  Performed at [facility], [address], [place], [place] [address]      Radiology Studies: No results found.   Marzetta Board, MD, PhD [facility]  Between 7 am - 7 pm I am available, please contact me via Amion (for emergencies) or Securechat (non urgent messages)  Between 7 pm - 7 am I am not available, please contact night coverage MD/APP via Amion

## 2020-07-21 NOTE — Progress Notes (Signed)
[facility] Cardiology    SUBJECTIVE: The patient reports feeling better this morning. He states that the hemoptysis has become less, denies frank red blood. He reports that his breathing has improved and is eager to go home.   Vitals:   07/20/20 2118 07/21/20 0331 07/21/20 0345 07/21/20 0804  BP:  (!) 145/66  120/70  Pulse:  85  77  Resp:  19  (!) 22  Temp:  98.2 F (36.8 C)  97.7 F (36.5 C)  TempSrc:    Oral  SpO2: 93% 94% 91% 90%  Weight:      Height:         Intake/Output Summary (Last 24 hours) at 07/21/2020 7169 Last data filed at 07/20/2020 1700 Gross per 24 hour  Intake 986.49 ml  Output --  Net 986.49 ml      PHYSICAL EXAM  General: Well developed, well nourished, elderly gentleman, sitting up in bed eating breakfast, in no acute distress HEENT:  Normocephalic and atramatic Neck:   No JVD. Lungs: normal effort of breathing on supplemental oxygen, coarse rhonchi throughout and crackles, on supplemental oxygen via [place]. Heart: irregularly irregular without gallops or murmurs. Abdomen: no distention Extremities: No clubbing, cyanosis or edema.   Neuro: Alert and oriented X 3. Psych:  Good affect, responds appropriately  LABS: Basic Metabolic Panel: Recent Labs    07/19/20 0618 07/20/20 0532  NA 134* 139  K 4.1 4.3  CL 105 108  CO2 21* 20*  GLUCOSE 107* 140*  BUN 48* 50*  CREATININE 1.84* 1.46*  CALCIUM 8.9 9.2   Liver Function Tests: Recent Labs    07/20/20 0532  AST 66*  ALT 54*  ALKPHOS 66  BILITOT 0.6  PROT 6.5  ALBUMIN 2.8*   No results for input(s): LIPASE, AMYLASE in the last 72 hours. CBC: Recent Labs    07/19/20 0618 07/20/20 0532  WBC 19.7* 20.6*  HGB 9.7* 9.8*  HCT 29.8* 30.1*  MCV 87.4 86.5  PLT 90* 101*   Cardiac Enzymes: No results for input(s): CKTOTAL, CKMB, CKMBINDEX, TROPONINI in the last 72 hours. BNP: Invalid input(s): POCBNP D-Dimer: No results for input(s): DDIMER in the last 72 hours. Hemoglobin A1C: No results for  input(s): HGBA1C in the last 72 hours. Fasting Lipid Panel: No results for input(s): CHOL, HDL, LDLCALC, TRIG, CHOLHDL, LDLDIRECT in the last 72 hours. Thyroid Function Tests: No results for input(s): TSH, T4TOTAL, T3FREE, THYROIDAB in the last 72 hours.  Invalid input(s): FREET3 Anemia Panel: No results for input(s): VITAMINB12, FOLATE, FERRITIN, TIBC, IRON, RETICCTPCT in the last 72 hours.  ECHOCARDIOGRAM COMPLETE  Result Date: 07/19/2020    ECHOCARDIOGRAM REPORT   Patient Name:   BILLEY WOJCIAK Date of Exam: 07/19/2020 Medical Rec #:  [id number]     Height:       66.0 in Accession #:    [id number]    Weight:       160.0 lb Date of Birth:  [birth year]     BSA:          1.819 m Patient Age:    87 years      BP:           100/63 mmHg Patient Gender: M             HR:           63 bpm. Exam Location:  [facility] Procedure: 2D Echo, Cardiac Doppler and Color Doppler Indications:     Elevated Troponin  History:  Patient has no prior history of Echocardiogram examinations.                  CAD; Risk Factors:Hypertension. Pneumonia.  Sonographer:     Sherrie Sport RDCS (AE) Referring Phys:  [place] Diagnosing Phys: Isaias Cowman MD IMPRESSIONS  1. Left ventricular ejection fraction, by estimation, is 60 to 65%. The left ventricle has normal function. The left ventricle has no regional wall motion abnormalities. Left ventricular diastolic parameters are indeterminate.  2. Right ventricular systolic function is normal. The right ventricular size is normal.  3. The mitral valve is normal in structure. Moderate mitral valve regurgitation. No evidence of mitral stenosis.  4. Tricuspid valve regurgitation is mild to moderate.  5. The aortic valve is normal in structure. Aortic valve regurgitation is not visualized. Mild aortic valve sclerosis is present, with no evidence of aortic valve stenosis.  6. The inferior vena cava is normal in size with greater than 50% respiratory variability, suggesting right  atrial pressure of 3 mmHg. FINDINGS  Left Ventricle: Left ventricular ejection fraction, by estimation, is 60 to 65%. The left ventricle has normal function. The left ventricle has no regional wall motion abnormalities. The left ventricular internal cavity size was normal in size. There is  no left ventricular hypertrophy. Left ventricular diastolic parameters are indeterminate. Right Ventricle: The right ventricular size is normal. No increase in right ventricular wall thickness. Right ventricular systolic function is normal. Left Atrium: Left atrial size was normal in size. Right Atrium: Right atrial size was normal in size. Pericardium: There is no evidence of pericardial effusion. Mitral Valve: The mitral valve is normal in structure. Moderate mitral valve regurgitation. No evidence of mitral valve stenosis. Tricuspid Valve: The tricuspid valve is normal in structure. Tricuspid valve regurgitation is mild to moderate. No evidence of tricuspid stenosis. Aortic Valve: The aortic valve is normal in structure. Aortic valve regurgitation is not visualized. Mild aortic valve sclerosis is present, with no evidence of aortic valve stenosis. Aortic valve mean gradient measures 6.7 mmHg. Aortic valve peak gradient measures 12.2 mmHg. Aortic valve area, by VTI measures 2.92 cm. Pulmonic Valve: The pulmonic valve was normal in structure. Pulmonic valve regurgitation is not visualized. No evidence of pulmonic stenosis. Aorta: The aortic root is normal in size and structure. Venous: The inferior vena cava is normal in size with greater than 50% respiratory variability, suggesting right atrial pressure of 3 mmHg. IAS/Shunts: No atrial level shunt detected by color flow Doppler.  LEFT VENTRICLE PLAX 2D LVIDd:         3.42 cm LVIDs:         2.43 cm LV PW:         1.72 cm LV IVS:        0.80 cm LVOT diam:     2.00 cm LV SV:         79 LV SV Index:   44 LVOT Area:     3.14 cm  RIGHT VENTRICLE RV Basal diam:  2.88 cm RV S prime:      11.60 cm/s TAPSE (M-mode): 3.7 cm LEFT ATRIUM           Index       RIGHT ATRIUM           Index LA diam:      4.60 cm 2.53 cm/m  RA Area:     13.80 cm LA Vol (A4C): 70.0 ml 38.49 ml/m RA Volume:   29.40 ml  16.16 ml/m  AORTIC VALVE                    PULMONIC VALVE AV Area (Vmax):    2.94 cm     PV Vmax:        0.96 m/s AV Area (Vmean):   2.68 cm     PV Peak grad:   3.7 mmHg AV Area (VTI):     2.92 cm     RVOT Peak grad: 3 mmHg AV Vmax:           175.00 cm/s AV Vmean:          116.967 cm/s AV VTI:            0.271 m AV Peak Grad:      12.2 mmHg AV Mean Grad:      6.7 mmHg LVOT Vmax:         164.00 cm/s LVOT Vmean:        99.800 cm/s LVOT VTI:          0.252 m LVOT/AV VTI ratio: 0.93  AORTA Ao Root diam: 3.30 cm MITRAL VALVE                TRICUSPID VALVE MV Area (PHT): 3.89 cm     TR Peak grad:   46.8 mmHg MV Decel Time: 195 msec     TR Vmax:        342.00 cm/s MV E velocity: 128.00 cm/s                             SHUNTS                             Systemic VTI:  0.25 m                             Systemic Diam: 2.00 cm Isaias Cowman MD Electronically signed by Isaias Cowman MD Signature Date/Time: 07/19/2020/4:14:01 PM    Final      Echo LVEF 60-65%, no RWMA, moderate MR, mild to moderate TR  TELEMETRY: atrial fibrillation, 80s  ASSESSMENT AND PLAN:  Principal Problem:   CAP (community acquired pneumonia) Active Problems:   Coronary artery disease   Hypercholesterolemia   Hypertension   Elevated troponin   Sepsis (HCC)   Thrombocytopenia (HCC)   CKD (chronic kidney disease), stage IIIb   Acute respiratory failure with hypoxia ([place])    1.  Elevated troponin ([phone], [phone], 457), in the setting of multifocal pneumonia, in the absence of chest pain, with nondiagnostic ECG, likely due to demand supply ischemia, not due to acute coronary syndrome. Echocardiogram shows normal left ventricular function with no regional wall motion abnormalities. ( I21.A1 ) 2.  Multifocal  community-acquired pneumonia on broad-spectrum antibiotics 3.  Chronic kidney disease stage IIIb, BUN and creatinine 50 and 1.46, respectively 4.  Thrombocytopenia, platelet count 90 5.  Retinal artery occlusion in 04/2020, switched to Plavix, continued statin, carotid ultrasound with mild to moderate plaque 6. Paroxysmal atrial fibrillation, noted on telemetry this admission, chads vasc score of 6. Rate adequately controlled today. 7. Hypertension, on amlodipine at home, blood pressure mildly elevated this morning in setting of steroid use. 8. Blood tinged sputum, patient reports it is improving, has less hemoptysis, and it is fainter in color. No frank red blood 9. Acute  diastolic CHF with small pleural effusions and elevated BNP, clinically improving after IV Lasix  Recommendations: Continue low dose Eliquis BID for stroke prevention and in light improving hemoptysis Continue metoprolol succinate 25 mg BID for rate control Continue IV Lasix 20 mg BID today and plan to transition to oral Lasix 20 mg by discharge Consider ambulating today/tomorrow to assess oxygen requirement as patient is not on supplemental oxygen at home Consider discharge tomorrow from a cardiovascular stand point and follow up with Dr. Nehemiah Massed in 1 week at which time Holter may be placed. Recommend repeating metabolic panel and CBC as outpatient in 1-2 weeks.  Sign off for now; please call/[facility]iku with any questions. The patient was also seen and examined by Dr. Saralyn Pilar and the plan was made in collaboration with him.   Clabe Seal, PA-C 07/21/2020 8:12 AM

## 2020-07-22 ENCOUNTER — Inpatient Hospital Stay: Payer: PPO

## 2020-07-22 DIAGNOSIS — J189 Pneumonia, unspecified organism: Secondary | ICD-10-CM

## 2020-07-22 DIAGNOSIS — J9601 Acute respiratory failure with hypoxia: Secondary | ICD-10-CM

## 2020-07-22 LAB — BASIC METABOLIC PANEL
Anion gap: 10 (ref 5–15)
BUN: 57 mg/dL — ABNORMAL HIGH (ref 8–23)
CO2: 23 mmol/L (ref 22–32)
Calcium: 9.2 mg/dL (ref 8.9–10.3)
Chloride: 109 mmol/L (ref 98–111)
Creatinine, Ser: 1.51 mg/dL — ABNORMAL HIGH (ref 0.61–1.24)
GFR, Estimated: 44 mL/min — ABNORMAL LOW (ref >60)
Glucose, Bld: 135 mg/dL — ABNORMAL HIGH (ref 70–99)
Potassium: 4.4 mmol/L (ref 3.5–5.1)
Sodium: 142 mmol/L (ref 135–145)

## 2020-07-22 LAB — LACTATE DEHYDROGENASE: LDH: 384 U/L — ABNORMAL HIGH (ref 98–192)

## 2020-07-22 LAB — CBC
HCT: 29.5 % — ABNORMAL LOW (ref 39.0–52.0)
Hemoglobin: 9.6 g/dL — ABNORMAL LOW (ref 13.0–17.0)
MCH: 28.2 pg (ref 26.0–34.0)
MCHC: 32.5 g/dL (ref 30.0–36.0)
MCV: 86.5 fL (ref 80.0–100.0)
Platelets: 123 10*3/uL — ABNORMAL LOW (ref 150–400)
RBC: 3.41 MIL/uL — ABNORMAL LOW (ref 4.22–5.81)
RDW: 19.3 % — ABNORMAL HIGH (ref 11.5–15.5)
WBC: 38.6 10*3/uL — ABNORMAL HIGH (ref 4.0–10.5)
nRBC: 0.1 % (ref 0.0–0.2)

## 2020-07-22 LAB — RESPIRATORY PANEL BY PCR

## 2020-07-22 LAB — EXPECTORATED SPUTUM ASSESSMENT W GRAM STAIN, RFLX TO RESP C

## 2020-07-22 LAB — STREP PNEUMONIAE URINARY ANTIGEN: Strep Pneumo Urinary Antigen: NEGATIVE

## 2020-07-22 LAB — BRAIN NATRIURETIC PEPTIDE: B Natriuretic Peptide: 798 pg/mL — ABNORMAL HIGH (ref 0.0–100.0)

## 2020-07-22 LAB — MRSA PCR SCREENING: MRSA by PCR: NEGATIVE

## 2020-07-22 LAB — PROCALCITONIN: Procalcitonin: 0.1 ng/mL

## 2020-07-22 IMAGING — DX DG CHEST 1V PORT
1 series · 1 of 1 positions shown · non-contrast
Comparison: [DATE]

CLINICAL DATA: Acute respiratory failure with hypoxia

EXAM:
PORTABLE CHEST 1 VIEW

[chest ap]
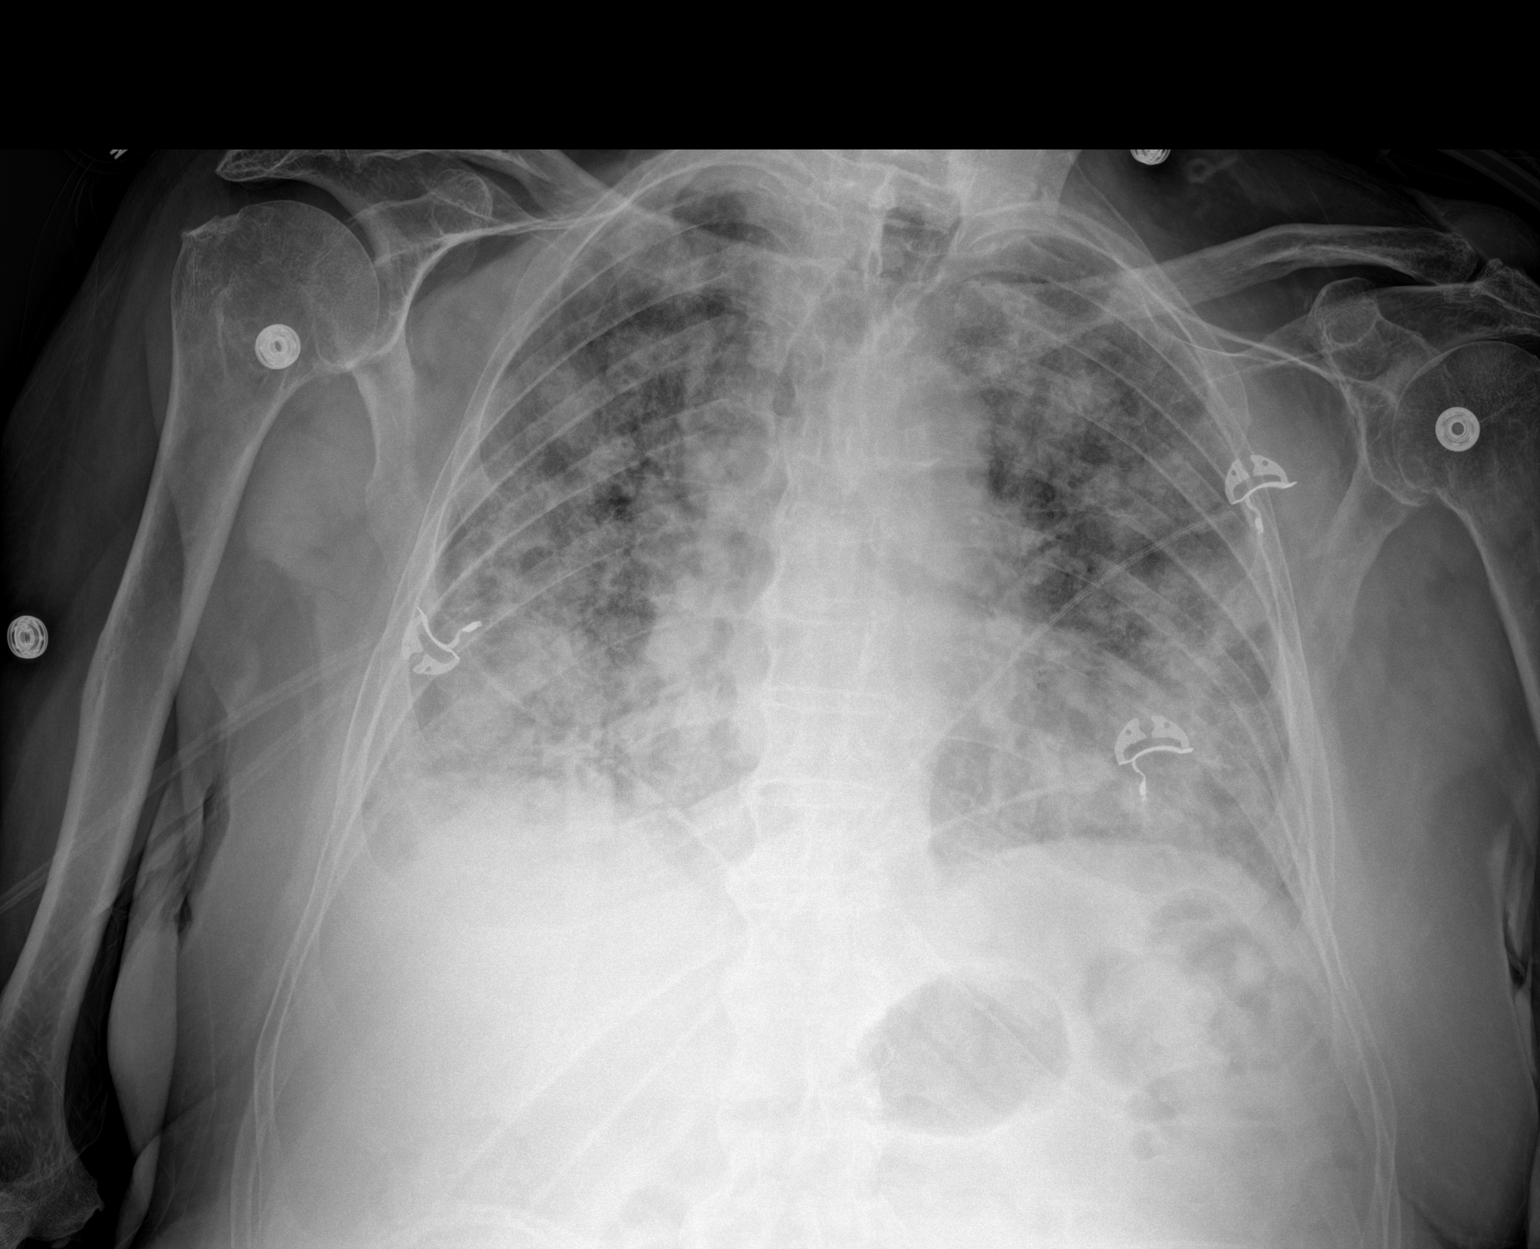

[1 of 1 positions shown; findings below may reference images not displayed]

FINDINGS: Diffuse bilateral airspace disease, worsening since prior study.
Cardiomegaly. No visible effusions or pneumothorax. No acute bony
abnormality.
IMPRESSION: Worsening bilateral airspace disease could reflect edema or
infection.

## 2020-07-22 MED ORDER — SODIUM CHLORIDE 0.9 % IV SOLN
500.0000 mg | INTRAVENOUS | Status: DC
  Administered 2020-07-22: 500 mg via INTRAVENOUS
  Filled 2020-07-22 (×3): qty 500

## 2020-07-22 MED ORDER — FUROSEMIDE 10 MG/ML IJ SOLN
40.0000 mg | Freq: Two times a day (BID) | INTRAMUSCULAR | Status: DC
  Administered 2020-07-23 – 2020-07-24 (×3): 40 mg via INTRAVENOUS
  Filled 2020-07-22 (×4): qty 4

## 2020-07-22 MED ORDER — SODIUM CHLORIDE 0.9 % IV SOLN
2.0000 g | INTRAVENOUS | Status: DC
  Filled 2020-07-22: qty 20

## 2020-07-22 NOTE — Progress Notes (Signed)
Able to provide IV access with # 24 Rt hand - Son at bedside & aware that his father has very limited IV access. RN made aware, suggested MD be notified & that patient will need something more substantial to complete antibiotic therapy.

## 2020-07-22 NOTE — Progress Notes (Signed)
Ultrasound attempts x 2 of bilat upper extremities - no access achieved for IV

## 2020-07-22 NOTE — Progress Notes (Signed)
Assisting fellow therapist. Placed patient on bipap after desaturation episode after going to the bathroom. Patient is tachypneic but tolerating current settings at this time. No further changes

## 2020-07-22 NOTE — Progress Notes (Signed)
Changed bipap settings from 14/8, 50% to 10/5, 100%, due to patient not tolerating higher pressures.  SPO2 on new settings: 98-100%. Nurse aware of changes.

## 2020-07-22 NOTE — Care Management Important Message (Signed)
Important Message  Patient Details  Name: Aaron Burton MRN: [id number] Date of Birth: [birth year]   Medicare Important Message Given:  Yes     Dannette Barbara 07/22/2020, 2:59 PM

## 2020-07-22 NOTE — TOC Initial Note (Signed)
Transition of Care [facility]) - Initial/Assessment Note    Patient Details  Name: Aaron Burton MRN: [id number] Date of Birth: Jan 12, 1929  Transition of Care [facility]) CM/SW Contact:    Alberteen Sam, LCSW Phone Number: 07/22/2020, 10:47 AM  Clinical Narrative:                  CSW met with patient and family at bedside, daughter Romie Minus and Ophelia Charter. Discussed recommendation for home health services at time of discharge, all in agreement for CSW to send referrals for acceptance from [facility] PT, OT, RN and aide. Pending response on home health referrals at this time.   Agreeable for rolling walker, reports already having 3in1 in home. Agreeable for O2 to be set up with [employer].   Rolling walker to be delivered to room by Suanne Marker with [employer], per MD will hold off on O2 orders as currently high L of O2 hopeful to wean down more, patient not medically stable to be close to dc at this time.   When patient is medically stable, if should need O2 at discharge, [employer] has been informed they should reach out to [place] at [phone] or [place] at [phone] to set up home oxygen. Romie Minus or [facility] would be able to transport patient home at time of discharge as well per their report.   No other questions or concerns at this time, TOC team to continue to follow for discharge planning needs.    Expected Discharge Plan: [place] Barriers to Discharge: Continued Medical Work up   Patient Goals and CMS Choice Patient states their goals for this hospitalization and ongoing recovery are:: to go home CMS Medicare.gov Compare Post Acute Care list provided to:: Patient Choice offered to / list presented to : Patient, Adult Children  Expected Discharge Plan and Services Expected Discharge Plan: [place]       Living arrangements for the past 2 months: Single Family Home                 DME Arranged: Oxygen, Walker rolling DME Agency: [employer] Date DME Agency Contacted:  07/22/20 Time DME Agency Contacted: 39 Representative spoke with at DME Agency: Suanne Marker HH Arranged: PT, OT, RN, Nurse's Aide [place] Agency:  (TBD) Date HH Agency Contacted: 07/22/20 Time HH Agency Contacted: 38    Prior Living Arrangements/Services Living arrangements for the past 2 months: [place] with:: Adult Children   Do you feel safe going back to the place where you live?: Yes               Activities of Daily Living Home Assistive Devices/Equipment: None ADL Screening (condition at time of admission) Patient's cognitive ability adequate to safely complete daily activities?: Yes Is the patient deaf or have difficulty hearing?: Yes Does the patient have difficulty seeing, even when wearing glasses/contacts?: No Does the patient have difficulty concentrating, remembering, or making decisions?: No Patient able to express need for assistance with ADLs?: Yes Does the patient have difficulty dressing or bathing?: No Independently performs ADLs?: Yes (appropriate for developmental age) Does the patient have difficulty walking or climbing stairs?: No Weakness of Legs: None Weakness of Arms/Hands: None  Permission Sought/Granted                  Emotional Assessment Appearance:: Appears stated age Attitude/Demeanor/Rapport: Gracious Affect (typically observed): Calm Orientation: : Oriented to Self, Oriented to Place, Oriented to  Time, Oriented to Situation Alcohol /  Substance Use: Not Applicable Psych Involvement: No (comment)  Admission diagnosis:  Positive D-dimer [R79.89] CAP (community acquired pneumonia) [J18.9] NSTEMI (non-ST elevated myocardial infarction) ([place]) [I21.4] Acute respiratory failure with hypoxia ([place]) [J96.01] Pneumonia due to infectious organism, unspecified laterality, unspecified part of lung [J18.9] Sepsis, due to unspecified organism, unspecified whether acute organ dysfunction present [facility]) [A41.9] Patient Active Problem  List   Diagnosis Date Noted   CAP (community acquired pneumonia) 07/14/2020   Coronary artery disease    Hypercholesterolemia    Hypertension    Elevated troponin    Sepsis ([place])    Thrombocytopenia ([place])    CKD (chronic kidney disease), stage IIIb    Acute respiratory failure with hypoxia ([place])    Angioedema 07/09/2018   PCP:  Dion Body, MD Pharmacy:   [facility], [place] [address] Phone: [phone] Fax: [phone]     Social Determinants of Health (SDOH) Interventions    Readmission Risk Interventions No flowsheet data found.

## 2020-07-22 NOTE — Progress Notes (Signed)
PROGRESS NOTE  Aaron Burton QIW:[id number] DOB: 26-Jul-1928 DOA: 08/03/2020 PCP: Dion Body, MD   LOS: 5 days   Brief Narrative / Interim history: 87 year old male with HTN, HLD, CAD, chronic kidney disease stage IIIb, chronic thrombocytopenia came into the hospital with cough, shortness of breath, fever and chills.  CT angiogram on admission was negative for PE but did show infiltrates in the left lower lobe and bilateral upper lobes.  He was febrile at 102.9, tachycardic, tachypneic.  He was also hypoxic on room air requiring supplemental oxygen.  Subjective / 24h Interval events: Feeling very winded with minimal activity   Assessment & Plan: Principal Problem Acute hypoxic respiratory failure due to multifocal community-acquired pneumonia -continue supplemental oxygen to maintain sats above 88%, currently requiring 12 L.  Hemoptysis seems to be stable today but unfortunately WBC continues to get worse. Will get ID input. CT scan yesterday with worsening infiltrates -Continue nebulizers. Was on steroids on admission for wheezing, unlikely to cause this degree of WBC elevation. Dc steroids -A 2D echo done 6/7 showed EF 60-65%, indeterminate diastolic parameters  Active Problems Elevated troponin, possible acute on chronic diastolic CHF-up to 417, trending down.  Cardiology consulted, felt to be demand ischemia. -has been placed on IV Lasix, continue. Renal function is stable. He appears net positive however UOP not fully charted. Will repeat his weight and get a BNP  Elevated D-dimer -CT angio negative for PE, lower extremity Doppler without evidence of DVT  Coronary artery disease -no chest pain, continue Plavix, Zocor  Paroxysmal A. fib-noted on telemetry, cardiology started low-dose Eliquis and his Plavix was discontinued.  Continue current regimen, hemoptysis seems to be getting a little bit better  Thrombocytopenia-chronic, monitor  Chronic kidney disease stage IIIb  -Baseline creatinine ranges 1.4-1.8, currently at 1.6, monitor with Lasix  Essential hypertension-continue metoprolol  Scheduled Meds:  apixaban  2.5 mg Oral BID   cholecalciferol  5,000 Units Oral Once per day on Mon Thu   furosemide  20 mg Intravenous BID   gabapentin  600 mg Oral QHS   guaiFENesin  1,200 mg Oral BID   ipratropium-albuterol  3 mL Nebulization TID   metoprolol succinate  25 mg Oral Daily   multivitamin with minerals  1 tablet Oral Daily   simvastatin  20 mg Oral QPM   Continuous Infusions:  sodium chloride Stopped (07/20/20 0713)   cefTRIAXone (ROCEPHIN)  IV 1 g (07/21/20 2031)   doxycycline (VIBRAMYCIN) IV 100 mg (07/22/20 0948)   PRN Meds:.sodium chloride, acetaminophen, albuterol, benzonatate, hydrALAZINE, ondansetron (ZOFRAN) IV, senna-docusate  Diet Orders (From admission, onward)     Start     Ordered   07/26/2020 1153  Diet Heart Room service appropriate? Yes; Fluid consistency: Thin  Diet effective now       Question Answer Comment  Room service appropriate? Yes   Fluid consistency: Thin   Na restriction, if any: 2 gm Na      07/14/2020 1152            DVT prophylaxis: apixaban (ELIQUIS) tablet 2.5 mg Start: 07/19/20 2200 apixaban (ELIQUIS) tablet 2.5 mg     Code Status: DNR  Family Communication: no family at bedside   Status is: Inpatient  Remains inpatient appropriate because:Inpatient level of care appropriate due to severity of illness   Dispo: The patient is from: Home              Anticipated d/c is to: Home  Patient currently is not medically stable to d/c.   Difficult to place patient No  Level of care: Progressive Cardiac  Consultants:  Cardiology  ID  Procedures:  2D echo  Microbiology  none  Antimicrobials: Ceftriaxone /doxycycline 6/5, today is day #5   Objective: Vitals:   07/22/20 0725 07/22/20 0743 07/22/20 0930 07/22/20 1132  BP: 123/79   119/70  Pulse: 91   83  Resp: (!) 25   (!) 25   Temp: 97.8 F (36.6 C)   98.2 F (36.8 C)  TempSrc: Oral   Oral  SpO2: 91% 92% 98% 93%  Weight:      Height:        Intake/Output Summary (Last 24 hours) at 07/22/2020 1208 Last data filed at 07/22/2020 1101 Gross per 24 hour  Intake 480 ml  Output 1100 ml  Net -620 ml    Filed Weights   08/07/2020 0809 07/20/20 0425  Weight: 72.6 kg 79 kg    Examination:  Constitutional: nad, in bed Eyes: anicteric ENMT: mmm Neck: normal, supple Respiratory: bilateral rhonchi, no wheezing  Cardiovascular: rrr, trace edema Abdomen: soft, nt, nd, bs+ Musculoskeletal: no clubbing / cyanosis.  Skin: no new rashes Neurologic: no focal deficits, equal strength  Data Reviewed: I have independently reviewed following labs and imaging studies   CBC: Recent Labs  Lab 08/09/2020 0815 07/18/20 0521 07/19/20 0618 07/20/20 0532 07/21/20 0745 07/22/20 0547  WBC 11.6* 19.8* 19.7* 20.6* 35.0* 38.6*  NEUTROABS 6.9  --   --   --   --   --   HGB 10.9* 9.9* 9.7* 9.8* 9.8* 9.6*  HCT 34.6* 30.3* 29.8* 30.1* 29.7* 29.5*  MCV 91.3 87.3 87.4 86.5 86.8 86.5  PLT 87* 97* 90* 101* 122* 123*    Basic Metabolic Panel: Recent Labs  Lab 08/05/2020 0815 07/18/20 0521 07/19/20 0618 07/20/20 0532 07/21/20 0745 07/22/20 0547  NA 134* 134* 134* 139 140 142  K 3.5 3.9 4.1 4.3 3.9 4.4  CL 101 103 105 108 109 109  CO2 21* 21* 21* 20* 21* 23  GLUCOSE 116* 149* 107* 140* 131* 135*  BUN 30* 42* 48* 50* 52* 57*  CREATININE 1.78* 1.82* 1.84* 1.46* 1.61* 1.51*  CALCIUM 8.6* 8.4* 8.9 9.2 9.3 9.2  MG 2.0  --   --   --   --   --     Liver Function Tests: Recent Labs  Lab 07/23/2020 0815 07/20/20 0532 07/21/20 0745  AST 113* 66* 50*  ALT 33 54* 47*  ALKPHOS 67 66 75  BILITOT 0.9 0.6 0.6  PROT 6.8 6.5 6.7  ALBUMIN 3.2* 2.8* 2.9*    Coagulation Profile: Recent Labs  Lab 07/30/2020 0815  INR 1.3*    HbA1C: No results for input(s): HGBA1C in the last 72 hours.  CBG: No results for input(s): GLUCAP  in the last 168 hours.  Recent Results (from the past 240 hour(s))  Resp Panel by RT-PCR (Flu A&B, Covid) Nasopharyngeal Swab     Status: None   Collection Time: 07/15/20 10:14 AM   Specimen: Nasopharyngeal Swab; Nasopharyngeal(NP) swabs in vial transport medium  Result Value Ref Range Status   SARS Coronavirus 2 by RT PCR NEGATIVE NEGATIVE Final    Comment: (NOTE) SARS-CoV-2 target nucleic acids are NOT DETECTED.  The SARS-CoV-2 RNA is generally detectable in upper respiratory specimens during the acute phase of infection. The lowest concentration of SARS-CoV-2 viral copies this assay can detect is 138 copies/mL. A negative result does not  preclude SARS-Cov-2 infection and should not be used as the sole basis for treatment or other patient management decisions. A negative result may occur with  improper specimen collection/handling, submission of specimen other than nasopharyngeal swab, presence of viral mutation(s) within the areas targeted by this assay, and inadequate number of viral copies(<138 copies/mL). A negative result must be combined with clinical observations, patient history, and epidemiological information. The expected result is Negative.  Fact Sheet for Patients:  EntrepreneurPulse.com.au  Fact Sheet for Healthcare Providers:  IncredibleEmployment.be  This test is no t yet approved or cleared by the [place] FDA and  has been authorized for detection and/or diagnosis of SARS-CoV-2 by FDA under an Emergency Use Authorization (EUA). This EUA will remain  in effect (meaning this test can be used) for the duration of the COVID-19 declaration under Section 564(b)(1) of the Act, 21 U.S.C.section 360bbb-3(b)(1), unless the authorization is terminated  or revoked sooner.       Influenza A by PCR NEGATIVE NEGATIVE Final   Influenza B by PCR NEGATIVE NEGATIVE Final    Comment: (NOTE) The Xpert Xpress SARS-CoV-2/FLU/RSV plus  assay is intended as an aid in the diagnosis of influenza from Nasopharyngeal swab specimens and should not be used as a sole basis for treatment. Nasal washings and aspirates are unacceptable for Xpert Xpress SARS-CoV-2/FLU/RSV testing.  Fact Sheet for Patients: EntrepreneurPulse.com.au  Fact Sheet for Healthcare Providers: IncredibleEmployment.be  This test is not yet approved or cleared by the [place] FDA and has been authorized for detection and/or diagnosis of SARS-CoV-2 by FDA under an Emergency Use Authorization (EUA). This EUA will remain in effect (meaning this test can be used) for the duration of the COVID-19 declaration under Section 564(b)(1) of the Act, 21 U.S.C. section 360bbb-3(b)(1), unless the authorization is terminated or revoked.  Performed at [facility], [address], [place], [place] [address]   Blood culture (routine single)     Status: None (Preliminary result)   Collection Time: 08/06/2020  8:15 AM   Specimen: BLOOD  Result Value Ref Range Status   Specimen Description BLOOD RIGHT ANTECUBITAL  Final   Special Requests   Final    BOTTLES DRAWN AEROBIC AND ANAEROBIC Blood Culture adequate volume   Culture   Final    NO GROWTH 4 DAYS Performed at [facility], [address], [place], [place] [address]    Report Status PENDING  Incomplete  Urine culture     Status: None   Collection Time: 08/06/2020  8:15 AM   Specimen: In/Out Cath Urine  Result Value Ref Range Status   Specimen Description   Final    IN/OUT CATH URINE Performed at [facility], [address] [place], [place] [address]    Special Requests   Final    NONE Performed at [facility], [address], [place], [place] [address]    Culture   Final    NO GROWTH Performed at [facility], [place] [address], [place], [place] [address]    Report Status 07/18/2020 FINAL  Final  Resp Panel by RT-PCR  (Flu A&B, Covid)     Status: None   Collection Time: 07/15/2020  8:15 AM   Specimen: Nasopharyngeal(NP) swabs in vial transport medium  Result Value Ref Range Status   SARS Coronavirus 2 by RT PCR NEGATIVE NEGATIVE Final    Comment: (NOTE) SARS-CoV-2 target nucleic acids are NOT DETECTED.  The SARS-CoV-2 RNA is generally detectable in upper  respiratory specimens during the acute phase of infection. The lowest concentration of SARS-CoV-2 viral copies this assay can detect is 138 copies/mL. A negative result does not preclude SARS-Cov-2 infection and should not be used as the sole basis for treatment or other patient management decisions. A negative result may occur with  improper specimen collection/handling, submission of specimen other than nasopharyngeal swab, presence of viral mutation(s) within the areas targeted by this assay, and inadequate number of viral copies(<138 copies/mL). A negative result must be combined with clinical observations, patient history, and epidemiological information. The expected result is Negative.  Fact Sheet for Patients:  EntrepreneurPulse.com.au  Fact Sheet for Healthcare Providers:  IncredibleEmployment.be  This test is no t yet approved or cleared by the [place] FDA and  has been authorized for detection and/or diagnosis of SARS-CoV-2 by FDA under an Emergency Use Authorization (EUA). This EUA will remain  in effect (meaning this test can be used) for the duration of the COVID-19 declaration under Section 564(b)(1) of the Act, 21 U.S.C.section 360bbb-3(b)(1), unless the authorization is terminated  or revoked sooner.       Influenza A by PCR NEGATIVE NEGATIVE Final   Influenza B by PCR NEGATIVE NEGATIVE Final    Comment: (NOTE) The Xpert Xpress SARS-CoV-2/FLU/RSV plus assay is intended as an aid in the diagnosis of influenza from Nasopharyngeal swab specimens and should not be used as a sole basis  for treatment. Nasal washings and aspirates are unacceptable for Xpert Xpress SARS-CoV-2/FLU/RSV testing.  Fact Sheet for Patients: EntrepreneurPulse.com.au  Fact Sheet for Healthcare Providers: IncredibleEmployment.be  This test is not yet approved or cleared by the [place] FDA and has been authorized for detection and/or diagnosis of SARS-CoV-2 by FDA under an Emergency Use Authorization (EUA). This EUA will remain in effect (meaning this test can be used) for the duration of the COVID-19 declaration under Section 564(b)(1) of the Act, 21 U.S.C. section 360bbb-3(b)(1), unless the authorization is terminated or revoked.  Performed at [facility], [address], [place], [place] [address]      Radiology Studies: No results found.   Marzetta Board, MD, PhD [facility]  Between 7 am - 7 pm I am available, please contact me via Amion (for emergencies) or Securechat (non urgent messages)  Between 7 pm - 7 am I am not available, please contact night coverage MD/APP via Amion

## 2020-07-22 NOTE — Progress Notes (Signed)
   07/22/20 0925  Clinical Encounter Type  Visited With Patient  Visit Type Initial;Spiritual support;Social support  Referral From Other (Comment) (Rounding)  Aaron Burton established a relationship of care and support with Aaron Burton. Chaplain Burris provided reflective listening and compassionate presence. Aaron Burton engaged in life review and we addressed feelings around changing ability, life changes, grief and uncertainty. Chaplain Burris shared prayer with Aaron Burton; met with daughter, Aaron Burton, briefly at close of visit.

## 2020-07-22 NOTE — Consult Note (Signed)
NAME: Aaron Burton  DOB: 03/19/1928  MRN: [id number]  Date/Time: 07/22/2020 12:05 PM  REQUESTING PROVIDER: Dr. Renne Crigler Subjective:  REASON FOR CONSULT: pneumonia ? Aaron Burton is a 87 y.o. male with a history of Hypertension, gout, coronary artery disease, GERD presented to the ED on 07/15/2020 with cough nasal congestion for several days.  As per patient he had a cough for the past 4 weeks -6 weeks with some productive sputum.  He developed  increasing weakness.  Vitals in the ED BP 146/69, temperature 101, heart rate of 96.  COVID and flu was negative. Chest x-ray showed a questionable left lower lobe infiltrate.  Also was a right perihilar infiltrate.  He was sent from the ED with Augmentin and Z-Pak. He returned on 07/22/2020 with worsening shortness of breath.  Vitals revealed BP 117/55, pulse 88, temperature WBC was 11.6, Hb 10.9, platelet 87.  AST was 113.  Creatinine 1.78.  Sodium 134. He was started on IV Rocephin and doxycycline after receiving a dose of Vanco, cefepime and azithromycin.    Blood cultures sent I am consulted as patient is getting worse and needing more oxygen No travel No pets at home Lives on his own- independent of ADL Has taken 2 mRNA vaccine- no boosters    Past Medical History:  Diagnosis Date   Coronary artery disease    GERD (gastroesophageal reflux disease)    Gout    Hypercholesterolemia    Hypertension    Pneumonia     Past Surgical History:  Procedure Laterality Date   ACHILLES TENDON REPAIR     CAROTID ARTERY ANGIOPLASTY     COLON SURGERY     LEG SURGERY     LITHOTRIPSY      Social History   Socioeconomic History   Marital status: Widowed    Spouse name: Not on file   Number of children: Not on file   Years of education: Not on file   Highest education level: Not on file  Occupational History   Not on file  Tobacco Use   Smoking status: Never   Smokeless tobacco: Never  Vaping Use   Vaping Use: Never used  Substance and Sexual  Activity   Alcohol use: No   Drug use: No   Sexual activity: Not Currently  Other Topics Concern   Not on file  Social History Narrative   Not on file   Social Determinants of Health   Financial Resource Strain: Not on file  Food Insecurity: Not on file  Transportation Needs: Not on file  Physical Activity: Not on file  Stress: Not on file  Social Connections: Not on file  Intimate Partner Violence: Not on file    Family History  Problem Relation Age of Onset   Stroke Mother    Hypertension Father    Heart disease Father    Allergies  Allergen Reactions   Ace Inhibitors Swelling    Facial swelling   Haemophilus Influenzae Vaccines     pna after receving   I? Current Facility-Administered Medications  Medication Dose Route Frequency Provider Last Rate Last Admin   0.9 %  sodium chloride infusion   Intravenous PRN Oswald Hillock, MD   Stopped at 07/20/20 [id number]   acetaminophen (TYLENOL) tablet 650 mg  650 mg Oral Q6H PRN Ivor Costa, MD   650 mg at 07/18/20 1922   albuterol (PROVENTIL) (2.5 MG/3ML) 0.083% nebulizer solution 2.5 mg  2.5 mg Nebulization Q4H PRN Ivor Costa, MD  2.5 mg at 07/21/20 0344   apixaban (ELIQUIS) tablet 2.5 mg  2.5 mg Oral BID Clabe Seal, PA-C   2.5 mg at 07/22/20 1884   benzonatate (TESSALON) capsule 200 mg  200 mg Oral TID PRN Oswald Hillock, MD   200 mg at 07/21/20 2030   cefTRIAXone (ROCEPHIN) 1 g in sodium chloride 0.9 % 100 mL IVPB  1 g Intravenous Q24H Ivor Costa, MD 200 mL/hr at 07/21/20 2031 1 g at 07/21/20 2031   cholecalciferol (VITAMIN D3) tablet 5,000 Units  5,000 Units Oral Once per day on Mon Thu Niu, Soledad Gerlach, MD   5,000 Units at 07/21/20 1660   doxycycline (VIBRAMYCIN) 100 mg in sodium chloride 0.9 % 250 mL IVPB  100 mg Intravenous Huntley Dec, MD 125 mL/hr at 07/22/20 0948 100 mg at 07/22/20 0948   furosemide (LASIX) injection 20 mg  20 mg Intravenous BID Clabe Seal, PA-C   20 mg at 07/22/20 0813   gabapentin (NEURONTIN) capsule 600 mg   600 mg Oral QHS Ivor Costa, MD   600 mg at 07/21/20 2030   guaiFENesin (MUCINEX) 12 hr tablet 1,200 mg  1,200 mg Oral BID Oswald Hillock, MD   1,200 mg at 07/22/20 6301   hydrALAZINE (APRESOLINE) injection 5 mg  5 mg Intravenous Q2H PRN Ivor Costa, MD       ipratropium-albuterol (DUONEB) 0.5-2.5 (3) MG/3ML nebulizer solution 3 mL  3 mL Nebulization TID Oswald Hillock, MD   3 mL at 07/22/20 0743   metoprolol succinate (TOPROL-XL) 24 hr tablet 25 mg  25 mg Oral Daily Clabe Seal, PA-C   25 mg at 07/22/20 6010   multivitamin with minerals tablet 1 tablet  1 tablet Oral Daily Ivor Costa, MD   1 tablet at 07/22/20 0936   ondansetron (ZOFRAN) injection 4 mg  4 mg Intravenous Q8H PRN Ivor Costa, MD       senna-docusate (Senokot-S) tablet 0.5 tablet  0.5 tablet Oral QHS PRN Ivor Costa, MD       simvastatin (ZOCOR) tablet 20 mg  20 mg Oral QPM Ivor Costa, MD   20 mg at 07/21/20 1832     Abtx:  Anti-infectives (From admission, onward)    Start     Dose/Rate Route Frequency Ordered Stop   07/18/20 1000  doxycycline (VIBRAMYCIN) 100 mg in sodium chloride 0.9 % 250 mL IVPB        100 mg 125 mL/hr over 120 Minutes Intravenous Every 12 hours 07/25/2020 1805     08/05/2020 2000  cefTRIAXone (ROCEPHIN) 1 g in sodium chloride 0.9 % 100 mL IVPB        1 g 200 mL/hr over 30 Minutes Intravenous Every 24 hours 07/15/2020 1252     08/11/2020 1815  doxycycline (VIBRAMYCIN) 200 mg in dextrose 5 % 250 mL IVPB  Status:  Discontinued        200 mg 125 mL/hr over 120 Minutes Intravenous Every 12 hours 07/31/2020 1804 07/19/2020 1805   08/02/2020 1300  azithromycin (ZITHROMAX) 500 mg in sodium chloride 0.9 % 250 mL IVPB  Status:  Discontinued        500 mg 250 mL/hr over 60 Minutes Intravenous Every 24 hours 08/07/2020 1252 07/25/2020 1804   07/19/2020 0815  vancomycin (VANCOREADY) IVPB 1500 mg/300 mL        1,500 mg 150 mL/hr over 120 Minutes Intravenous  Once 07/24/2020 0807 08/03/2020 1457   07/30/2020 0815  ceFEPIme (MAXIPIME) 2 g in  sodium  chloride 0.9 % 100 mL IVPB        2 g 200 mL/hr over 30 Minutes Intravenous  Once 07/24/2020 0807 07/22/2020 1110       REVIEW OF SYSTEMS:  Const:  fever, negative chills, negative weight loss Eyes: negative diplopia or visual changes, negative eye pain ENT: coryza, negative sore throat Resp:  cough, , dyspnea, some blood tinged sputum Cards: negative for chest pain, palpitations, lower extremity edema GU: negative for frequency, dysuria and hematuria GI: Negative for abdominal pain, diarrhea, bleeding, constipation Skin: negative for rash and pruritus Heme: negative for easy bruising and gum/nose bleeding MS: generalized weakness Neurolo:negative for headaches, dizziness, vertigo, memory problems  Psych: negative for feelings of anxiety, depression  Endocrine: negative for thyroid, diabetes Allergy/Immunology- as above ? Objective:  VITALS:  BP 119/70 (BP Location: Right Arm)   Pulse 83   Temp 98.2 F (36.8 C) (Oral)   Resp (!) 25   Ht 5\' 6"  (1.676 m)   Wt 79 kg   SpO2 93%   BMI 28.12 kg/m  PHYSICAL EXAM:  General: Alert, cooperative, , appears stated age.  Head: Normocephalic, without obvious abnormality, atraumatic. Eyes: Conjunctivae clear, anicteric sclerae. Pupils are equal ENT Nares normal. No drainage or sinus tenderness. Lips, mucosa, and tongue normal. No Thrush Neck: Supple, symmetrical, no adenopathy, thyroid: non tender no carotid bruit and no JVD. Back: No CVA tenderness. Lungs:b/l air entry few rhonchi Heart: irregular Abdomen: Soft, n Extremities: atraumatic, no cyanosis. No edema. No clubbing Skin: No rashes or lesions. Or bruising Lymph: Cervical, supraclavicular normal. Neurologic: Grossly non-focal Pertinent Labs Lab Results CBC    Component Value Date/Time   WBC 38.6 (H) 07/22/2020 0547   RBC 3.41 (L) 07/22/2020 0547   HGB 9.6 (L) 07/22/2020 0547   HGB 11.8 (L) 09/04/2012 0356   HCT 29.5 (L) 07/22/2020 0547   HCT 33.9 (L) 09/04/2012  0356   PLT 123 (L) 07/22/2020 0547   PLT 106 (L) 09/04/2012 0356   MCV 86.5 07/22/2020 0547   MCV 95 09/04/2012 0356   MCH 28.2 07/22/2020 0547   MCHC 32.5 07/22/2020 0547   RDW 19.3 (H) 07/22/2020 0547   RDW 12.7 09/04/2012 0356   LYMPHSABS 1.5 08/04/2020 0815   LYMPHSABS 1.1 09/04/2012 0356   MONOABS 2.9 (H) 07/26/2020 0815   MONOABS 1.0 09/04/2012 0356   EOSABS 0.0 07/22/2020 0815   EOSABS 0.0 09/04/2012 0356   BASOSABS 0.1 08/02/2020 0815   BASOSABS 0.0 09/04/2012 0356    CMP Latest Ref Rng & Units 07/22/2020 07/21/2020 07/20/2020  Glucose 70 - 99 mg/dL 135(H) 131(H) 140(H)  BUN 8 - 23 mg/dL 57(H) 52(H) 50(H)  Creatinine 0.61 - 1.24 mg/dL 1.51(H) 1.61(H) 1.46(H)  Sodium 135 - 145 mmol/L 142 140 139  Potassium 3.5 - 5.1 mmol/L 4.4 3.9 4.3  Chloride 98 - 111 mmol/L 109 109 108  CO2 22 - 32 mmol/L 23 21(L) 20(L)  Calcium 8.9 - 10.3 mg/dL 9.2 9.3 9.2  Total Protein 6.5 - 8.1 g/dL - 6.7 6.5  Total Bilirubin 0.3 - 1.2 mg/dL - 0.6 0.6  Alkaline Phos 38 - 126 U/L - 75 66  AST 15 - 41 U/L - 50(H) 66(H)  ALT 0 - 44 U/L - 47(H) 54(H)      Microbiology: Recent Results (from the past 240 hour(s))  Resp Panel by RT-PCR (Flu A&B, Covid) Nasopharyngeal Swab     Status: None   Collection Time: 07/15/20 10:14 AM   Specimen: Nasopharyngeal Swab;  Nasopharyngeal(NP) swabs in vial transport medium  Result Value Ref Range Status   SARS Coronavirus 2 by RT PCR NEGATIVE NEGATIVE Final    Comment: (NOTE) SARS-CoV-2 target nucleic acids are NOT DETECTED.  The SARS-CoV-2 RNA is generally detectable in upper respiratory specimens during the acute phase of infection. The lowest concentration of SARS-CoV-2 viral copies this assay can detect is 138 copies/mL. A negative result does not preclude SARS-Cov-2 infection and should not be used as the sole basis for treatment or other patient management decisions. A negative result may occur with  improper specimen collection/handling, submission of  specimen other than nasopharyngeal swab, presence of viral mutation(s) within the areas targeted by this assay, and inadequate number of viral copies(<138 copies/mL). A negative result must be combined with clinical observations, patient history, and epidemiological information. The expected result is Negative.  Fact Sheet for Patients:  EntrepreneurPulse.com.au  Fact Sheet for Healthcare Providers:  IncredibleEmployment.be  This test is no t yet approved or cleared by the [place] FDA and  has been authorized for detection and/or diagnosis of SARS-CoV-2 by FDA under an Emergency Use Authorization (EUA). This EUA will remain  in effect (meaning this test can be used) for the duration of the COVID-19 declaration under Section 564(b)(1) of the Act, 21 U.S.C.section 360bbb-3(b)(1), unless the authorization is terminated  or revoked sooner.       Influenza A by PCR NEGATIVE NEGATIVE Final   Influenza B by PCR NEGATIVE NEGATIVE Final    Comment: (NOTE) The Xpert Xpress SARS-CoV-2/FLU/RSV plus assay is intended as an aid in the diagnosis of influenza from Nasopharyngeal swab specimens and should not be used as a sole basis for treatment. Nasal washings and aspirates are unacceptable for Xpert Xpress SARS-CoV-2/FLU/RSV testing.  Fact Sheet for Patients: EntrepreneurPulse.com.au  Fact Sheet for Healthcare Providers: IncredibleEmployment.be  This test is not yet approved or cleared by the [place] FDA and has been authorized for detection and/or diagnosis of SARS-CoV-2 by FDA under an Emergency Use Authorization (EUA). This EUA will remain in effect (meaning this test can be used) for the duration of the COVID-19 declaration under Section 564(b)(1) of the Act, 21 U.S.C. section 360bbb-3(b)(1), unless the authorization is terminated or revoked.  Performed at [facility], [address] [place], [place] [address]   Blood culture (routine single)     Status: None (Preliminary result)   Collection Time: 07/25/2020  8:15 AM   Specimen: BLOOD  Result Value Ref Range Status   Specimen Description BLOOD RIGHT ANTECUBITAL  Final   Special Requests   Final    BOTTLES DRAWN AEROBIC AND ANAEROBIC Blood Culture adequate volume   Culture   Final    NO GROWTH 4 DAYS Performed at [facility], [address], [place], [place] [address]    Report Status PENDING  Incomplete  Urine culture     Status: None   Collection Time: 08/11/2020  8:15 AM   Specimen: In/Out Cath Urine  Result Value Ref Range Status   Specimen Description   Final    IN/OUT CATH URINE Performed at [facility], [address], [place], [place] [address]    Special Requests   Final    NONE Performed at [facility], [address], [place], [place] [address]    Culture   Final    NO GROWTH Performed at [facility], [address] [place], [place] [address]    Report Status 07/18/2020 FINAL  Final  Resp Panel by RT-PCR (Flu A&B, Covid)     Status: None   Collection Time: 07/18/2020  8:15 AM   Specimen: Nasopharyngeal(NP) swabs in vial transport medium  Result Value Ref Range Status   SARS Coronavirus 2 by RT PCR NEGATIVE NEGATIVE Final    Comment: (NOTE) SARS-CoV-2 target nucleic acids are NOT DETECTED.  The SARS-CoV-2 RNA is generally detectable in upper respiratory specimens during the acute phase of infection. The lowest concentration of SARS-CoV-2 viral copies this assay can detect is 138 copies/mL. A negative result does not preclude SARS-Cov-2 infection and should not be used as the sole basis for treatment or other patient management decisions. A negative result may occur with  improper specimen collection/handling, submission of specimen other than nasopharyngeal swab, presence of viral mutation(s) within the areas targeted by this assay, and inadequate  number of viral copies(<138 copies/mL). A negative result must be combined with clinical observations, patient history, and epidemiological information. The expected result is Negative.  Fact Sheet for Patients:  EntrepreneurPulse.com.au  Fact Sheet for Healthcare Providers:  IncredibleEmployment.be  This test is no t yet approved or cleared by the [place] FDA and  has been authorized for detection and/or diagnosis of SARS-CoV-2 by FDA under an Emergency Use Authorization (EUA). This EUA will remain  in effect (meaning this test can be used) for the duration of the COVID-19 declaration under Section 564(b)(1) of the Act, 21 U.S.C.section 360bbb-3(b)(1), unless the authorization is terminated  or revoked sooner.       Influenza A by PCR NEGATIVE NEGATIVE Final   Influenza B by PCR NEGATIVE NEGATIVE Final    Comment: (NOTE) The Xpert Xpress SARS-CoV-2/FLU/RSV plus assay is intended as an aid in the diagnosis of influenza from Nasopharyngeal swab specimens and should not be used as a sole basis for treatment. Nasal washings and aspirates are unacceptable for Xpert Xpress SARS-CoV-2/FLU/RSV testing.  Fact Sheet for Patients: EntrepreneurPulse.com.au  Fact Sheet for Healthcare Providers: IncredibleEmployment.be  This test is not yet approved or cleared by the [place] FDA and has been authorized for detection and/or diagnosis of SARS-CoV-2 by FDA under an Emergency Use Authorization (EUA). This EUA will remain in effect (meaning this test can be used) for the duration of the COVID-19 declaration under Section 564(b)(1) of the Act, 21 U.S.C. section 360bbb-3(b)(1), unless the authorization is terminated or revoked.  Performed at [facility], [address] [place], [place] [address]     IMAGING RESULTS:  I have personally reviewed the films ? Impression/Recommendation ? ?87 yr  male who was admitted on 6/5 with cough and sob , and has been on ceftriaxone and doxy since then now with progressing sob and CT showing diffuse interstitial and airspace disease  Worsening resp infiltrates  D.D viral pneumonia Atypical pneumonia like mycoplasma legionella Post covid lung ( Though SARS cov2 is negative Aspiration pneumonitis unlikely  Leucocytosis could be from steroids Procalcitonin 0.10 indicative that this may not be bacterial pneumonia  MRSA nares neg Sent resp viral PCR Sputum Culture Beta D glucan COVID - nucleocapsid and spike RBD antibodies DC ceftriaxone and doxy and start azithromycin Recommend pulmonary consult ? ___________________________________________________ Discussed with patient, daughter requesting provider ID will follow him peripherally this weekend. Note:  This document was prepared using Dragon voice recognition software and may include unintentional dictation errors.

## 2020-07-23 DIAGNOSIS — B348 Other viral infections of unspecified site: Secondary | ICD-10-CM

## 2020-07-23 LAB — CBC
HCT: 28.7 % — ABNORMAL LOW (ref 39.0–52.0)
Hemoglobin: 9.2 g/dL — ABNORMAL LOW (ref 13.0–17.0)
MCH: 28 pg (ref 26.0–34.0)
MCHC: 32.1 g/dL (ref 30.0–36.0)
MCV: 87.5 fL (ref 80.0–100.0)
Platelets: 126 10*3/uL — ABNORMAL LOW (ref 150–400)
RBC: 3.28 MIL/uL — ABNORMAL LOW (ref 4.22–5.81)
RDW: 19 % — ABNORMAL HIGH (ref 11.5–15.5)
WBC: 27.5 10*3/uL — ABNORMAL HIGH (ref 4.0–10.5)
nRBC: 0.2 % (ref 0.0–0.2)

## 2020-07-23 LAB — BLOOD GAS, ARTERIAL
Acid-base deficit: 0 mmol/L (ref 0.0–2.0)
Bicarbonate: 23.8 mmol/L (ref 20.0–28.0)
Delivery systems: POSITIVE
Expiratory PAP: 5
FIO2: 1
Inspiratory PAP: 10
O2 Saturation: 99.9 %
Patient temperature: 37
pCO2 arterial: 35 mmHg (ref 32.0–48.0)
pH, Arterial: 7.44 (ref 7.350–7.450)
pO2, Arterial: 262 mmHg — ABNORMAL HIGH (ref 83.0–108.0)

## 2020-07-23 LAB — BASIC METABOLIC PANEL
Anion gap: 9 (ref 5–15)
BUN: 53 mg/dL — ABNORMAL HIGH (ref 8–23)
CO2: 23 mmol/L (ref 22–32)
Calcium: 8.8 mg/dL — ABNORMAL LOW (ref 8.9–10.3)
Chloride: 108 mmol/L (ref 98–111)
Creatinine, Ser: 1.61 mg/dL — ABNORMAL HIGH (ref 0.61–1.24)
GFR, Estimated: 41 mL/min — ABNORMAL LOW (ref >60)
Glucose, Bld: 121 mg/dL — ABNORMAL HIGH (ref 70–99)
Potassium: 4.2 mmol/L (ref 3.5–5.1)
Sodium: 140 mmol/L (ref 135–145)

## 2020-07-23 LAB — CULTURE, BLOOD (SINGLE)
Culture: NO GROWTH
Special Requests: ADEQUATE

## 2020-07-23 LAB — LEGIONELLA PNEUMOPHILA SEROGP 1 UR AG: L. pneumophila Serogp 1 Ur Ag: NEGATIVE

## 2020-07-23 MED ORDER — MORPHINE SULFATE (PF) 2 MG/ML IV SOLN
1.0000 mg | INTRAVENOUS | Status: DC | PRN
  Administered 2020-07-23 – 2020-07-27 (×17): 1 mg via INTRAVENOUS
  Filled 2020-07-23 (×18): qty 1

## 2020-07-23 MED ORDER — BUDESONIDE 0.25 MG/2ML IN SUSP
0.2500 mg | Freq: Two times a day (BID) | RESPIRATORY_TRACT | Status: DC
  Administered 2020-07-23 – 2020-07-26 (×7): 0.25 mg via RESPIRATORY_TRACT
  Filled 2020-07-23 (×8): qty 2

## 2020-07-23 MED ORDER — ARFORMOTEROL TARTRATE 15 MCG/2ML IN NEBU
15.0000 ug | INHALATION_SOLUTION | Freq: Two times a day (BID) | RESPIRATORY_TRACT | Status: DC
  Administered 2020-07-24 – 2020-07-26 (×6): 15 ug via RESPIRATORY_TRACT
  Filled 2020-07-23 (×8): qty 2

## 2020-07-23 MED ORDER — AZITHROMYCIN 250 MG PO TABS
500.0000 mg | ORAL_TABLET | Freq: Every day | ORAL | Status: AC
  Administered 2020-07-23 – 2020-07-26 (×4): 500 mg via ORAL
  Filled 2020-07-23 (×4): qty 2

## 2020-07-23 NOTE — Progress Notes (Signed)
Pt. Not ale to tolerate chest PT . Pt. Is too short of breath.

## 2020-07-23 NOTE — Progress Notes (Signed)
PHARMACIST - PHYSICIAN COMMUNICATION  CONCERNING: Antibiotic IV to Oral Route Change Policy  RECOMMENDATION: This patient is receiving azithromycin by the intravenous route.  Based on criteria approved by the Pharmacy and Therapeutics Committee, the antibiotic(s) is/are being converted to the equivalent oral dose form(s).   DESCRIPTION: These criteria include: Patient being treated for a respiratory tract infection, urinary tract infection, cellulitis or clostridium difficile associated diarrhea if on metronidazole The patient is not neutropenic and does not exhibit a GI malabsorption state The patient is eating (either orally or via tube) and/or has been taking other orally administered medications for a least 24 hours The patient is improving clinically and has a Tmax < 100.5  If you have questions about this conversion, please contact the [place]  07/23/20

## 2020-07-23 NOTE — Progress Notes (Addendum)
Pt's O2 sats 81% on 12L HF. RN to bedside to assses/RR 30 o2 83%/ o2 increased to 15L with improvement to 88%. RT made aware/will come to assess pt. Will continue to monitor.    UPDATE 0955: RT at bedside to assess pt/pt placed back on bipap due to WOB. Will continue to monitor.   UPDATE 1025: pt unable to tolerate bipap/placed back on 15L HF, with o2 sats 88-91%. Will continue to monitor.

## 2020-07-23 NOTE — Consult Note (Signed)
Reason for Consult: Bilateral pulmonary infiltrates Referring Physician: Marzetta Board MD  Aaron Burton is an 87 y.o. male.  HPI: Aaron Burton is a lifelong never smoker, with a history as noted below, who was admitted on 17 July 2020 with increasing shortness of breath cough, fevers and chills.  The patient noted to have had a cough for almost a week prior to admission.  As an outpatient he was evaluated and was diagnosed with pneumonia and started on azithromycin and Augmentin.  This was on 6/3.  Symptoms continue to worsen and the patient presented to the emergency room where he was noted to be COVID-negative, have a BNP of 764, white count of 11.6, lactic acid of 1.6 and a troponin level of 791.  The patient was noted to be tachypneic and had a fever of 102.9.  Tachypnea resolved with supplemental oxygen.  Continue to worsen and he was evaluated by infectious disease due to progressive pulmonary infiltrates.  Factious disease recommended pulmonary involvement as well.  He had a nasopharyngeal viral panel swab performed yesterday and this has resulted in parainfluenza 3 positivity.  I have reviewed the patient's imaging and it correlates with severe viral pneumonia with lung injury.  Unfortunately there is no specific treatment for parainfluenza and the patient fits the high risk population for severe disease with this virus.  Patient currently states that he continues to feel short of breath.  He has tenacious secretions.  He was on BiPAP last night however he feels that this did not help him and actually made his symptoms worse because he felt "smothered".  He does not endorse any other symptomatology currently.  I have reviewed all of his imaging.  He does have significant conversational dyspnea limits interview.   Past Medical History:  Diagnosis Date   Coronary artery disease    GERD (gastroesophageal reflux disease)    Gout    Hypercholesterolemia    Hypertension    Pneumonia     Past  Surgical History:  Procedure Laterality Date   ACHILLES TENDON REPAIR     CAROTID ARTERY ANGIOPLASTY     COLON SURGERY     LEG SURGERY     LITHOTRIPSY      Family History  Problem Relation Age of Onset   Stroke Mother    Hypertension Father    Heart disease Father     Social History:   Social History   Tobacco Use   Smoking status: Never   Smokeless tobacco: Never  Substance Use Topics   Alcohol use: No     Allergies:  Allergies  Allergen Reactions   Ace Inhibitors Swelling    Facial swelling   Haemophilus Influenzae Vaccines     pna after receving    Medications: I have reviewed the patient's current medications. Prior to Admission:  Medications Prior to Admission  Medication Sig Dispense Refill Last Dose   amLODipine (NORVASC) 2.5 MG tablet Take 1 tablet by mouth daily.   07/16/2020 at Unknown time   amLODipine (NORVASC) 5 MG tablet Take 1 tablet by mouth daily.   07/16/2020 at Unknown time   amoxicillin-clavulanate (AUGMENTIN) 875-125 MG tablet Take 1 tablet by mouth every 12 (twelve) hours. 14 tablet 0 07/16/2020 at Unknown time   azithromycin (ZITHROMAX Z-PAK) 250 MG tablet Take 1 tablet (250 mg total) by mouth daily. Take 2 tablets on the first day and then 1 tablet daily thereafter for a total of 5 days of treatment. 6 tablet 0 07/16/2020 at  Unknown time   benzonatate (TESSALON) 100 MG capsule Take 2 capsules (200 mg total) by mouth every 8 (eight) hours. 21 capsule 0 08/11/2020 at Unknown time   Cholecalciferol 125 MCG (5000 UT) TABS Take 5,000 Units by mouth 2 (two) times a week.   07/15/2020 at unk   clopidogrel (PLAVIX) 75 MG tablet Take 1 tablet by mouth daily.   07/16/2020 at Unknown time   gabapentin (NEURONTIN) 300 MG capsule Take 2 capsules by mouth at bedtime.   07/16/2020 at Unknown time   Multiple Vitamin (MULTI-VITAMINS) TABS Take 1 tablet by mouth daily.   07/16/2020 at Unknown time   Multiple Vitamins-Minerals (PRESERVISION AREDS 2 PO) Take 1 tablet by mouth 2  (two) times daily.   07/16/2020 at Unknown time   promethazine-dextromethorphan (PROMETHAZINE-DM) 6.25-15 MG/5ML syrup Take 5 mLs by mouth 4 (four) times daily as needed. (Patient taking differently: Take 5 mLs by mouth 4 (four) times daily as needed.) 118 mL 0 07/21/2020 at Unknown time   senna-docusate (SENOKOT-S) 8.6-50 MG tablet Take 0.5 tablets by mouth every evening.   prn at prn   simvastatin (ZOCOR) 20 MG tablet Take 20 mg by mouth every evening.    07/16/2020 at Unknown time   diphenhydrAMINE (BENADRYL) 25 mg capsule Take 1 capsule (25 mg total) by mouth 3 (three) times daily for 5 days. 15 capsule 0    famotidine (PEPCID) 10 MG tablet Take 1 tablet (10 mg total) by mouth 2 (two) times daily. (Patient not taking: Reported on 08/06/2020) 10 tablet 0 Not Taking at Unknown time   HYDROcodone-acetaminophen (NORCO/VICODIN) 5-325 MG tablet Take 1 tablet by mouth every 6 (six) hours as needed for severe pain. (Patient not taking: Reported on 08/01/2020) 20 tablet 0 Not Taking at Unknown time   methocarbamol (ROBAXIN) 500 MG tablet Take 1 tablet (500 mg total) by mouth 2 (two) times daily. (Patient not taking: Reported on 07/18/2020) 20 tablet 0 Not Taking at Unknown time   Scheduled:  apixaban  2.5 mg Oral BID   azithromycin  500 mg Oral Daily   cholecalciferol  5,000 Units Oral Once per day on Mon Thu   furosemide  40 mg Intravenous BID   gabapentin  600 mg Oral QHS   guaiFENesin  1,200 mg Oral BID   metoprolol succinate  25 mg Oral Daily   multivitamin with minerals  1 tablet Oral Daily   simvastatin  20 mg Oral QPM    Results for orders placed or performed during the hospital encounter of 08/06/2020 (from the past 48 hour(s))  Basic metabolic panel     Status: Abnormal   Collection Time: 07/22/20  5:47 AM  Result Value Ref Range   Sodium 142 135 - 145 mmol/L   Potassium 4.4 3.5 - 5.1 mmol/L   Chloride 109 98 - 111 mmol/L   CO2 23 22 - 32 mmol/L   Glucose, Bld 135 (H) 70 - 99 mg/dL    Comment:  Glucose reference range applies only to samples taken after fasting for at least 8 hours.   BUN 57 (H) 8 - 23 mg/dL   Creatinine, Ser 1.51 (H) 0.61 - 1.24 mg/dL   Calcium 9.2 8.9 - 10.3 mg/dL   GFR, Estimated 44 (L) >60 mL/min    Comment: (NOTE) Calculated using the CKD-EPI Creatinine Equation (2021)    Anion gap 10 5 - 15    Comment: Performed at [facility], [address] [place], [place] [address]  CBC  Status: Abnormal   Collection Time: 07/22/20  5:47 AM  Result Value Ref Range   WBC 38.6 (H) 4.0 - 10.5 K/uL   RBC 3.41 (L) 4.22 - 5.81 MIL/uL   Hemoglobin 9.6 (L) 13.0 - 17.0 g/dL   HCT 29.5 (L) 39.0 - 52.0 %   MCV 86.5 80.0 - 100.0 fL   MCH 28.2 26.0 - 34.0 pg   MCHC 32.5 30.0 - 36.0 g/dL   RDW 19.3 (H) 11.5 - 15.5 %   Platelets 123 (L) 150 - 400 K/uL    Comment: Immature Platelet Fraction may be clinically indicated, consider ordering this additional test [id number]    nRBC 0.1 0.0 - 0.2 %    Comment: Performed at [facility], [address] [place], [place] [address]  Brain natriuretic peptide     Status: Abnormal   Collection Time: 07/22/20  5:47 AM  Result Value Ref Range   B Natriuretic Peptide 798.0 (H) 0.0 - 100.0 pg/mL    Comment: Performed at [facility], [address] [place], [place] [address]  MRSA PCR Screening     Status: None   Collection Time: 07/22/20 12:14 PM   Specimen: Nasal Mucosa; Nasopharyngeal  Result Value Ref Range   MRSA by PCR NEGATIVE NEGATIVE    Comment:        The GeneXpert MRSA Assay (FDA approved for NASAL specimens only), is one component of a comprehensive MRSA colonization surveillance program. It is not intended to diagnose MRSA infection nor to guide or monitor treatment for MRSA infections. Performed at [facility], [address] [place], [place] [address]   Legionella Pneumophila Serogp 1 Ur Ag     Status: None   Collection Time: 07/22/20 12:14 PM  Result Value Ref  Range   L. pneumophila Serogp 1 Ur Ag Negative Negative    Comment: (NOTE) Presumptive negative for L. pneumophila serogroup 1 antigen in urine, suggesting no recent or current infection. Legionnaires' disease cannot be ruled out since other serogroups and species may also cause disease. Performed At: [facility] [place], [place] [id number] Rush Farmer MD CB:[id number]    Source of Sample URINE, RANDOM     Comment: Performed at [facility], [address] [place],  [address]  Strep pneumoniae urinary antigen     Status: None   Collection Time: 07/22/20 12:14 PM  Result Value Ref Range   Strep Pneumo Urinary Antigen NEGATIVE NEGATIVE    Comment:        Infection due to S. pneumoniae cannot be absolutely ruled out since the antigen present may be below the detection limit of the test. Performed at [facility], [address], [place],  [address]   Respiratory (~20 pathogens) panel by PCR     Status: Abnormal   Collection Time: 07/22/20 12:14 PM   Specimen: Nasopharyngeal Swab; Respiratory  Result Value Ref Range   Adenovirus NOT DETECTED NOT DETECTED   Coronavirus 229E NOT DETECTED NOT DETECTED    Comment: (NOTE) The Coronavirus on the Respiratory Panel, DOES NOT test for the novel  Coronavirus (2019 nCoV)    Coronavirus HKU1 NOT DETECTED NOT DETECTED   Coronavirus NL63 NOT DETECTED NOT DETECTED   Coronavirus OC43 NOT DETECTED NOT DETECTED   Metapneumovirus NOT DETECTED NOT DETECTED   Rhinovirus / Enterovirus NOT DETECTED NOT DETECTED   Influenza A NOT DETECTED NOT DETECTED   Influenza B NOT DETECTED NOT DETECTED   Parainfluenza Virus  1 NOT DETECTED NOT DETECTED   Parainfluenza Virus 2 NOT DETECTED NOT DETECTED   Parainfluenza Virus 3 DETECTED (A) NOT DETECTED   Parainfluenza Virus 4 NOT DETECTED NOT DETECTED   Respiratory Syncytial Virus NOT DETECTED NOT DETECTED   Bordetella pertussis NOT DETECTED NOT DETECTED    Bordetella Parapertussis NOT DETECTED NOT DETECTED   Chlamydophila pneumoniae NOT DETECTED NOT DETECTED   Mycoplasma pneumoniae NOT DETECTED NOT DETECTED    Comment: Performed at [facility], [place] [address], [place], [place] [address]  Procalcitonin - Baseline     Status: None   Collection Time: 07/22/20 12:32 PM  Result Value Ref Range   Procalcitonin 0.10 ng/mL    Comment:        Interpretation: PCT (Procalcitonin) <= 0.5 ng/mL: Systemic infection (sepsis) is not likely. Local bacterial infection is possible. (NOTE)       Sepsis PCT Algorithm           Lower Respiratory Tract                                      Infection PCT Algorithm    ----------------------------     ----------------------------         PCT < 0.25 ng/mL                PCT < 0.10 ng/mL          Strongly encourage             Strongly discourage   discontinuation of antibiotics    initiation of antibiotics    ----------------------------     -----------------------------       PCT 0.25 - 0.50 ng/mL            PCT 0.10 - 0.25 ng/mL               OR       >80% decrease in PCT            Discourage initiation of                                            antibiotics      Encourage discontinuation           of antibiotics    ----------------------------     -----------------------------         PCT >= 0.50 ng/mL              PCT 0.26 - 0.50 ng/mL               AND        <80% decrease in PCT             Encourage initiation of                                             antibiotics       Encourage continuation           of antibiotics    ----------------------------     -----------------------------        PCT >= 0.50 ng/mL  PCT > 0.50 ng/mL               AND         increase in PCT                  Strongly encourage                                      initiation of antibiotics    Strongly encourage escalation           of antibiotics                                      -----------------------------                                           PCT <= 0.25 ng/mL                                                 OR                                        > 80% decrease in PCT                                      Discontinue / Do not initiate                                             antibiotics  Performed at [facility], [address], [place], [place] [address]   Lactate dehydrogenase     Status: Abnormal   Collection Time: 07/22/20 12:32 PM  Result Value Ref Range   LDH 384 (H) 98 - 192 U/L    Comment: Performed at [facility], [address] [place], [place] [address]  Expectorated Sputum Assessment w Gram Stain, Rflx to Resp Cult     Status: None   Collection Time: 07/22/20  3:28 PM   Specimen: Expectorated Sputum  Result Value Ref Range   Specimen Description EXPECTORATED SPUTUM    Special Requests NONE    Sputum evaluation      THIS SPECIMEN IS ACCEPTABLE FOR SPUTUM CULTURE Performed at [facility], [address] [place], [place] [address]    Report Status 07/22/2020 FINAL   Blood gas, arterial     Status: Abnormal   Collection Time: 07/23/20  1:58 AM  Result Value Ref Range   FIO2 1.00    Delivery systems BILEVEL POSITIVE AIRWAY PRESSURE    Inspiratory PAP 10    Expiratory PAP 5    pH, Arterial 7.44 7.350 - 7.450   pCO2 arterial 35 32.0 - 48.0 mmHg   pO2, Arterial 262 (H) 83.0 - 108.0 mmHg   Bicarbonate 23.8 20.0 - 28.0 mmol/L  Acid-base deficit 0.0 0.0 - 2.0 mmol/L   O2 Saturation 99.9 %   Patient temperature 37.0    Collection site LEFT RADIAL    Sample type ARTERIAL DRAW    Allens test (pass/fail) YES (A) PASS    Comment: Performed at [facility], [address] [place], [place] [address]  Basic metabolic panel     Status: Abnormal   Collection Time: 07/23/20  5:53 AM  Result Value Ref Range   Sodium 140 135 - 145 mmol/L   Potassium 4.2 3.5 - 5.1 mmol/L   Chloride 108 98 - 111  mmol/L   CO2 23 22 - 32 mmol/L   Glucose, Bld 121 (H) 70 - 99 mg/dL    Comment: Glucose reference range applies only to samples taken after fasting for at least 8 hours.   BUN 53 (H) 8 - 23 mg/dL   Creatinine, Ser 1.61 (H) 0.61 - 1.24 mg/dL   Calcium 8.8 (L) 8.9 - 10.3 mg/dL   GFR, Estimated 41 (L) >60 mL/min    Comment: (NOTE) Calculated using the CKD-EPI Creatinine Equation (2021)    Anion gap 9 5 - 15    Comment: Performed at [facility], [address] [place], [place] [address]  CBC     Status: Abnormal   Collection Time: 07/23/20  5:53 AM  Result Value Ref Range   WBC 27.5 (H) 4.0 - 10.5 K/uL   RBC 3.28 (L) 4.22 - 5.81 MIL/uL   Hemoglobin 9.2 (L) 13.0 - 17.0 g/dL   HCT 28.7 (L) 39.0 - 52.0 %   MCV 87.5 80.0 - 100.0 fL   MCH 28.0 26.0 - 34.0 pg   MCHC 32.1 30.0 - 36.0 g/dL   RDW 19.0 (H) 11.5 - 15.5 %   Platelets 126 (L) 150 - 400 K/uL    Comment: Immature Platelet Fraction may be clinically indicated, consider ordering this additional test [id number]    nRBC 0.2 0.0 - 0.2 %    Comment: Performed at [facility], [address] [place], [place] [address]    DG Chest Port 1 View  Result Date: 07/22/2020 CLINICAL DATA:  Acute respiratory failure with hypoxia EXAM: PORTABLE CHEST 1 VIEW COMPARISON:  07/18/2020 FINDINGS: Diffuse bilateral airspace disease, worsening since prior study. Cardiomegaly. No visible effusions or pneumothorax. No acute bony abnormality. IMPRESSION: Worsening bilateral airspace disease could reflect edema or infection. Electronically Signed   By: Rolm Baptise M.D.   On: 07/22/2020 22:53      Review of Systems A 10 point review of systems was performed and it is as noted above otherwise negative.  VS: Blood pressure (!) 128/57, pulse 73, temperature 99.4 F (37.4 C), temperature source Oral, resp. rate (!) 25, height 5\' 6"  (1.676 m), weight 75.1 kg, SpO2 92 %. Physical Exam GENERAL: Elderly gentleman, tachypneic, mild  conversational dyspnea.  Awake and alert.  Looks somewhat uncomfortable.  Flow cannula. HEAD: Normocephalic, atraumatic.  EYES: Pupils equal, round, reactive to light.  No scleral icterus.  MOUTH: Oral mucosa moist. NECK: Supple. No thyromegaly. Trachea midline. No JVD.  No adenopathy. PULMONARY: Good air entry bilaterally.  There is diffuse wheezing, rhonchi at the bases.  CARDIOVASCULAR: S1 and S2. Regular rate and rhythm.  Grade 2/6 systolic ejection murmur left sternal border. ABDOMEN: Nondistended, normoactive bowel sounds, soft, nontender. MUSCULOSKELETAL: No joint deformity, no clubbing, no edema.  NEUROLOGIC: No overt focal deficit. SKIN: Intact,warm,dry. PSYCH: Anxious, behavior appropriate.  Assessment/Plan:  Acute respiratory failure with  hypoxia due to parainfluenza 3 pneumonia Acute lung injury/organizing pneumonia Bronchospasm due to parainfluenza 3 Continue oxygen supplementation to keep oxygen saturations between 88 to 92% Pulmonary hygiene MetaNeb Brovana via neb twice daily Budesonide via neb twice daily Albuterol as needed Unfortunately no antivirals that can treat parainfluenza 3 Elderly are at risk for more severe disease Prognosis is guarded  Leukocytosis Likely due to steroids Decreasing with discontinuation of IV steroids  CODE STATUS: DNR  Thank you for allowing [place] to participate in this patient's care.  We will continue to follow along.   Renold Don, MD [place] [facility] 07/23/2020, 5:26 PM

## 2020-07-23 NOTE — Progress Notes (Addendum)
PROGRESS NOTE  Aaron Burton [facility]:[id number] DOB: [birth year] DOA: 07/25/2020 PCP: Dion Body, MD   LOS: 6 days   Brief Narrative / Interim history: 87 year old male with HTN, HLD, CAD, chronic kidney disease stage IIIb, chronic thrombocytopenia came into the hospital with cough, shortness of breath, fever and chills.  CT angiogram on admission was negative for PE but did show infiltrates in the left lower lobe and bilateral upper lobes.  He was febrile at 102.9, tachycardic, tachypneic.  He was also hypoxic on room air requiring supplemental oxygen.  Subjective / 24h Interval events: Breathing is worse today, had to be on BiPAP overnight.  He is will be placed on heated high flow as he cannot maintain sats on 15 L  Assessment & Plan: Principal Problem Acute hypoxic respiratory failure due to multifocal community-acquired pneumonia due to parainfluenza 3 virus, with developing ARDS-unfortunately continues to get worse, now on heated high flow.  His respiratory panel showed parainfluenza 3 virus, there is no FDA approved treatment for this but supportive care -Appreciate ID involvement -Pulm to see.  Continue diuresis, nebulizers, supportive treatment -Confirmed DNR/no intubation with the daughter  Active Problems Elevated troponin, possible acute on chronic diastolic CHF-up to 662, trending down.  Cardiology consulted, felt to be demand ischemia. -has been placed on IV Lasix, continue. Renal function is stable.  Elevated D-dimer -CT angio negative for PE, lower extremity Doppler without evidence of DVT  Coronary artery disease -no chest pain, continue Plavix, Zocor  Paroxysmal A. fib-noted on telemetry, cardiology started low-dose Eliquis and his Plavix was discontinued.  Continue current regimen, hemoptysis seems to be getting a little bit better  Thrombocytopenia-chronic, monitor  Chronic kidney disease stage IIIb -Baseline creatinine ranges 1.4-1.8, stable with  Lasix  Essential hypertension-continue metoprolol  Goals of care-discussed with daughter at bedside.  He is getting worse, developing ARDS.  She is continuously hoping for improvement, but if he is to decline further probably need to transition to comfort measures.  She understands.  Scheduled Meds:  apixaban  2.5 mg Oral BID   cholecalciferol  5,000 Units Oral Once per day on Mon Thu   furosemide  40 mg Intravenous BID   gabapentin  600 mg Oral QHS   guaiFENesin  1,200 mg Oral BID   ipratropium-albuterol  3 mL Nebulization TID   metoprolol succinate  25 mg Oral Daily   multivitamin with minerals  1 tablet Oral Daily   simvastatin  20 mg Oral QPM   Continuous Infusions:  sodium chloride Stopped (07/20/20 0713)   azithromycin Stopped (07/22/20 1515)   PRN Meds:.sodium chloride, acetaminophen, albuterol, hydrALAZINE, ondansetron (ZOFRAN) IV, senna-docusate  Diet Orders (From admission, onward)     Start     Ordered   07/15/2020 1153  Diet Heart Room service appropriate? Yes; Fluid consistency: Thin  Diet effective now       Question Answer Comment  Room service appropriate? Yes   Fluid consistency: Thin   Na restriction, if any: 2 gm Na      08/10/2020 1152            DVT prophylaxis: apixaban (ELIQUIS) tablet 2.5 mg Start: 07/19/20 2200 apixaban (ELIQUIS) tablet 2.5 mg     Code Status: DNR  Family Communication: Daughter at bedside  Status is: Inpatient  Remains inpatient appropriate because:Inpatient level of care appropriate due to severity of illness  Dispo: The patient is from: Home              Anticipated  d/c is to: Home              Patient currently is not medically stable to d/c.   Difficult to place patient No  Level of care: Progressive Cardiac  Consultants:  Cardiology  ID  Procedures:  2D echo  Microbiology  none  Antimicrobials: Status post a course of ceftriaxone and doxycycline Azithromycin 6/10 with plans for 5  days   Objective: Vitals:   07/23/20 0247 07/23/20 0322 07/23/20 0421 07/23/20 0827  BP:  (!) 141/87  129/68  Pulse: 96 92  75  Resp:  (!) 21  20  Temp:  98.4 F (36.9 C)  98.7 F (37.1 C)  TempSrc:    Oral  SpO2: 98% 94%  90%  Weight:   75.1 kg   Height:        Intake/Output Summary (Last 24 hours) at 07/23/2020 1010 Last data filed at 07/23/2020 0100 Gross per 24 hour  Intake 280 ml  Output 700 ml  Net -420 ml    Filed Weights   07/20/20 0425 07/22/20 1359 07/23/20 0421  Weight: 79 kg 75 kg 75.1 kg    Examination:  Constitutional: On BiPAP, tachypneic Eyes: no icterus ENMT: mmm Neck: normal, supple Respiratory: bilateral rhonchi, coarse breath sounds bilaterally  Cardiovascular: rrr, no edema Abdomen: soft, nt, nd, bs+ Musculoskeletal: no clubbing / cyanosis.  Skin: no rashes Neurologic: non focal   Data Reviewed: I have independently reviewed following labs and imaging studies   CBC: Recent Labs  Lab 08/05/2020 0815 07/18/20 0521 07/19/20 0618 07/20/20 0532 07/21/20 0745 07/22/20 0547 07/23/20 0553  WBC 11.6*   < > 19.7* 20.6* 35.0* 38.6* 27.5*  NEUTROABS 6.9  --   --   --   --   --   --   HGB 10.9*   < > 9.7* 9.8* 9.8* 9.6* 9.2*  HCT 34.6*   < > 29.8* 30.1* 29.7* 29.5* 28.7*  MCV 91.3   < > 87.4 86.5 86.8 86.5 87.5  PLT 87*   < > 90* 101* 122* 123* 126*   < > = values in this interval not displayed.    Basic Metabolic Panel: Recent Labs  Lab 07/20/2020 0815 07/18/20 1660 07/19/20 0618 07/20/20 0532 07/21/20 0745 07/22/20 0547 07/23/20 0553  NA 134*   < > 134* 139 140 142 140  K 3.5   < > 4.1 4.3 3.9 4.4 4.2  CL 101   < > 105 108 109 109 108  CO2 21*   < > 21* 20* 21* 23 23  GLUCOSE 116*   < > 107* 140* 131* 135* 121*  BUN 30*   < > 48* 50* 52* 57* 53*  CREATININE 1.78*   < > 1.84* 1.46* 1.61* 1.51* 1.61*  CALCIUM 8.6*   < > 8.9 9.2 9.3 9.2 8.8*  MG 2.0  --   --   --   --   --   --    < > = values in this interval not displayed.     Liver Function Tests: Recent Labs  Lab 07/28/2020 0815 07/20/20 0532 07/21/20 0745  AST 113* 66* 50*  ALT 33 54* 47*  ALKPHOS 67 66 75  BILITOT 0.9 0.6 0.6  PROT 6.8 6.5 6.7  ALBUMIN 3.2* 2.8* 2.9*    Coagulation Profile: Recent Labs  Lab 08/01/2020 0815  INR 1.3*    HbA1C: No results for input(s): HGBA1C in the last 72 hours.  CBG: No results for  input(s): GLUCAP in the last 168 hours.  Recent Results (from the past 240 hour(s))  Resp Panel by RT-PCR (Flu A&B, Covid) Nasopharyngeal Swab     Status: None   Collection Time: 07/15/20 10:14 AM   Specimen: Nasopharyngeal Swab; Nasopharyngeal(NP) swabs in vial transport medium  Result Value Ref Range Status   SARS Coronavirus 2 by RT PCR NEGATIVE NEGATIVE Final    Comment: (NOTE) SARS-CoV-2 target nucleic acids are NOT DETECTED.  The SARS-CoV-2 RNA is generally detectable in upper respiratory specimens during the acute phase of infection. The lowest concentration of SARS-CoV-2 viral copies this assay can detect is 138 copies/mL. A negative result does not preclude SARS-Cov-2 infection and should not be used as the sole basis for treatment or other patient management decisions. A negative result may occur with  improper specimen collection/handling, submission of specimen other than nasopharyngeal swab, presence of viral mutation(s) within the areas targeted by this assay, and inadequate number of viral copies(<138 copies/mL). A negative result must be combined with clinical observations, patient history, and epidemiological information. The expected result is Negative.  Fact Sheet for Patients:  EntrepreneurPulse.com.au  Fact Sheet for Healthcare Providers:  IncredibleEmployment.be  This test is no t yet approved or cleared by the [place] FDA and  has been authorized for detection and/or diagnosis of SARS-CoV-2 by FDA under an Emergency Use Authorization (EUA). This EUA  will remain  in effect (meaning this test can be used) for the duration of the COVID-19 declaration under Section 564(b)(1) of the Act, 21 U.S.C.section 360bbb-3(b)(1), unless the authorization is terminated  or revoked sooner.       Influenza A by PCR NEGATIVE NEGATIVE Final   Influenza B by PCR NEGATIVE NEGATIVE Final    Comment: (NOTE) The Xpert Xpress SARS-CoV-2/FLU/RSV plus assay is intended as an aid in the diagnosis of influenza from Nasopharyngeal swab specimens and should not be used as a sole basis for treatment. Nasal washings and aspirates are unacceptable for Xpert Xpress SARS-CoV-2/FLU/RSV testing.  Fact Sheet for Patients: EntrepreneurPulse.com.au  Fact Sheet for Healthcare Providers: IncredibleEmployment.be  This test is not yet approved or cleared by the [place] FDA and has been authorized for detection and/or diagnosis of SARS-CoV-2 by FDA under an Emergency Use Authorization (EUA). This EUA will remain in effect (meaning this test can be used) for the duration of the COVID-19 declaration under Section 564(b)(1) of the Act, 21 U.S.C. section 360bbb-3(b)(1), unless the authorization is terminated or revoked.  Performed at [facility], [address], [place], [place] [address]   Blood culture (routine single)     Status: None (Preliminary result)   Collection Time: 07/18/2020  8:15 AM   Specimen: BLOOD  Result Value Ref Range Status   Specimen Description BLOOD RIGHT ANTECUBITAL  Final   Special Requests   Final    BOTTLES DRAWN AEROBIC AND ANAEROBIC Blood Culture adequate volume   Culture   Final    NO GROWTH 4 DAYS Performed at [facility], [address], [place], [place] [address]    Report Status PENDING  Incomplete  Urine culture     Status: None   Collection Time: 07/14/2020  8:15 AM   Specimen: In/Out Cath Urine  Result Value Ref Range Status   Specimen Description   Final     IN/OUT CATH URINE Performed at [facility], [address] [place], [place] [address]    Special Requests   Final    NONE Performed  at [facility], [address] [place], [place] [address]    Culture   Final    NO GROWTH Performed at [facility], [place] [address] [place], [place] [address]    Report Status 07/18/2020 FINAL  Final  Resp Panel by RT-PCR (Flu A&B, Covid)     Status: None   Collection Time: 07/20/2020  8:15 AM   Specimen: Nasopharyngeal(NP) swabs in vial transport medium  Result Value Ref Range Status   SARS Coronavirus 2 by RT PCR NEGATIVE NEGATIVE Final    Comment: (NOTE) SARS-CoV-2 target nucleic acids are NOT DETECTED.  The SARS-CoV-2 RNA is generally detectable in upper respiratory specimens during the acute phase of infection. The lowest concentration of SARS-CoV-2 viral copies this assay can detect is 138 copies/mL. A negative result does not preclude SARS-Cov-2 infection and should not be used as the sole basis for treatment or other patient management decisions. A negative result may occur with  improper specimen collection/handling, submission of specimen other than nasopharyngeal swab, presence of viral mutation(s) within the areas targeted by this assay, and inadequate number of viral copies(<138 copies/mL). A negative result must be combined with clinical observations, patient history, and epidemiological information. The expected result is Negative.  Fact Sheet for Patients:  EntrepreneurPulse.com.au  Fact Sheet for Healthcare Providers:  IncredibleEmployment.be  This test is no t yet approved or cleared by the [place] FDA and  has been authorized for detection and/or diagnosis of SARS-CoV-2 by FDA under an Emergency Use Authorization (EUA). This EUA will remain  in effect (meaning this test can be used) for the duration of the COVID-19 declaration under Section 564(b)(1) of the  Act, 21 U.S.C.section 360bbb-3(b)(1), unless the authorization is terminated  or revoked sooner.       Influenza A by PCR NEGATIVE NEGATIVE Final   Influenza B by PCR NEGATIVE NEGATIVE Final    Comment: (NOTE) The Xpert Xpress SARS-CoV-2/FLU/RSV plus assay is intended as an aid in the diagnosis of influenza from Nasopharyngeal swab specimens and should not be used as a sole basis for treatment. Nasal washings and aspirates are unacceptable for Xpert Xpress SARS-CoV-2/FLU/RSV testing.  Fact Sheet for Patients: EntrepreneurPulse.com.au  Fact Sheet for Healthcare Providers: IncredibleEmployment.be  This test is not yet approved or cleared by the [place] FDA and has been authorized for detection and/or diagnosis of SARS-CoV-2 by FDA under an Emergency Use Authorization (EUA). This EUA will remain in effect (meaning this test can be used) for the duration of the COVID-19 declaration under Section 564(b)(1) of the Act, 21 U.S.C. section 360bbb-3(b)(1), unless the authorization is terminated or revoked.  Performed at [facility], [address] [place], [place] [address]   MRSA PCR Screening     Status: None   Collection Time: 07/22/20 12:14 PM   Specimen: Nasal Mucosa; Nasopharyngeal  Result Value Ref Range Status   MRSA by PCR NEGATIVE NEGATIVE Final    Comment:        The GeneXpert MRSA Assay (FDA approved for NASAL specimens only), is one component of a comprehensive MRSA colonization surveillance program. It is not intended to diagnose MRSA infection nor to guide or monitor treatment for MRSA infections. Performed at [facility], [place], [place] [address]   Respiratory (~20 pathogens) panel by PCR     Status: Abnormal   Collection Time: 07/22/20 12:14 PM   Specimen: Nasopharyngeal Swab; Respiratory  Result Value Ref Range Status   Adenovirus NOT  DETECTED NOT DETECTED Final    Coronavirus 229E NOT DETECTED NOT DETECTED Final    Comment: (NOTE) The Coronavirus on the Respiratory Panel, DOES NOT test for the novel  Coronavirus (2019 nCoV)    Coronavirus HKU1 NOT DETECTED NOT DETECTED Final   Coronavirus NL63 NOT DETECTED NOT DETECTED Final   Coronavirus OC43 NOT DETECTED NOT DETECTED Final   Metapneumovirus NOT DETECTED NOT DETECTED Final   Rhinovirus / Enterovirus NOT DETECTED NOT DETECTED Final   Influenza A NOT DETECTED NOT DETECTED Final   Influenza B NOT DETECTED NOT DETECTED Final   Parainfluenza Virus 1 NOT DETECTED NOT DETECTED Final   Parainfluenza Virus 2 NOT DETECTED NOT DETECTED Final   Parainfluenza Virus 3 DETECTED (A) NOT DETECTED Final   Parainfluenza Virus 4 NOT DETECTED NOT DETECTED Final   Respiratory Syncytial Virus NOT DETECTED NOT DETECTED Final   Bordetella pertussis NOT DETECTED NOT DETECTED Final   Bordetella Parapertussis NOT DETECTED NOT DETECTED Final   Chlamydophila pneumoniae NOT DETECTED NOT DETECTED Final   Mycoplasma pneumoniae NOT DETECTED NOT DETECTED Final    Comment: Performed at [facility], [place] [address], [place], [place] [address]  Expectorated Sputum Assessment w Gram Stain, Rflx to Resp Cult     Status: None   Collection Time: 07/22/20  3:28 PM   Specimen: Expectorated Sputum  Result Value Ref Range Status   Specimen Description EXPECTORATED SPUTUM  Final   Special Requests NONE  Final   Sputum evaluation   Final    THIS SPECIMEN IS ACCEPTABLE FOR SPUTUM CULTURE Performed at [facility], [address], [place], [place] [address]    Report Status 07/22/2020 FINAL  Final     Radiology Studies: DG Chest Port 1 View  Result Date: 07/22/2020 CLINICAL DATA:  Acute respiratory failure with hypoxia EXAM: PORTABLE CHEST 1 VIEW COMPARISON:  08/05/2020 FINDINGS: Diffuse bilateral airspace disease, worsening since prior study. Cardiomegaly. No visible effusions or pneumothorax. No acute bony abnormality.  IMPRESSION: Worsening bilateral airspace disease could reflect edema or infection. Electronically Signed   By: Rolm Baptise M.D.   On: 07/22/2020 22:53     Marzetta Board, MD, PhD [facility]  Between 7 am - 7 pm I am available, please contact me via Amion (for emergencies) or Securechat (non urgent messages)  Between 7 pm - 7 am I am not available, please contact night coverage MD/APP via Amion

## 2020-07-24 DIAGNOSIS — J80 Acute respiratory distress syndrome: Secondary | ICD-10-CM

## 2020-07-24 LAB — COMPREHENSIVE METABOLIC PANEL
ALT: 38 U/L (ref 0–44)
AST: 44 U/L — ABNORMAL HIGH (ref 15–41)
Albumin: 2.5 g/dL — ABNORMAL LOW (ref 3.5–5.0)
Alkaline Phosphatase: 122 U/L (ref 38–126)
Anion gap: 10 (ref 5–15)
BUN: 56 mg/dL — ABNORMAL HIGH (ref 8–23)
CO2: 27 mmol/L (ref 22–32)
Calcium: 8.6 mg/dL — ABNORMAL LOW (ref 8.9–10.3)
Chloride: 103 mmol/L (ref 98–111)
Creatinine, Ser: 1.71 mg/dL — ABNORMAL HIGH (ref 0.61–1.24)
GFR, Estimated: 38 mL/min — ABNORMAL LOW (ref >60)
Glucose, Bld: 129 mg/dL — ABNORMAL HIGH (ref 70–99)
Potassium: 3.8 mmol/L (ref 3.5–5.1)
Sodium: 140 mmol/L (ref 135–145)
Total Bilirubin: 1.4 mg/dL — ABNORMAL HIGH (ref 0.3–1.2)
Total Protein: 6.4 g/dL — ABNORMAL LOW (ref 6.5–8.1)

## 2020-07-24 LAB — CBC
HCT: 28.2 % — ABNORMAL LOW (ref 39.0–52.0)
Hemoglobin: 9 g/dL — ABNORMAL LOW (ref 13.0–17.0)
MCH: 28.2 pg (ref 26.0–34.0)
MCHC: 31.9 g/dL (ref 30.0–36.0)
MCV: 88.4 fL (ref 80.0–100.0)
Platelets: 112 10*3/uL — ABNORMAL LOW (ref 150–400)
RBC: 3.19 MIL/uL — ABNORMAL LOW (ref 4.22–5.81)
RDW: 18.9 % — ABNORMAL HIGH (ref 11.5–15.5)
WBC: 26.7 10*3/uL — ABNORMAL HIGH (ref 4.0–10.5)
nRBC: 0.1 % (ref 0.0–0.2)

## 2020-07-24 LAB — PHOSPHORUS: Phosphorus: 4.3 mg/dL (ref 2.5–4.6)

## 2020-07-24 LAB — MAGNESIUM: Magnesium: 1.9 mg/dL (ref 1.7–2.4)

## 2020-07-24 MED ORDER — FUROSEMIDE 10 MG/ML IJ SOLN
40.0000 mg | Freq: Every day | INTRAMUSCULAR | Status: DC
  Administered 2020-07-25: 40 mg via INTRAVENOUS
  Filled 2020-07-24: qty 4

## 2020-07-24 NOTE — Progress Notes (Addendum)
PROGRESS NOTE  Aaron Burton HFW:[id number] DOB: [birth year] DOA: 08/08/2020 PCP: Dion Body, MD   LOS: 7 days   Brief Narrative / Interim history: 87 year old male with HTN, HLD, CAD, chronic kidney disease stage IIIb, chronic thrombocytopenia came into the hospital with cough, shortness of breath, fever and chills.  CT angiogram on admission was negative for PE but did show infiltrates in the left lower lobe and bilateral upper lobes.  He was febrile at 102.9, tachycardic, tachypneic.  He was also hypoxic on room air requiring supplemental oxygen.  Hospital course complicated by developing ARDS and progressive hypoxia.  Etiology is parainfluenza 3 virus  Subjective / 24h Interval events: Feels better, on heated high flow high percent FiO2 this morning.  No chest pain, no nausea or vomiting.  Assessment & Plan: Principal Problem Acute hypoxic respiratory failure due to multifocal community-acquired pneumonia due to parainfluenza 3 virus, with ARDS-unfortunately continues to get worse, now on heated high flow.  His respiratory panel showed parainfluenza 3 virus, there is no FDA approved treatment for this but supportive care.  Suspect he may have had the original infection prior to coming to the hospital when his symptoms started couple of weeks ago -Appreciate ID and pulmonary involvement -Continue diuresis, nebulizers, supportive treatment -Confirmed DNR/no intubation with the daughter  Active Problems Elevated troponin, possible acute on chronic diastolic CHF-up to 027, trending down.  Cardiology consulted, felt to be demand ischemia. -has been placed on IV Lasix, continue.  Creatinine slightly up but continue Lasix in the setting of ARDS.  Change Lasix to daily  Elevated D-dimer -CT angio negative for PE, lower extremity Doppler without evidence of DVT  Coronary artery disease -no chest pain, continue Plavix, Zocor  Paroxysmal A. fib-noted on telemetry, cardiology started  low-dose Eliquis and his Plavix was discontinued.  Continue current regimen, no longer has hemoptysis  Thrombocytopenia-chronic, monitor  Chronic kidney disease stage IIIb -Baseline creatinine ranges 1.4-1.8, stable with Lasix  Essential hypertension-continue metoprolol  Goals of care-discussed with daughter at bedside.  He is getting worse, developing ARDS.  She is continuously hoping for improvement, but if he is to decline further probably need to transition to comfort measures.  She understands.  Scheduled Meds:  apixaban  2.5 mg Oral BID   arformoterol  15 mcg Nebulization BID   azithromycin  500 mg Oral Daily   budesonide (PULMICORT) nebulizer solution  0.25 mg Nebulization BID   cholecalciferol  5,000 Units Oral Once per day on Mon Thu   furosemide  40 mg Intravenous BID   gabapentin  600 mg Oral QHS   guaiFENesin  1,200 mg Oral BID   metoprolol succinate  25 mg Oral Daily   multivitamin with minerals  1 tablet Oral Daily   simvastatin  20 mg Oral QPM   Continuous Infusions:  sodium chloride Stopped (07/20/20 0713)   PRN Meds:.sodium chloride, acetaminophen, albuterol, hydrALAZINE, morphine injection, ondansetron (ZOFRAN) IV, senna-docusate  Diet Orders (From admission, onward)     Start     Ordered   08/05/2020 1153  Diet Heart Room service appropriate? Yes; Fluid consistency: Thin  Diet effective now       Question Answer Comment  Room service appropriate? Yes   Fluid consistency: Thin   Na restriction, if any: 2 gm Na      08/10/2020 1152            DVT prophylaxis: apixaban (ELIQUIS) tablet 2.5 mg Start: 07/19/20 2200 apixaban (ELIQUIS) tablet 2.5 mg  Code Status: DNR  Family Communication: Daughter at bedside  Status is: Inpatient  Remains inpatient appropriate because:Inpatient level of care appropriate due to severity of illness  Dispo: The patient is from: Home              Anticipated d/c is to: Home              Patient currently is not  medically stable to d/c.   Difficult to place patient No  Level of care: Progressive Cardiac  Consultants:  Cardiology  ID  Procedures:  2D echo  Microbiology  none  Antimicrobials: Status post a course of ceftriaxone and doxycycline Azithromycin 6/10 with plans for 5 days   Objective: Vitals:   07/24/20 0451 07/24/20 0452 07/24/20 0729 07/24/20 0855  BP:    127/75  Pulse:    69  Resp:    (!) 25  Temp:    98.1 F (36.7 C)  TempSrc:      SpO2: 92% 93% 93% 100%  Weight:      Height:        Intake/Output Summary (Last 24 hours) at 07/24/2020 1052 Last data filed at 07/24/2020 0500 Gross per 24 hour  Intake 940 ml  Output 4300 ml  Net -3360 ml    Filed Weights   07/22/20 1359 07/23/20 0421 07/24/20 0446  Weight: 75 kg 75.1 kg 74.9 kg    Examination:  Constitutional: Comfortable on heated high flow Eyes: No icterus ENMT: mmm Neck: normal, supple Respiratory: Bilateral rhonchi, no wheezing Cardiovascular: Regular rate and rhythm, no edema Abdomen: Soft, nontender, nondistended, bowel sounds positive Musculoskeletal: no clubbing / cyanosis.  Skin: No rashes seen Neurologic: No focal deficits  Data Reviewed: I have independently reviewed following labs and imaging studies   CBC: Recent Labs  Lab 07/20/20 0532 07/21/20 0745 07/22/20 0547 07/23/20 0553 07/24/20 0408  WBC 20.6* 35.0* 38.6* 27.5* 26.7*  HGB 9.8* 9.8* 9.6* 9.2* 9.0*  HCT 30.1* 29.7* 29.5* 28.7* 28.2*  MCV 86.5 86.8 86.5 87.5 88.4  PLT 101* 122* 123* 126* 112*    Basic Metabolic Panel: Recent Labs  Lab 07/20/20 0532 07/21/20 0745 07/22/20 0547 07/23/20 0553 07/24/20 0408  NA 139 140 142 140 140  K 4.3 3.9 4.4 4.2 3.8  CL 108 109 109 108 103  CO2 20* 21* 23 23 27   GLUCOSE 140* 131* 135* 121* 129*  BUN 50* 52* 57* 53* 56*  CREATININE 1.46* 1.61* 1.51* 1.61* 1.71*  CALCIUM 9.2 9.3 9.2 8.8* 8.6*  MG  --   --   --   --  1.9  PHOS  --   --   --   --  4.3    Liver Function  Tests: Recent Labs  Lab 07/20/20 0532 07/21/20 0745 07/24/20 0408  AST 66* 50* 44*  ALT 54* 47* 38  ALKPHOS 66 75 122  BILITOT 0.6 0.6 1.4*  PROT 6.5 6.7 6.4*  ALBUMIN 2.8* 2.9* 2.5*    Coagulation Profile: No results for input(s): INR, PROTIME in the last 168 hours.  HbA1C: No results for input(s): HGBA1C in the last 72 hours.  CBG: No results for input(s): GLUCAP in the last 168 hours.  Recent Results (from the past 240 hour(s))  Resp Panel by RT-PCR (Flu A&B, Covid) Nasopharyngeal Swab     Status: None   Collection Time: 07/15/20 10:14 AM   Specimen: Nasopharyngeal Swab; Nasopharyngeal(NP) swabs in vial transport medium  Result Value Ref Range Status   SARS Coronavirus  2 by RT PCR NEGATIVE NEGATIVE Final    Comment: (NOTE) SARS-CoV-2 target nucleic acids are NOT DETECTED.  The SARS-CoV-2 RNA is generally detectable in upper respiratory specimens during the acute phase of infection. The lowest concentration of SARS-CoV-2 viral copies this assay can detect is 138 copies/mL. A negative result does not preclude SARS-Cov-2 infection and should not be used as the sole basis for treatment or other patient management decisions. A negative result may occur with  improper specimen collection/handling, submission of specimen other than nasopharyngeal swab, presence of viral mutation(s) within the areas targeted by this assay, and inadequate number of viral copies(<138 copies/mL). A negative result must be combined with clinical observations, patient history, and epidemiological information. The expected result is Negative.  Fact Sheet for Patients:  EntrepreneurPulse.com.au  Fact Sheet for Healthcare Providers:  IncredibleEmployment.be  This test is no t yet approved or cleared by the [place] FDA and  has been authorized for detection and/or diagnosis of SARS-CoV-2 by FDA under an Emergency Use Authorization (EUA). This EUA will  remain  in effect (meaning this test can be used) for the duration of the COVID-19 declaration under Section 564(b)(1) of the Act, 21 U.S.C.section 360bbb-3(b)(1), unless the authorization is terminated  or revoked sooner.       Influenza A by PCR NEGATIVE NEGATIVE Final   Influenza B by PCR NEGATIVE NEGATIVE Final    Comment: (NOTE) The Xpert Xpress SARS-CoV-2/FLU/RSV plus assay is intended as an aid in the diagnosis of influenza from Nasopharyngeal swab specimens and should not be used as a sole basis for treatment. Nasal washings and aspirates are unacceptable for Xpert Xpress SARS-CoV-2/FLU/RSV testing.  Fact Sheet for Patients: EntrepreneurPulse.com.au  Fact Sheet for Healthcare Providers: IncredibleEmployment.be  This test is not yet approved or cleared by the [place] FDA and has been authorized for detection and/or diagnosis of SARS-CoV-2 by FDA under an Emergency Use Authorization (EUA). This EUA will remain in effect (meaning this test can be used) for the duration of the COVID-19 declaration under Section 564(b)(1) of the Act, 21 U.S.C. section 360bbb-3(b)(1), unless the authorization is terminated or revoked.  Performed at [facility], [address], [place], [place] [address]   Blood culture (routine single)     Status: None   Collection Time: 07/15/2020  8:15 AM   Specimen: BLOOD  Result Value Ref Range Status   Specimen Description BLOOD RIGHT ANTECUBITAL  Final   Special Requests   Final    BOTTLES DRAWN AEROBIC AND ANAEROBIC Blood Culture adequate volume   Culture   Final    NO GROWTH 6 DAYS Performed at [facility], [address] [place], [place] [address]    Report Status 07/23/2020 FINAL  Final  Urine culture     Status: None   Collection Time: 07/16/2020  8:15 AM   Specimen: In/Out Cath Urine  Result Value Ref Range Status   Specimen Description   Final    IN/OUT CATH  URINE Performed at [facility], [address], [place], [place] [address]    Special Requests   Final    NONE Performed at [facility], [address], [place], [place] [address]    Culture   Final    NO GROWTH Performed at [facility], [place] [address], [place], [place] [address]    Report Status 07/18/2020 FINAL  Final  Resp Panel by RT-PCR (Flu A&B, Covid)     Status: None  Collection Time: 08/06/2020  8:15 AM   Specimen: Nasopharyngeal(NP) swabs in vial transport medium  Result Value Ref Range Status   SARS Coronavirus 2 by RT PCR NEGATIVE NEGATIVE Final    Comment: (NOTE) SARS-CoV-2 target nucleic acids are NOT DETECTED.  The SARS-CoV-2 RNA is generally detectable in upper respiratory specimens during the acute phase of infection. The lowest concentration of SARS-CoV-2 viral copies this assay can detect is 138 copies/mL. A negative result does not preclude SARS-Cov-2 infection and should not be used as the sole basis for treatment or other patient management decisions. A negative result may occur with  improper specimen collection/handling, submission of specimen other than nasopharyngeal swab, presence of viral mutation(s) within the areas targeted by this assay, and inadequate number of viral copies(<138 copies/mL). A negative result must be combined with clinical observations, patient history, and epidemiological information. The expected result is Negative.  Fact Sheet for Patients:  EntrepreneurPulse.com.au  Fact Sheet for Healthcare Providers:  IncredibleEmployment.be  This test is no t yet approved or cleared by the [place] FDA and  has been authorized for detection and/or diagnosis of SARS-CoV-2 by FDA under an Emergency Use Authorization (EUA). This EUA will remain  in effect (meaning this test can be used) for the duration of the COVID-19 declaration under Section 564(b)(1) of the Act,  21 U.S.C.section 360bbb-3(b)(1), unless the authorization is terminated  or revoked sooner.       Influenza A by PCR NEGATIVE NEGATIVE Final   Influenza B by PCR NEGATIVE NEGATIVE Final    Comment: (NOTE) The Xpert Xpress SARS-CoV-2/FLU/RSV plus assay is intended as an aid in the diagnosis of influenza from Nasopharyngeal swab specimens and should not be used as a sole basis for treatment. Nasal washings and aspirates are unacceptable for Xpert Xpress SARS-CoV-2/FLU/RSV testing.  Fact Sheet for Patients: EntrepreneurPulse.com.au  Fact Sheet for Healthcare Providers: IncredibleEmployment.be  This test is not yet approved or cleared by the [place] FDA and has been authorized for detection and/or diagnosis of SARS-CoV-2 by FDA under an Emergency Use Authorization (EUA). This EUA will remain in effect (meaning this test can be used) for the duration of the COVID-19 declaration under Section 564(b)(1) of the Act, 21 U.S.C. section 360bbb-3(b)(1), unless the authorization is terminated or revoked.  Performed at [facility], [address] [place], [place] [address]   MRSA PCR Screening     Status: None   Collection Time: 07/22/20 12:14 PM   Specimen: Nasal Mucosa; Nasopharyngeal  Result Value Ref Range Status   MRSA by PCR NEGATIVE NEGATIVE Final    Comment:        The GeneXpert MRSA Assay (FDA approved for NASAL specimens only), is one component of a comprehensive MRSA colonization surveillance program. It is not intended to diagnose MRSA infection nor to guide or monitor treatment for MRSA infections. Performed at [facility], [place], [place] [address]   Respiratory (~20 pathogens) panel by PCR     Status: Abnormal   Collection Time: 07/22/20 12:14 PM   Specimen: Nasopharyngeal Swab; Respiratory  Result Value Ref Range Status   Adenovirus NOT DETECTED NOT DETECTED Final   Coronavirus  229E NOT DETECTED NOT DETECTED Final    Comment: (NOTE) The Coronavirus on the Respiratory Panel, DOES NOT test for the novel  Coronavirus (2019 nCoV)    Coronavirus HKU1 NOT DETECTED NOT DETECTED Final   Coronavirus NL63 NOT DETECTED NOT DETECTED Final   Coronavirus OC43 NOT DETECTED NOT  DETECTED Final   Metapneumovirus NOT DETECTED NOT DETECTED Final   Rhinovirus / Enterovirus NOT DETECTED NOT DETECTED Final   Influenza A NOT DETECTED NOT DETECTED Final   Influenza B NOT DETECTED NOT DETECTED Final   Parainfluenza Virus 1 NOT DETECTED NOT DETECTED Final   Parainfluenza Virus 2 NOT DETECTED NOT DETECTED Final   Parainfluenza Virus 3 DETECTED (A) NOT DETECTED Final   Parainfluenza Virus 4 NOT DETECTED NOT DETECTED Final   Respiratory Syncytial Virus NOT DETECTED NOT DETECTED Final   Bordetella pertussis NOT DETECTED NOT DETECTED Final   Bordetella Parapertussis NOT DETECTED NOT DETECTED Final   Chlamydophila pneumoniae NOT DETECTED NOT DETECTED Final   Mycoplasma pneumoniae NOT DETECTED NOT DETECTED Final    Comment: Performed at [facility], [place] [address], [place], [place] [address]  Expectorated Sputum Assessment w Gram Stain, Rflx to Resp Cult     Status: None   Collection Time: 07/22/20  3:28 PM   Specimen: Expectorated Sputum  Result Value Ref Range Status   Specimen Description EXPECTORATED SPUTUM  Final   Special Requests NONE  Final   Sputum evaluation   Final    THIS SPECIMEN IS ACCEPTABLE FOR SPUTUM CULTURE Performed at [facility], [address], [place], [place] [address]    Report Status 07/22/2020 FINAL  Final  Culture, Respiratory w Gram Stain     Status: None (Preliminary result)   Collection Time: 07/22/20  3:28 PM  Result Value Ref Range Status   Specimen Description   Final    EXPECTORATED SPUTUM Performed at [facility], [address], [place], [place] [address]    Special Requests   Final    NONE Reflexed from  [id number] Performed at [facility], [address] [place], [place] [address]    Gram Stain   Final    RARE WBC PRESENT,BOTH PMN AND MONONUCLEAR FEW YEAST RARE GRAM POSITIVE COCCI RARE GRAM POSITIVE RODS    Culture   Final    CULTURE REINCUBATED FOR BETTER GROWTH Performed at [facility], [place] [address], [place], [place] [address]    Report Status PENDING  Incomplete     Radiology Studies: No results found.   Marzetta Board, MD, PhD [facility]  Between 7 am - 7 pm I am available, please contact me via Amion (for emergencies) or Securechat (non urgent messages)  Between 7 pm - 7 am I am not available, please contact night coverage MD/APP via Amion

## 2020-07-24 NOTE — Progress Notes (Signed)
Follow up -Pulmonary/Critical Care Medicine Note  Follow-up: Acute respiratory failure in the setting of Parainfluenza 3 pneumonia  Patient Details:    Aaron Burton is an 87 y.o. male lifelong never smoker, admitted on 17 July 2020 with increasing shortness of breath, cough fevers and chills.  He has bilateral infiltrates consistent with pneumonia.  Nasopharyngeal viral panel swab showed parainfluenza 3 positivity.  Patient on high flow O2.  Lines, Airways, Drains: External Urinary Catheter (Active)  Collection Container Standard drainage bag 07/24/20 0750  Suction (Verified suction is between 40-80 mmHg) N/A (Patient has condom catheter) 07/24/20 0750  Securement Method Securing device (Describe) 07/24/20 0750  Site Assessment Clean;Intact 07/24/20 0750  Intervention Equipment Changed 07/23/20 2102  Output (mL) 700 mL 07/24/20 1030   Scheduled Meds:  apixaban  2.5 mg Oral BID   arformoterol  15 mcg Nebulization BID   azithromycin  500 mg Oral Daily   budesonide (PULMICORT) nebulizer solution  0.25 mg Nebulization BID   cholecalciferol  5,000 Units Oral Once per day on Mon Thu   [START ON 07/25/2020] furosemide  40 mg Intravenous Daily   gabapentin  600 mg Oral QHS   guaiFENesin  1,200 mg Oral BID   metoprolol succinate  25 mg Oral Daily   multivitamin with minerals  1 tablet Oral Daily   simvastatin  20 mg Oral QPM   Continuous Infusions:  sodium chloride Stopped (07/20/20 0713)   PRN Meds:.sodium chloride, acetaminophen, albuterol, hydrALAZINE, morphine injection, ondansetron (ZOFRAN) IV, senna-docusate   Microbiology: Results for orders placed or performed during the hospital encounter of 07/30/2020  Blood culture (routine single)     Status: None   Collection Time: 08/09/2020  8:15 AM   Specimen: BLOOD  Result Value Ref Range Status   Specimen Description BLOOD RIGHT ANTECUBITAL  Final   Special Requests   Final    BOTTLES DRAWN AEROBIC AND ANAEROBIC Blood Culture adequate  volume   Culture   Final    NO GROWTH 6 DAYS Performed at [facility], [address] [place], [place] [address]    Report Status 07/23/2020 FINAL  Final  Urine culture     Status: None   Collection Time: 08/01/2020  8:15 AM   Specimen: In/Out Cath Urine  Result Value Ref Range Status   Specimen Description   Final    IN/OUT CATH URINE Performed at [facility], [address] [place], [place] [address]    Special Requests   Final    NONE Performed at [facility], [address] [place], [place] [address]    Culture   Final    NO GROWTH Performed at [facility], [place] [address], [place], [place] [address]    Report Status 07/18/2020 FINAL  Final  Resp Panel by RT-PCR (Flu A&B, Covid)     Status: None   Collection Time: 08/10/2020  8:15 AM   Specimen: Nasopharyngeal(NP) swabs in vial transport medium  Result Value Ref Range Status   SARS Coronavirus 2 by RT PCR NEGATIVE NEGATIVE Final    Comment: (NOTE) SARS-CoV-2 target nucleic acids are NOT DETECTED.  The SARS-CoV-2 RNA is generally detectable in upper respiratory specimens during the acute phase of infection. The lowest concentration of SARS-CoV-2 viral copies this assay can detect is 138 copies/mL. A negative result does not preclude SARS-Cov-2 infection and should not be used as the sole basis for treatment or other patient management decisions. A negative result may occur with  improper specimen collection/handling, submission of specimen other than nasopharyngeal swab, presence of viral mutation(s) within the areas targeted by this assay, and inadequate number of viral copies(<138 copies/mL). A negative result must be combined with clinical observations, patient history, and epidemiological information. The expected result is Negative.  Fact Sheet for Patients:  EntrepreneurPulse.com.au  Fact Sheet for Healthcare Providers:   IncredibleEmployment.be  This test is no t yet approved or cleared by the [place] FDA and  has been authorized for detection and/or diagnosis of SARS-CoV-2 by FDA under an Emergency Use Authorization (EUA). This EUA will remain  in effect (meaning this test can be used) for the duration of the COVID-19 declaration under Section 564(b)(1) of the Act, 21 U.S.C.section 360bbb-3(b)(1), unless the authorization is terminated  or revoked sooner.       Influenza A by PCR NEGATIVE NEGATIVE Final   Influenza B by PCR NEGATIVE NEGATIVE Final    Comment: (NOTE) The Xpert Xpress SARS-CoV-2/FLU/RSV plus assay is intended as an aid in the diagnosis of influenza from Nasopharyngeal swab specimens and should not be used as a sole basis for treatment. Nasal washings and aspirates are unacceptable for Xpert Xpress SARS-CoV-2/FLU/RSV testing.  Fact Sheet for Patients: EntrepreneurPulse.com.au  Fact Sheet for Healthcare Providers: IncredibleEmployment.be  This test is not yet approved or cleared by the [place] FDA and has been authorized for detection and/or diagnosis of SARS-CoV-2 by FDA under an Emergency Use Authorization (EUA). This EUA will remain in effect (meaning this test can be used) for the duration of the COVID-19 declaration under Section 564(b)(1) of the Act, 21 U.S.C. section 360bbb-3(b)(1), unless the authorization is terminated or revoked.  Performed at [facility], [address] [place], [place] [address]   MRSA PCR Screening     Status: None   Collection Time: 07/22/20 12:14 PM   Specimen: Nasal Mucosa; Nasopharyngeal  Result Value Ref Range Status   MRSA by PCR NEGATIVE NEGATIVE Final    Comment:        The GeneXpert MRSA Assay (FDA approved for NASAL specimens only), is one component of a comprehensive MRSA colonization surveillance program. It is not intended to diagnose MRSA infection  nor to guide or monitor treatment for MRSA infections. Performed at [facility], [place], [place] [address]   Respiratory (~20 pathogens) panel by PCR     Status: Abnormal   Collection Time: 07/22/20 12:14 PM   Specimen: Nasopharyngeal Swab; Respiratory  Result Value Ref Range Status   Adenovirus NOT DETECTED NOT DETECTED Final   Coronavirus 229E NOT DETECTED NOT DETECTED Final    Comment: (NOTE) The Coronavirus on the Respiratory Panel, DOES NOT test for the novel  Coronavirus (2019 nCoV)    Coronavirus HKU1 NOT DETECTED NOT DETECTED Final   Coronavirus NL63 NOT DETECTED NOT DETECTED Final   Coronavirus OC43 NOT DETECTED NOT DETECTED Final   Metapneumovirus NOT DETECTED NOT DETECTED Final   Rhinovirus / Enterovirus NOT DETECTED NOT DETECTED Final   Influenza A NOT DETECTED NOT DETECTED Final   Influenza B NOT DETECTED NOT DETECTED Final   Parainfluenza Virus 1 NOT DETECTED NOT DETECTED Final   Parainfluenza Virus 2 NOT DETECTED NOT DETECTED Final   Parainfluenza Virus 3 DETECTED (A) NOT DETECTED Final   Parainfluenza Virus 4 NOT DETECTED NOT DETECTED Final   Respiratory Syncytial Virus NOT DETECTED NOT DETECTED Final   Bordetella pertussis NOT DETECTED NOT DETECTED Final   Bordetella Parapertussis NOT DETECTED NOT DETECTED Final  Chlamydophila pneumoniae NOT DETECTED NOT DETECTED Final   Mycoplasma pneumoniae NOT DETECTED NOT DETECTED Final    Comment: Performed at [facility], [place] [address] [place], [place] [address]  Expectorated Sputum Assessment w Gram Stain, Rflx to Resp Cult     Status: None   Collection Time: 07/22/20  3:28 PM   Specimen: Expectorated Sputum  Result Value Ref Range Status   Specimen Description EXPECTORATED SPUTUM  Final   Special Requests NONE  Final   Sputum evaluation   Final    THIS SPECIMEN IS ACCEPTABLE FOR SPUTUM CULTURE Performed at [facility], [address] [place], [place] [address]     Report Status 07/22/2020 FINAL  Final  Culture, Respiratory w Gram Stain     Status: None (Preliminary result)   Collection Time: 07/22/20  3:28 PM  Result Value Ref Range Status   Specimen Description   Final    EXPECTORATED SPUTUM Performed at [facility], [address], [place], [place] [address]    Special Requests   Final    NONE Reflexed from [id number] Performed at [facility], [address] [place], [place] [address]    Gram Stain   Final    RARE WBC PRESENT,BOTH PMN AND MONONUCLEAR FEW YEAST RARE GRAM POSITIVE COCCI RARE GRAM POSITIVE RODS    Culture   Final    MODERATE YEAST CULTURE REINCUBATED FOR BETTER GROWTH Performed at [facility], [place] [address] [place],  [address]    Report Status PENDING  Incomplete    Best Practice/Protocols:  VTE Prophylaxis: Direct Thrombin Inhibitor   Studies: DG Chest 2 View  Result Date: 07/15/2020 CLINICAL DATA:  Productive cough. EXAM: CHEST - 2 VIEW COMPARISON:  January 24, 2020. FINDINGS: Similar streaky/linear left basilar/retrocardiac opacities. No new consolidation. Previously seen interstitial opacities are improved. Scattered calcified granulomas. No visible pneumothorax or pleural effusion. Biapical pleuroparenchymal scarring. Chronic L2 compression fracture. Calcific atherosclerosis. Similar cardiomediastinal silhouette. IMPRESSION: Similar streaky/linear left basilar/retrocardiac opacities. The stability of this finding favors atelectasis/scar over infection. Electronically Signed   By: Margaretha Sheffield MD   On: 07/15/2020 11:03   CT CHEST WO CONTRAST  Result Date: 07/21/2020 CLINICAL DATA:  Worsening shortness of breath and leukocytosis. EXAM: CT CHEST WITHOUT CONTRAST TECHNIQUE: Multidetector CT imaging of the chest was performed following the standard protocol without IV contrast. COMPARISON:  08/03/2020 FINDINGS: Cardiovascular: Aortic atherosclerosis. Moderate cardiomegaly, without  pericardial effusion. Three vessel coronary artery calcification. Aortic valve calcification is nonspecific in this age group. Pulmonary artery enlargement, outflow tract 3.1 cm. Mediastinum/Nodes: Right paratracheal node maintains its fatty hilum and measures 1.1 cm on 47/2, similar. Node within the azygoesophageal recess measures 1.5 cm on 69/2 and is enlarged from approximately 9 mm on the prior. Hilar regions poorly evaluated without intravenous contrast. Lungs/Pleura: Trace left pleural fluid, similar. Right pleural effusion is nearly completely resolved. Significantly worsened aeration. Relatively diffuse airspace and ground-glass opacity. No areas of extraalveolar air to suggest necrosis. Relative sparing of the left lower lobe. Upper Abdomen: Normal imaged portions of the liver, spleen, stomach, pancreas, adrenal glands. Upper pole 5 mm right renal collecting system calculus. Punctate upper pole left renal collecting system calculi. Upper pole left renal low-density 11 mm lesion is likely a cyst. Musculoskeletal: Diffuse endoscopic skeletal hyperostosis throughout the spine. Mild osteopenia. IMPRESSION: 1. Worsened aeration with relatively diffuse interstitial and airspace disease, most consistent with progressive infection, including atypical etiologies. Not a typical appearance of COVID-19 pneumonia  which should be clinically excluded. 2. Persistent tiny left and near complete resolution of right-sided pleural effusion. 3. Coronary artery atherosclerosis. Aortic Atherosclerosis (ICD10-I70.0). 4. Pulmonary artery enlargement suggests pulmonary arterial hypertension. 5. Bilateral nephrolithiasis. 6. Mild thoracic adenopathy, favored to be reactive. Electronically Signed   By: Abigail Miyamoto M.D.   On: 07/21/2020 14:04   CT Angio Chest PE W and/or Wo Contrast  Result Date: 07/16/2020 CLINICAL DATA:  Cough and shortness of breath EXAM: CT ANGIOGRAPHY CHEST WITH CONTRAST TECHNIQUE: Multidetector CT imaging of  the chest was performed using the standard protocol during bolus administration of intravenous contrast. Multiplanar CT image reconstructions and MIPs were obtained to evaluate the vascular anatomy. CONTRAST:  78mL OMNIPAQUE IOHEXOL 350 MG/ML SOLN COMPARISON:  CT angiogram chest January 24, 2020; chest radiograph July 17, 2020 FINDINGS: Cardiovascular: There is no demonstrable pulmonary embolus. There is no appreciable thoracic aortic aneurysm or dissection. There are scattered foci of calcification in visualized great vessels. There are foci of aortic atherosclerosis. There are multiple foci coronary artery calcification. There is no pericardial effusion or pericardial thickening. Mediastinum/Nodes: No thyroid lesion evident. No appreciable thoracic adenopathy by size criteria. No esophageal lesions. Lungs/Pleura: There is a pleural effusion on each side, slightly larger on the left than the right. There are foci of airspace consolidation in the left lower lobe. There is also atelectatic change in each lower lobe and inferior lingular region. In the upper lobes posteriorly, there is patchy infiltrate on each side. Trachea and major bronchial structures appear normal. No evident pneumothorax. Upper Abdomen: Visualized upper abdominal structures appear unremarkable Musculoskeletal: There is degenerative change in the thoracic spine with diffuse idiopathic skeletal hyperostosis. There is degenerative change in each shoulder. No blastic or lytic bone lesions. No appreciable chest wall lesions. Review of the MIP images confirms the above findings. IMPRESSION: 1. No evident pulmonary embolus. No thoracic aortic aneurysm or dissection. There is aortic atherosclerosis as well as foci of great vessel and coronary artery calcification. 2. Airspace opacity consistent with pneumonia in the left lower lobe as well as in each upper lobe, primarily posteriorly. Areas of atelectasis in each lower lobe and inferior lingula noted.  3. Small pleural effusions bilaterally, slightly larger on the left than on the right. 4.  No evident adenopathy. 5.  Diffuse idiopathic skeletal hyperostosis in the thoracic spine. Aortic Atherosclerosis (ICD10-I70.0). Electronically Signed   By: Lowella Grip III M.D.   On: 08/09/2020 11:16   US Venous Img Lower Bilateral (DVT)  Result Date: 08/02/2020 CLINICAL DATA:  Shortness of breath with positive D-dimer, concern for deep vein thrombosis. EXAM: BILATERAL LOWER EXTREMITY VENOUS DOPPLER ULTRASOUND TECHNIQUE: Gray-scale sonography with graded compression, as well as color Doppler and duplex ultrasound were performed to evaluate the lower extremity deep venous systems from the level of the common femoral vein and including the common femoral, femoral, profunda femoral, popliteal and calf veins including the posterior tibial, peroneal and gastrocnemius veins when visible. The superficial great saphenous vein was also interrogated. Spectral Doppler was utilized to evaluate flow at rest and with distal augmentation maneuvers in the common femoral, femoral and popliteal veins. COMPARISON:  None. FINDINGS: RIGHT LOWER EXTREMITY Common Femoral Vein: No evidence of thrombus. Normal compressibility, respiratory phasicity and response to augmentation. Saphenofemoral Junction: No evidence of thrombus. Normal compressibility and flow on color Doppler imaging. Profunda Femoral Vein: No evidence of thrombus. Normal compressibility and flow on color Doppler imaging. Femoral Vein: No evidence of thrombus. Normal compressibility, respiratory phasicity and response  to augmentation. Popliteal Vein: No evidence of thrombus. Normal compressibility, respiratory phasicity and response to augmentation. Calf Veins: No evidence of thrombus. Normal compressibility and flow on color Doppler imaging. Superficial Great Saphenous Vein: No evidence of thrombus. Normal compressibility. Venous Reflux:  None. Other Findings:  None. LEFT  LOWER EXTREMITY Common Femoral Vein: No evidence of thrombus. Normal compressibility, respiratory phasicity and response to augmentation. Saphenofemoral Junction: No evidence of thrombus. Normal compressibility and flow on color Doppler imaging. Profunda Femoral Vein: No evidence of thrombus. Normal compressibility and flow on color Doppler imaging. Femoral Vein: No evidence of thrombus. Normal compressibility, respiratory phasicity and response to augmentation. Popliteal Vein: No evidence of thrombus. Normal compressibility, respiratory phasicity and response to augmentation. Calf Veins: No evidence of thrombus. Normal compressibility and flow on color Doppler imaging. Superficial Great Saphenous Vein: No evidence of thrombus. Normal compressibility. Venous Reflux:  None. Other Findings:  None. IMPRESSION: No evidence of deep venous thrombosis in either lower extremity. Electronically Signed   By: Dahlia Bailiff MD   On: 07/26/2020 14:31   DG Chest Port 1 View  Result Date: 07/22/2020 CLINICAL DATA:  Acute respiratory failure with hypoxia EXAM: PORTABLE CHEST 1 VIEW COMPARISON:  07/30/2020 FINDINGS: Diffuse bilateral airspace disease, worsening since prior study. Cardiomegaly. No visible effusions or pneumothorax. No acute bony abnormality. IMPRESSION: Worsening bilateral airspace disease could reflect edema or infection. Electronically Signed   By: Rolm Baptise M.D.   On: 07/22/2020 22:53   DG Chest Port 1 View  Result Date: 07/29/2020 CLINICAL DATA:  Shortness of breath and wheezing EXAM: PORTABLE CHEST 1 VIEW COMPARISON:  July 15, 2020 FINDINGS: There is cardiomegaly with pulmonary venous hypertension. There is airspace opacity in the left lower lobe and to a lesser degree in the left upper lobe. There is also subtle ill-defined opacity right upper lobe. No adenopathy no bone lesions. IMPRESSION: Cardiomegaly with pulmonary vascular congestion. Airspace opacity in the left lower lobe as well as more subtly  in each upper lobe. Suspect multifocal pneumonia. Patchy pulmonary edema could present in this manner. Both edema and pneumonia may present concurrently. Note that atypical organism pneumonia could present in this manner. Check of COVID-19 status may be advised. Electronically Signed   By: Lowella Grip III M.D.   On: 08/02/2020 08:39   ECHOCARDIOGRAM COMPLETE  Result Date: 07/19/2020    ECHOCARDIOGRAM REPORT   Patient Name:   VERNIE PIET Date of Exam: 07/19/2020 Medical Rec #:  [id number]     Height:       66.0 in Accession #:    [id number]    Weight:       160.0 lb Date of Birth:  10/22/1928     BSA:          1.819 m Patient Age:    60 years      BP:           100/63 mmHg Patient Gender: M             HR:           63 bpm. Exam Location:  [facility] Procedure: 2D Echo, Cardiac Doppler and Color Doppler Indications:     Elevated Troponin  History:         Patient has no prior history of Echocardiogram examinations.                  CAD; Risk Factors:Hypertension. Pneumonia.  Sonographer:     Sherrie Sport RDCS (AE) Referring Phys:  [place] Diagnosing Phys: Isaias Cowman MD IMPRESSIONS  1. Left ventricular ejection fraction, by estimation, is 60 to 65%. The left ventricle has normal function. The left ventricle has no regional wall motion abnormalities. Left ventricular diastolic parameters are indeterminate.  2. Right ventricular systolic function is normal. The right ventricular size is normal.  3. The mitral valve is normal in structure. Moderate mitral valve regurgitation. No evidence of mitral stenosis.  4. Tricuspid valve regurgitation is mild to moderate.  5. The aortic valve is normal in structure. Aortic valve regurgitation is not visualized. Mild aortic valve sclerosis is present, with no evidence of aortic valve stenosis.  6. The inferior vena cava is normal in size with greater than 50% respiratory variability, suggesting right atrial pressure of 3 mmHg. FINDINGS  Left Ventricle: Left  ventricular ejection fraction, by estimation, is 60 to 65%. The left ventricle has normal function. The left ventricle has no regional wall motion abnormalities. The left ventricular internal cavity size was normal in size. There is  no left ventricular hypertrophy. Left ventricular diastolic parameters are indeterminate. Right Ventricle: The right ventricular size is normal. No increase in right ventricular wall thickness. Right ventricular systolic function is normal. Left Atrium: Left atrial size was normal in size. Right Atrium: Right atrial size was normal in size. Pericardium: There is no evidence of pericardial effusion. Mitral Valve: The mitral valve is normal in structure. Moderate mitral valve regurgitation. No evidence of mitral valve stenosis. Tricuspid Valve: The tricuspid valve is normal in structure. Tricuspid valve regurgitation is mild to moderate. No evidence of tricuspid stenosis. Aortic Valve: The aortic valve is normal in structure. Aortic valve regurgitation is not visualized. Mild aortic valve sclerosis is present, with no evidence of aortic valve stenosis. Aortic valve mean gradient measures 6.7 mmHg. Aortic valve peak gradient measures 12.2 mmHg. Aortic valve area, by VTI measures 2.92 cm. Pulmonic Valve: The pulmonic valve was normal in structure. Pulmonic valve regurgitation is not visualized. No evidence of pulmonic stenosis. Aorta: The aortic root is normal in size and structure. Venous: The inferior vena cava is normal in size with greater than 50% respiratory variability, suggesting right atrial pressure of 3 mmHg. IAS/Shunts: No atrial level shunt detected by color flow Doppler.  LEFT VENTRICLE PLAX 2D LVIDd:         3.42 cm LVIDs:         2.43 cm LV PW:         1.72 cm LV IVS:        0.80 cm LVOT diam:     2.00 cm LV SV:         79 LV SV Index:   44 LVOT Area:     3.14 cm  RIGHT VENTRICLE RV Basal diam:  2.88 cm RV S prime:     11.60 cm/s TAPSE (M-mode): 3.7 cm LEFT ATRIUM            Index       RIGHT ATRIUM           Index LA diam:      4.60 cm 2.53 cm/m  RA Area:     13.80 cm LA Vol (A4C): 70.0 ml 38.49 ml/m RA Volume:   29.40 ml  16.16 ml/m  AORTIC VALVE                    PULMONIC VALVE AV Area (Vmax):    2.94 cm     PV Vmax:  0.96 m/s AV Area (Vmean):   2.68 cm     PV Peak grad:   3.7 mmHg AV Area (VTI):     2.92 cm     RVOT Peak grad: 3 mmHg AV Vmax:           175.00 cm/s AV Vmean:          116.967 cm/s AV VTI:            0.271 m AV Peak Grad:      12.2 mmHg AV Mean Grad:      6.7 mmHg LVOT Vmax:         164.00 cm/s LVOT Vmean:        99.800 cm/s LVOT VTI:          0.252 m LVOT/AV VTI ratio: 0.93  AORTA Ao Root diam: 3.30 cm MITRAL VALVE                TRICUSPID VALVE MV Area (PHT): 3.89 cm     TR Peak grad:   46.8 mmHg MV Decel Time: 195 msec     TR Vmax:        342.00 cm/s MV E velocity: 128.00 cm/s                             SHUNTS                             Systemic VTI:  0.25 m                             Systemic Diam: 2.00 cm Isaias Cowman MD Electronically signed by Isaias Cowman MD Signature Date/Time: 07/19/2020/4:14:01 PM    Final     Consults: Treatment Team:  Tyler Pita, MD   Subjective:    Overnight Issues:  Continues high FiO2 requirement on high flow O2.  Does not tolerate BiPAP.  Overall feels that he is mobilizing secretions better.  Still very short of breath but appears less tachypneic.  Started MetaNeb yesterday.  ROS: A 10 point review of systems was performed and it is as noted above otherwise negative. Objective:  Vital signs for last 24 hours: Temp:  [97.8 F (36.6 C)-98.8 F (37.1 C)] 97.8 F (36.6 C) (06/12 1551) Pulse Rate:  [69-89] 89 (06/12 1551) Resp:  [18-25] 18 (06/12 1551) BP: (123-156)/(55-119) 131/58 (06/12 1551) SpO2:  [88 %-100 %] 100 % (06/12 1551) FiO2 (%):  [96 %-100 %] 100 % (06/12 0855) Weight:  [74.9 kg] 74.9 kg (06/12 0446)   Intake/Output from previous day: 06/11 0701 - 06/12  0700 In: 1060 [P.O.:1060] Out: 4300 [Urine:4300]  Intake/Output this shift: Total I/O In: -  Out: 700 [Urine:700]  Oxygen requirement for last 24 hours: FiO2 (%):  [96 %-100 %] 100 %  Physical Exam:  GENERAL: Elderly gentleman, no conversational dyspnea.  Awake and alert.  On high flow nasal cannula. HEAD: Normocephalic, atraumatic.  EYES: Pupils equal, round, reactive to light.  No scleral icterus.  MOUTH: Oral mucosa moist.  No thrush. NECK: Supple. No thyromegaly. Trachea midline. No JVD.  No adenopathy. PULMONARY: Good air entry bilaterally.  Coarse breath sounds, otherwise no adventitious sounds.  CARDIOVASCULAR: S1 and S2. Regular rate and rhythm.  Grade 2/6 systolic ejection murmur left sternal border. ABDOMEN: Nondistended, normoactive bowel sounds, soft, nontender. MUSCULOSKELETAL: No  joint deformity, no clubbing, no edema.  NEUROLOGIC: No overt focal deficit. SKIN: Intact,warm,dry. PSYCH: Anxious, behavior appropriate.  Assessment/Plan:   Acute respiratory failure with hypoxia due to parainfluenza 3 pneumonia Acute lung injury/organizing pneumonia Bronchospasm due to parainfluenza 3 - resolved Continue oxygen supplementation to keep oxygen saturations between 88 to 92% Pulmonary hygiene MetaNeb twice daily Continue Brovana via neb twice daily Continue budesonide via neb twice daily Albuterol as needed Unfortunately no antivirals that can treat parainfluenza 3 Elderly are at risk for more severe disease Prognosis is guarded   Leukocytosis Likely due to steroids Resolving with discontinuation of IV steroids  Acute on chronic kidney injury Avoid nephrotoxins Trend renal panel Continue to monitor Consider holding Lasix    CODE STATUS: DNR     LOS: 7 days   Additional comments: Updated patient and wife at bedside.   Renold Don, MD [facility] 07/24/2020  *This note was dictated using voice recognition software/Dragon.  Despite best efforts  to proofread, errors can occur which can change the meaning.  Any change was purely unintentional.

## 2020-07-24 NOTE — Progress Notes (Signed)
Patient note to be in signifcant resp distress with cough. RN at bedside with morphine. Cpt deferred.

## 2020-07-25 ENCOUNTER — Encounter: Payer: Self-pay | Admitting: Internal Medicine

## 2020-07-25 DIAGNOSIS — Z7189 Other specified counseling: Secondary | ICD-10-CM

## 2020-07-25 DIAGNOSIS — Z515 Encounter for palliative care: Secondary | ICD-10-CM

## 2020-07-25 LAB — MISC LABCORP TEST (SEND OUT): Labcorp test code: 164068

## 2020-07-25 NOTE — Progress Notes (Signed)
PT Cancellation Note  Patient Details Name: Aaron Burton MRN: [id number] DOB: 10/27/1928   Cancelled Treatment:    Reason Eval/Treat Not Completed: Medical issues which prohibited therapy.  Chart reviewed.  Pt noted to have increasing O2 needs/respiratory concerns since initial PT evaluation (pt now on heated high flow).  Pt (+) parainfluenza 3.  Per discussion with pt's nurse, will hold PT at this time d/t pt's current respiratory status.  Will monitor pt's status and re-attempt PT treatment session at a later date/time as medically appropriate.  Leitha Bleak, PT 07/25/20, 11:29 AM

## 2020-07-25 NOTE — Progress Notes (Signed)
PROGRESS NOTE  MALEKI HIPPE FTD:[id number] DOB: [birth year] DOA: 07/25/2020 PCP: Dion Body, MD   LOS: 8 days   Brief Narrative / Interim history: 87 year old male with HTN, HLD, CAD, chronic kidney disease stage IIIb, chronic thrombocytopenia came into the hospital with cough, shortness of breath, fever and chills.  CT angiogram on admission was negative for PE but did show infiltrates in the left lower lobe and bilateral upper lobes.  He was febrile at 102.9, tachycardic, tachypneic.  He was also hypoxic on room air requiring supplemental oxygen.  Hospital course complicated by developing ARDS and progressive hypoxia.  Etiology is parainfluenza 3 virus  Subjective / 24h Interval events: Remains on heated high flow 100% FiO2, feels "okay"  Assessment & Plan: Principal Problem Acute hypoxic respiratory failure due to multifocal community-acquired pneumonia due to parainfluenza 3 virus, with ARDS-unfortunately continues to get worse, now on heated high flow.  His respiratory panel showed parainfluenza 3 virus, there is no FDA approved treatment for this but supportive care.  Suspect he may have had the original infection prior to coming to the hospital when his symptoms started couple of weeks ago -Appreciate ID and pulmonary involvement -Continue nebulizers, supportive treatment -Confirmed DNR/no intubation with the daughter -Palliative consulted as well his prognosis is extremely guarded at this point given extent of lung damage  Active Problems Elevated troponin, possible acute on chronic diastolic CHF-up to 062, trending down.  Cardiology consulted, felt to be demand ischemia. -Quite dry, hold Lasix  Elevated D-dimer -CT angio negative for PE, lower extremity Doppler without evidence of DVT  Coronary artery disease -no chest pain, continue Plavix, Zocor  Paroxysmal A. fib-noted on telemetry, cardiology started low-dose Eliquis and his Plavix was discontinued.  Continue current  regimen, no longer has hemoptysis  Thrombocytopenia-chronic, monitor  Chronic kidney disease stage IIIb -Baseline creatinine ranges 1.4-1.8, overall stable  Essential hypertension-continue metoprolol   Scheduled Meds:  apixaban  2.5 mg Oral BID   arformoterol  15 mcg Nebulization BID   azithromycin  500 mg Oral Daily   budesonide (PULMICORT) nebulizer solution  0.25 mg Nebulization BID   cholecalciferol  5,000 Units Oral Once per day on Mon Thu   furosemide  40 mg Intravenous Daily   gabapentin  600 mg Oral QHS   guaiFENesin  1,200 mg Oral BID   metoprolol succinate  25 mg Oral Daily   multivitamin with minerals  1 tablet Oral Daily   simvastatin  20 mg Oral QPM   Continuous Infusions:  sodium chloride Stopped (07/20/20 0713)   PRN Meds:.sodium chloride, acetaminophen, albuterol, hydrALAZINE, morphine injection, ondansetron (ZOFRAN) IV, senna-docusate  Diet Orders (From admission, onward)     Start     Ordered   07/19/2020 1153  Diet Heart Room service appropriate? Yes; Fluid consistency: Thin  Diet effective now       Question Answer Comment  Room service appropriate? Yes   Fluid consistency: Thin   Na restriction, if any: 2 gm Na      08/06/2020 1152            DVT prophylaxis: apixaban (ELIQUIS) tablet 2.5 mg Start: 07/19/20 2200 apixaban (ELIQUIS) tablet 2.5 mg     Code Status: DNR  Family Communication: Daughter at bedside  Status is: Inpatient  Remains inpatient appropriate because:Inpatient level of care appropriate due to severity of illness  Dispo: The patient is from: Home              Anticipated d/c is to:  Home              Patient currently is not medically stable to d/c.   Difficult to place patient No  Level of care: Progressive Cardiac  Consultants:  Cardiology  ID  Procedures:  2D echo  Microbiology  none  Antimicrobials: Status post a course of ceftriaxone and doxycycline Azithromycin 6/10 with plans for 5  days   Objective: Vitals:   07/25/20 0121 07/25/20 0432 07/25/20 0739 07/25/20 0816  BP: 135/69 117/79  132/75  Pulse:  85  71  Resp:  20  19  Temp:  98 F (36.7 C)  97.9 F (36.6 C)  TempSrc:  Axillary    SpO2:  99% 97% 95%  Weight:  71.3 kg    Height:        Intake/Output Summary (Last 24 hours) at 07/25/2020 0945 Last data filed at 07/25/2020 0500 Gross per 24 hour  Intake 120 ml  Output 3150 ml  Net -3030 ml    Filed Weights   07/23/20 0421 07/24/20 0446 07/25/20 0432  Weight: 75.1 kg 74.9 kg 71.3 kg    Examination:  Constitutional: More tachypneic and tired this morning Eyes: anicteric ENMT: mmm Neck: normal, supple Respiratory: tachypneic, rhonchi bilaterally  Cardiovascular: rrr, no edema Abdomen: soft, nt, nd, bs+ Musculoskeletal: no clubbing / cyanosis.  Skin: no rashes Neurologic: nonfocal   Data Reviewed: I have independently reviewed following labs and imaging studies   CBC: Recent Labs  Lab 07/20/20 0532 07/21/20 0745 07/22/20 0547 07/23/20 0553 07/24/20 0408  WBC 20.6* 35.0* 38.6* 27.5* 26.7*  HGB 9.8* 9.8* 9.6* 9.2* 9.0*  HCT 30.1* 29.7* 29.5* 28.7* 28.2*  MCV 86.5 86.8 86.5 87.5 88.4  PLT 101* 122* 123* 126* 112*    Basic Metabolic Panel: Recent Labs  Lab 07/20/20 0532 07/21/20 0745 07/22/20 0547 07/23/20 0553 07/24/20 0408  NA 139 140 142 140 140  K 4.3 3.9 4.4 4.2 3.8  CL 108 109 109 108 103  CO2 20* 21* 23 23 27   GLUCOSE 140* 131* 135* 121* 129*  BUN 50* 52* 57* 53* 56*  CREATININE 1.46* 1.61* 1.51* 1.61* 1.71*  CALCIUM 9.2 9.3 9.2 8.8* 8.6*  MG  --   --   --   --  1.9  PHOS  --   --   --   --  4.3    Liver Function Tests: Recent Labs  Lab 07/20/20 0532 07/21/20 0745 07/24/20 0408  AST 66* 50* 44*  ALT 54* 47* 38  ALKPHOS 66 75 122  BILITOT 0.6 0.6 1.4*  PROT 6.5 6.7 6.4*  ALBUMIN 2.8* 2.9* 2.5*    Coagulation Profile: No results for input(s): INR, PROTIME in the last 168 hours.  HbA1C: No results for  input(s): HGBA1C in the last 72 hours.  CBG: No results for input(s): GLUCAP in the last 168 hours.  Recent Results (from the past 240 hour(s))  Resp Panel by RT-PCR (Flu A&B, Covid) Nasopharyngeal Swab     Status: None   Collection Time: 07/15/20 10:14 AM   Specimen: Nasopharyngeal Swab; Nasopharyngeal(NP) swabs in vial transport medium  Result Value Ref Range Status   SARS Coronavirus 2 by RT PCR NEGATIVE NEGATIVE Final    Comment: (NOTE) SARS-CoV-2 target nucleic acids are NOT DETECTED.  The SARS-CoV-2 RNA is generally detectable in upper respiratory specimens during the acute phase of infection. The lowest concentration of SARS-CoV-2 viral copies this assay can detect is 138 copies/mL. A negative result does not  preclude SARS-Cov-2 infection and should not be used as the sole basis for treatment or other patient management decisions. A negative result may occur with  improper specimen collection/handling, submission of specimen other than nasopharyngeal swab, presence of viral mutation(s) within the areas targeted by this assay, and inadequate number of viral copies(<138 copies/mL). A negative result must be combined with clinical observations, patient history, and epidemiological information. The expected result is Negative.  Fact Sheet for Patients:  EntrepreneurPulse.com.au  Fact Sheet for Healthcare Providers:  IncredibleEmployment.be  This test is no t yet approved or cleared by the [place] FDA and  has been authorized for detection and/or diagnosis of SARS-CoV-2 by FDA under an Emergency Use Authorization (EUA). This EUA will remain  in effect (meaning this test can be used) for the duration of the COVID-19 declaration under Section 564(b)(1) of the Act, 21 U.S.C.section 360bbb-3(b)(1), unless the authorization is terminated  or revoked sooner.       Influenza A by PCR NEGATIVE NEGATIVE Final   Influenza B by PCR NEGATIVE  NEGATIVE Final    Comment: (NOTE) The Xpert Xpress SARS-CoV-2/FLU/RSV plus assay is intended as an aid in the diagnosis of influenza from Nasopharyngeal swab specimens and should not be used as a sole basis for treatment. Nasal washings and aspirates are unacceptable for Xpert Xpress SARS-CoV-2/FLU/RSV testing.  Fact Sheet for Patients: EntrepreneurPulse.com.au  Fact Sheet for Healthcare Providers: IncredibleEmployment.be  This test is not yet approved or cleared by the [place] FDA and has been authorized for detection and/or diagnosis of SARS-CoV-2 by FDA under an Emergency Use Authorization (EUA). This EUA will remain in effect (meaning this test can be used) for the duration of the COVID-19 declaration under Section 564(b)(1) of the Act, 21 U.S.C. section 360bbb-3(b)(1), unless the authorization is terminated or revoked.  Performed at [facility], [address], [place], [place] [address]   Blood culture (routine single)     Status: None   Collection Time: 07/15/2020  8:15 AM   Specimen: BLOOD  Result Value Ref Range Status   Specimen Description BLOOD RIGHT ANTECUBITAL  Final   Special Requests   Final    BOTTLES DRAWN AEROBIC AND ANAEROBIC Blood Culture adequate volume   Culture   Final    NO GROWTH 6 DAYS Performed at [facility], [address], [place], [place] [address]    Report Status 07/23/2020 FINAL  Final  Urine culture     Status: None   Collection Time: 07/25/2020  8:15 AM   Specimen: In/Out Cath Urine  Result Value Ref Range Status   Specimen Description   Final    IN/OUT CATH URINE Performed at [facility], [address], [place], [place] [address]    Special Requests   Final    NONE Performed at [facility], [address] [place], [place] [address]    Culture   Final    NO GROWTH Performed at [facility], [place] [address], [place], [place] [address]     Report Status 07/18/2020 FINAL  Final  Resp Panel by RT-PCR (Flu A&B, Covid)     Status: None   Collection Time: 08/10/2020  8:15 AM   Specimen: Nasopharyngeal(NP) swabs in vial transport medium  Result Value Ref Range Status   SARS Coronavirus 2 by RT PCR NEGATIVE NEGATIVE Final    Comment: (NOTE) SARS-CoV-2 target nucleic acids are NOT DETECTED.  The SARS-CoV-2 RNA is generally detectable in upper respiratory  specimens during the acute phase of infection. The lowest concentration of SARS-CoV-2 viral copies this assay can detect is 138 copies/mL. A negative result does not preclude SARS-Cov-2 infection and should not be used as the sole basis for treatment or other patient management decisions. A negative result may occur with  improper specimen collection/handling, submission of specimen other than nasopharyngeal swab, presence of viral mutation(s) within the areas targeted by this assay, and inadequate number of viral copies(<138 copies/mL). A negative result must be combined with clinical observations, patient history, and epidemiological information. The expected result is Negative.  Fact Sheet for Patients:  EntrepreneurPulse.com.au  Fact Sheet for Healthcare Providers:  IncredibleEmployment.be  This test is no t yet approved or cleared by the [place] FDA and  has been authorized for detection and/or diagnosis of SARS-CoV-2 by FDA under an Emergency Use Authorization (EUA). This EUA will remain  in effect (meaning this test can be used) for the duration of the COVID-19 declaration under Section 564(b)(1) of the Act, 21 U.S.C.section 360bbb-3(b)(1), unless the authorization is terminated  or revoked sooner.       Influenza A by PCR NEGATIVE NEGATIVE Final   Influenza B by PCR NEGATIVE NEGATIVE Final    Comment: (NOTE) The Xpert Xpress SARS-CoV-2/FLU/RSV plus assay is intended as an aid in the diagnosis of influenza from  Nasopharyngeal swab specimens and should not be used as a sole basis for treatment. Nasal washings and aspirates are unacceptable for Xpert Xpress SARS-CoV-2/FLU/RSV testing.  Fact Sheet for Patients: EntrepreneurPulse.com.au  Fact Sheet for Healthcare Providers: IncredibleEmployment.be  This test is not yet approved or cleared by the [place] FDA and has been authorized for detection and/or diagnosis of SARS-CoV-2 by FDA under an Emergency Use Authorization (EUA). This EUA will remain in effect (meaning this test can be used) for the duration of the COVID-19 declaration under Section 564(b)(1) of the Act, 21 U.S.C. section 360bbb-3(b)(1), unless the authorization is terminated or revoked.  Performed at [facility], [address] [place], [place] [address]   MRSA PCR Screening     Status: None   Collection Time: 07/22/20 12:14 PM   Specimen: Nasal Mucosa; Nasopharyngeal  Result Value Ref Range Status   MRSA by PCR NEGATIVE NEGATIVE Final    Comment:        The GeneXpert MRSA Assay (FDA approved for NASAL specimens only), is one component of a comprehensive MRSA colonization surveillance program. It is not intended to diagnose MRSA infection nor to guide or monitor treatment for MRSA infections. Performed at [facility], [place], [place] [address]   Respiratory (~20 pathogens) panel by PCR     Status: Abnormal   Collection Time: 07/22/20 12:14 PM   Specimen: Nasopharyngeal Swab; Respiratory  Result Value Ref Range Status   Adenovirus NOT DETECTED NOT DETECTED Final   Coronavirus 229E NOT DETECTED NOT DETECTED Final    Comment: (NOTE) The Coronavirus on the Respiratory Panel, DOES NOT test for the novel  Coronavirus (2019 nCoV)    Coronavirus HKU1 NOT DETECTED NOT DETECTED Final   Coronavirus NL63 NOT DETECTED NOT DETECTED Final   Coronavirus OC43 NOT DETECTED NOT DETECTED Final    Metapneumovirus NOT DETECTED NOT DETECTED Final   Rhinovirus / Enterovirus NOT DETECTED NOT DETECTED Final   Influenza A NOT DETECTED NOT DETECTED Final   Influenza B NOT DETECTED NOT DETECTED Final   Parainfluenza Virus 1 NOT DETECTED NOT DETECTED Final   Parainfluenza Virus 2 NOT  DETECTED NOT DETECTED Final   Parainfluenza Virus 3 DETECTED (A) NOT DETECTED Final   Parainfluenza Virus 4 NOT DETECTED NOT DETECTED Final   Respiratory Syncytial Virus NOT DETECTED NOT DETECTED Final   Bordetella pertussis NOT DETECTED NOT DETECTED Final   Bordetella Parapertussis NOT DETECTED NOT DETECTED Final   Chlamydophila pneumoniae NOT DETECTED NOT DETECTED Final   Mycoplasma pneumoniae NOT DETECTED NOT DETECTED Final    Comment: Performed at [facility], [place] [address] [place], [place] [address]  Expectorated Sputum Assessment w Gram Stain, Rflx to Resp Cult     Status: None   Collection Time: 07/22/20  3:28 PM   Specimen: Expectorated Sputum  Result Value Ref Range Status   Specimen Description EXPECTORATED SPUTUM  Final   Special Requests NONE  Final   Sputum evaluation   Final    THIS SPECIMEN IS ACCEPTABLE FOR SPUTUM CULTURE Performed at [facility], [address] [place], [place] [address]    Report Status 07/22/2020 FINAL  Final  Culture, Respiratory w Gram Stain     Status: None (Preliminary result)   Collection Time: 07/22/20  3:28 PM  Result Value Ref Range Status   Specimen Description   Final    EXPECTORATED SPUTUM Performed at [facility], [address] [place], [place] [address]    Special Requests   Final    NONE Reflexed from [id number] Performed at [facility], [address] [place], [place] [address]    Gram Stain   Final    RARE WBC PRESENT,BOTH PMN AND MONONUCLEAR FEW YEAST RARE GRAM POSITIVE COCCI RARE GRAM POSITIVE RODS    Culture   Final    MODERATE YEAST CULTURE REINCUBATED FOR BETTER GROWTH Performed at [facility], [place] [address], [place], [place] [address]    Report Status PENDING  Incomplete     Radiology Studies: No results found.   Marzetta Board, MD, PhD [facility]  Between 7 am - 7 pm I am available, please contact me via Amion (for emergencies) or Securechat (non urgent messages)  Between 7 pm - 7 am I am not available, please contact night coverage MD/APP via Amion

## 2020-07-25 NOTE — Consult Note (Addendum)
Consultation Note Date: 07/25/2020   Patient Name: Aaron Burton  DOB: [birth year]  MRN: [id number]  Age / Sex: 87 y.o., male  PCP: Dion Body, MD Referring Physician: Caren Griffins, MD  Reason for Consultation: Establishing goals of care and Psychosocial/spiritual support  HPI/Patient Profile: 88 y.o. male  with past medical history of hypertension, hyperlipidemia, GERD, gout, CAD, angioedema, CKD stage IIIb, thrombocytopenia admitted on 08/08/2020 with Acute hypoxic respiratory failure due to multifocal community-acquired pneumonia due to parainfluenza 3 virus, with ARDS.   Clinical Assessment and Goals of Care: I have reviewed medical records including EPIC notes, labs and imaging, received report from RN, assessed the patient and then met at the bedside along with daughter, Samuel Jester and her husband Abe People to discuss diagnosis prognosis, GOC, EOL wishes, disposition and options.   I introduced Palliative Medicine as specialized medical care for people living with serious illness. It focuses on providing relief from the symptoms and stress of a serious illness. The goal is to improve quality of life for both the patient and the family.  We discussed their current illness. The natural disease trajectory and expectations at EOL were discussed.  At this point, Mr. Tench states that he would not want to continue to use BiPAP, even if it meant he passed away without BiPAP.  I shared that we can respect this decision.  He does, however, want to continue his attempts for improvement.  We talk about his oxygen requirements, time for outcomes, possibility of sudden declines.  Patient and family are aware that Mr. Kuhlman could experience a sudden decline and passed away.  I attempted to elicit values and goals of care important to the patient.  Daughter Romie Minus shares that Mr. Haskell has been independent with ADLs/IADLs  until he fell ill last Thursday.  She tearfully shares that she believes that if he could not return to independence, he would not want to continue to live.  The difference between aggressive medical intervention and comfort care was considered in light of the patient's goals of care.  At this point, family would like time for outcomes/improvements.  Advanced directives, concepts specific to code status, were considered and discussed.   Discussed the importance of continued conversation with family and the medical providers regarding overall plan of care and treatment options, ensuring decisions are within the context of the patient's values and GOCs.    Questions and concerns were addressed.  The family was encouraged to call with questions or concerns.  PMT will continue to support holistically.  Conference with attending and bedside nursing staff related to patient condition, needs, goals of care. PMT to have a family meeting 6/14 at 68 AM at bedside.  HCPOA  NEXT OF KIN - son Jeneen Rinks and daughter Samuel Jester share HCPOA     SUMMARY OF RECOMMENDATIONS   Time for outcomes NO FURTHER BiPAP Liberalize morphine.    Code Status/Advance Care Planning: DNR  Symptom Management:  Per hospitalist, no additional needs at this time.   Palliative Prophylaxis:  Frequent Pain Assessment and Turn Reposition  Additional Recommendations (Limitations, Scope, Preferences): At this point continue to treat, time for outcomes. DNR/DNI, no further BiPAP.   Psycho-social/Spiritual:  Desire for further Chaplaincy support:no Additional Recommendations: Caregiving  Support/Resources and Education on Hospice  Prognosis:  Unable to determine, based on outcomes.   Discharge Planning:  to be determined, based on out       Primary Diagnoses: Present on Admission:  CAP (community acquired pneumonia)  Coronary artery disease  Hypercholesterolemia  Hypertension  Elevated troponin  Sepsis ([place])   Thrombocytopenia ([place])  CKD (chronic kidney disease), stage IIIb  Acute respiratory failure with hypoxia ([place])   I have reviewed the medical record, interviewed the patient and family, and examined the patient. The following aspects are pertinent.  Past Medical History:  Diagnosis Date   Coronary artery disease    GERD (gastroesophageal reflux disease)    Gout    Hypercholesterolemia    Hypertension    Pneumonia    Social History   Socioeconomic History   Marital status: Widowed    Spouse name: Not on file   Number of children: Not on file   Years of education: Not on file   Highest education level: Not on file  Occupational History   Not on file  Tobacco Use   Smoking status: Never   Smokeless tobacco: Never  Vaping Use   Vaping Use: Never used  Substance and Sexual Activity   Alcohol use: No   Drug use: No   Sexual activity: Not Currently  Other Topics Concern   Not on file  Social History Narrative   Not on file   Social Determinants of Health   Financial Resource Strain: Not on file  Food Insecurity: Not on file  Transportation Needs: Not on file  Physical Activity: Not on file  Stress: Not on file  Social Connections: Not on file   Family History  Problem Relation Age of Onset   Stroke Mother    Hypertension Father    Heart disease Father    Scheduled Meds:  apixaban  2.5 mg Oral BID   arformoterol  15 mcg Nebulization BID   azithromycin  500 mg Oral Daily   budesonide (PULMICORT) nebulizer solution  0.25 mg Nebulization BID   cholecalciferol  5,000 Units Oral Once per day on Mon Thu   gabapentin  600 mg Oral QHS   guaiFENesin  1,200 mg Oral BID   metoprolol succinate  25 mg Oral Daily   multivitamin with minerals  1 tablet Oral Daily   simvastatin  20 mg Oral QPM   Continuous Infusions:  sodium chloride Stopped (07/20/20 0713)   PRN Meds:.sodium chloride, acetaminophen, albuterol, hydrALAZINE, morphine injection, ondansetron (ZOFRAN) IV,  senna-docusate Medications Prior to Admission:  Prior to Admission medications   Medication Sig Start Date End Date Taking? Authorizing Provider  amLODipine (NORVASC) 2.5 MG tablet Take 1 tablet by mouth daily. 06/15/20  Yes [provider]  amLODipine (NORVASC) 5 MG tablet Take 1 tablet by mouth daily. 02/18/17  Yes [provider]  amoxicillin-clavulanate (AUGMENTIN) 875-125 MG tablet Take 1 tablet by mouth every 12 (twelve) hours. 07/15/20  Yes Margarette Canada, NP  azithromycin (ZITHROMAX Z-PAK) 250 MG tablet Take 1 tablet (250 mg total) by mouth daily. Take 2 tablets on the first day and then 1 tablet daily thereafter for a total of 5 days of treatment. 07/15/20  Yes Margarette Canada, NP  benzonatate (TESSALON) 100 MG capsule Take 2 capsules (  200 mg total) by mouth every 8 (eight) hours. 07/15/20  Yes Margarette Canada, NP  Cholecalciferol 125 MCG (5000 UT) TABS Take 5,000 Units by mouth 2 (two) times a week.   Yes [provider]  clopidogrel (PLAVIX) 75 MG tablet Take 1 tablet by mouth daily. 07/12/20  Yes [provider]  gabapentin (NEURONTIN) 300 MG capsule Take 2 capsules by mouth at bedtime. 07/12/20 07/12/21 Yes [provider]  Multiple Vitamin (MULTI-VITAMINS) TABS Take 1 tablet by mouth daily.   Yes [provider]  Multiple Vitamins-Minerals (PRESERVISION AREDS 2 PO) Take 1 tablet by mouth 2 (two) times daily.   Yes [provider]  promethazine-dextromethorphan (PROMETHAZINE-DM) 6.25-15 MG/5ML syrup Take 5 mLs by mouth 4 (four) times daily as needed. Patient taking differently: Take 5 mLs by mouth 4 (four) times daily as needed. 07/15/20  Yes Margarette Canada, NP  senna-docusate (SENOKOT-S) 8.6-50 MG tablet Take 0.5 tablets by mouth every evening.   Yes [provider]  simvastatin (ZOCOR) 20 MG tablet Take 20 mg by mouth every evening.    Yes [provider]  diphenhydrAMINE (BENADRYL) 25 mg capsule Take 1 capsule (25 mg total) by  mouth 3 (three) times daily for 5 days. 07/10/18 07/15/18  Fritzi Mandes, MD  famotidine (PEPCID) 10 MG tablet Take 1 tablet (10 mg total) by mouth 2 (two) times daily. Patient not taking: Reported on 08/11/2020 07/10/18   Fritzi Mandes, MD  HYDROcodone-acetaminophen (NORCO/VICODIN) 5-325 MG tablet Take 1 tablet by mouth every 6 (six) hours as needed for severe pain. Patient not taking: Reported on  01/24/20   Lavonia Drafts, MD  methocarbamol (ROBAXIN) 500 MG tablet Take 1 tablet (500 mg total) by mouth 2 (two) times daily. Patient not taking: Reported on 08/01/2020 03/02/20   Margarette Canada, NP  colchicine 0.6 MG tablet Take 1 tablet (0.6 mg total) by mouth daily as needed (gout). 07/10/18 12/26/19  Fritzi Mandes, MD   Allergies  Allergen Reactions   Ace Inhibitors Swelling    Facial swelling   Haemophilus Influenzae Vaccines     pna after receving   Review of Systems  Unable to perform ROS: Age   Physical Exam Constitutional:      General: He is not in acute distress.    Appearance: He is ill-appearing.  Neurological:     Mental Status: He is alert.    Vital Signs: BP 130/65 (BP Location: Right Arm)   Pulse 80   Temp 97.9 F (36.6 C)   Resp 18   Ht '5\' 6"'  (1.676 m)   Wt 71.3 kg   SpO2 96%   BMI 25.37 kg/m  Pain Scale: 0-10   Pain Score: 0-No pain   SpO2: SpO2: 96 % O2 Device:SpO2: 96 % O2 Flow Rate: .O2 Flow Rate (L/min): 50 L/min  IO: Intake/output summary:  Intake/Output Summary (Last 24 hours) at 07/25/2020 1332 Last data filed at 07/25/2020 1100 Gross per 24 hour  Intake 240 ml  Output 3050 ml  Net -2810 ml    LBM: Last BM Date: 07/22/20 Baseline Weight: Weight: 72.6 kg Most recent weight: Weight: 71.3 kg     Palliative Assessment/Data:   Flowsheet Rows    Flowsheet Row Most Recent Value  Intake Tab   Referral Department Hospitalist  Unit at Time of Referral Cardiac/Telemetry Unit  Palliative Care Primary Diagnosis Pulmonary  Date Notified 07/24/20   Palliative Care Type New Palliative care  Reason for referral Clarify Goals of Care  Date of  Admission 08/11/2020  Date first seen by Palliative Care 07/25/20  # of days Palliative referral response time 1 Day(s)  # of days IP prior to Palliative referral 7  Clinical Assessment   Palliative Performance Scale Score 40%  Pain Max last 24 hours Not able to report  Pain Min Last 24 hours Not able to report  Dyspnea Max Last 24 Hours Not able to report  Dyspnea Min Last 24 hours Not able to report  Psychosocial & Spiritual Assessment   Palliative Care Outcomes        Time In: 1100 Time Out: 1210 Time Total: 70 minutes  Greater than 50%  of this time was spent counseling and coordinating care related to the above assessment and plan.  Signed by: Drue Novel, NP   Please contact Palliative Medicine Team phone at [phone] for questions and concerns.  For individual provider: See Shea Evans

## 2020-07-25 NOTE — Progress Notes (Signed)
Follow up -Pulmonary/Critical Care Medicine Note  Follow-up: Acute respiratory failure in the setting of Parainfluenza 3 pneumonia  Patient Details:    Aaron Burton is an 87 y.o. male lifelong never smoker, admitted on 17 July 2020 with increasing shortness of breath, cough fevers and chills.  He has bilateral infiltrates consistent with pneumonia.  Nasopharyngeal viral panel swab showed parainfluenza 3 positivity.  Patient on high flow O2.  07/25/20- patient is with family.  He was resting in bed this am NAD.  He is meeting with palliative care and it seems family and patient are leaning towards non-aggressive therapy.  Ive met with ID Dr Ramon Dredge and we reviewed options for Ribavirin but it was explained that due to paucity of data in nanogenerian population we would offer only supportive care at this time. I agree with palliative and conservative measures with supportive care.   Lines, Airways, Drains: External Urinary Catheter (Active)  Collection Container Standard drainage bag 07/24/20 0750  Suction (Verified suction is between 40-80 mmHg) N/A (Patient has condom catheter) 07/24/20 0750  Securement Method Securing device (Describe) 07/24/20 0750  Site Assessment Clean;Intact 07/24/20 0750  Intervention Equipment Changed 07/23/20 2102  Output (mL) 700 mL 07/24/20 1030   Scheduled Meds:  apixaban  2.5 mg Oral BID   arformoterol  15 mcg Nebulization BID   azithromycin  500 mg Oral Daily   budesonide (PULMICORT) nebulizer solution  0.25 mg Nebulization BID   cholecalciferol  5,000 Units Oral Once per day on Mon Thu   furosemide  40 mg Intravenous Daily   gabapentin  600 mg Oral QHS   guaiFENesin  1,200 mg Oral BID   metoprolol succinate  25 mg Oral Daily   multivitamin with minerals  1 tablet Oral Daily   simvastatin  20 mg Oral QPM   Continuous Infusions:  sodium chloride Stopped (07/20/20 0713)   PRN Meds:.sodium chloride, acetaminophen, albuterol, hydrALAZINE, morphine  injection, ondansetron (ZOFRAN) IV, senna-docusate   Microbiology: Results for orders placed or performed during the hospital encounter of 07/28/2020  Blood culture (routine single)     Status: None   Collection Time: 07/30/2020  8:15 AM   Specimen: BLOOD  Result Value Ref Range Status   Specimen Description BLOOD RIGHT ANTECUBITAL  Final   Special Requests   Final    BOTTLES DRAWN AEROBIC AND ANAEROBIC Blood Culture adequate volume   Culture   Final    NO GROWTH 6 DAYS Performed at [facility], [address] [place], [place] [address]    Report Status 07/23/2020 FINAL  Final  Urine culture     Status: None   Collection Time: 08/04/2020  8:15 AM   Specimen: In/Out Cath Urine  Result Value Ref Range Status   Specimen Description   Final    IN/OUT CATH URINE Performed at [facility], [address] [place], [place] [address]    Special Requests   Final    NONE Performed at [facility], [address] [place], [place] [address]    Culture   Final    NO GROWTH Performed at [facility], [place] [address], [place], [place] [address]    Report Status 07/18/2020 FINAL  Final  Resp Panel by RT-PCR (Flu A&B, Covid)     Status: None   Collection Time: 08/06/2020  8:15 AM   Specimen: Nasopharyngeal(NP) swabs in vial transport medium  Result Value Ref Range Status   SARS Coronavirus 2 by RT PCR NEGATIVE  NEGATIVE Final    Comment: (NOTE) SARS-CoV-2 target nucleic acids are NOT DETECTED.  The SARS-CoV-2 RNA is generally detectable in upper respiratory specimens during the acute phase of infection. The lowest concentration of SARS-CoV-2 viral copies this assay can detect is 138 copies/mL. A negative result does not preclude SARS-Cov-2 infection and should not be used as the sole basis for treatment or other patient management decisions. A negative result may occur with  improper specimen collection/handling, submission of specimen other than  nasopharyngeal swab, presence of viral mutation(s) within the areas targeted by this assay, and inadequate number of viral copies(<138 copies/mL). A negative result must be combined with clinical observations, patient history, and epidemiological information. The expected result is Negative.  Fact Sheet for Patients:  EntrepreneurPulse.com.au  Fact Sheet for Healthcare Providers:  IncredibleEmployment.be  This test is no t yet approved or cleared by the [place] FDA and  has been authorized for detection and/or diagnosis of SARS-CoV-2 by FDA under an Emergency Use Authorization (EUA). This EUA will remain  in effect (meaning this test can be used) for the duration of the COVID-19 declaration under Section 564(b)(1) of the Act, 21 U.S.C.section 360bbb-3(b)(1), unless the authorization is terminated  or revoked sooner.       Influenza A by PCR NEGATIVE NEGATIVE Final   Influenza B by PCR NEGATIVE NEGATIVE Final    Comment: (NOTE) The Xpert Xpress SARS-CoV-2/FLU/RSV plus assay is intended as an aid in the diagnosis of influenza from Nasopharyngeal swab specimens and should not be used as a sole basis for treatment. Nasal washings and aspirates are unacceptable for Xpert Xpress SARS-CoV-2/FLU/RSV testing.  Fact Sheet for Patients: EntrepreneurPulse.com.au  Fact Sheet for Healthcare Providers: IncredibleEmployment.be  This test is not yet approved or cleared by the [place] FDA and has been authorized for detection and/or diagnosis of SARS-CoV-2 by FDA under an Emergency Use Authorization (EUA). This EUA will remain in effect (meaning this test can be used) for the duration of the COVID-19 declaration under Section 564(b)(1) of the Act, 21 U.S.C. section 360bbb-3(b)(1), unless the authorization is terminated or revoked.  Performed at [facility], [address] [place], [place]  [address]   MRSA PCR Screening     Status: None   Collection Time: 07/22/20 12:14 PM   Specimen: Nasal Mucosa; Nasopharyngeal  Result Value Ref Range Status   MRSA by PCR NEGATIVE NEGATIVE Final    Comment:        The GeneXpert MRSA Assay (FDA approved for NASAL specimens only), is one component of a comprehensive MRSA colonization surveillance program. It is not intended to diagnose MRSA infection nor to guide or monitor treatment for MRSA infections. Performed at [facility], [place], [place] [address]   Respiratory (~20 pathogens) panel by PCR     Status: Abnormal   Collection Time: 07/22/20 12:14 PM   Specimen: Nasopharyngeal Swab; Respiratory  Result Value Ref Range Status   Adenovirus NOT DETECTED NOT DETECTED Final   Coronavirus 229E NOT DETECTED NOT DETECTED Final    Comment: (NOTE) The Coronavirus on the Respiratory Panel, DOES NOT test for the novel  Coronavirus (2019 nCoV)    Coronavirus HKU1 NOT DETECTED NOT DETECTED Final   Coronavirus NL63 NOT DETECTED NOT DETECTED Final   Coronavirus OC43 NOT DETECTED NOT DETECTED Final   Metapneumovirus NOT DETECTED NOT DETECTED Final   Rhinovirus / Enterovirus NOT DETECTED NOT DETECTED Final   Influenza A NOT DETECTED NOT DETECTED Final  Influenza B NOT DETECTED NOT DETECTED Final   Parainfluenza Virus 1 NOT DETECTED NOT DETECTED Final   Parainfluenza Virus 2 NOT DETECTED NOT DETECTED Final   Parainfluenza Virus 3 DETECTED (A) NOT DETECTED Final   Parainfluenza Virus 4 NOT DETECTED NOT DETECTED Final   Respiratory Syncytial Virus NOT DETECTED NOT DETECTED Final   Bordetella pertussis NOT DETECTED NOT DETECTED Final   Bordetella Parapertussis NOT DETECTED NOT DETECTED Final   Chlamydophila pneumoniae NOT DETECTED NOT DETECTED Final   Mycoplasma pneumoniae NOT DETECTED NOT DETECTED Final    Comment: Performed at [facility], [place] [address] [place], [place] [address]  Expectorated Sputum  Assessment w Gram Stain, Rflx to Resp Cult     Status: None   Collection Time: 07/22/20  3:28 PM   Specimen: Expectorated Sputum  Result Value Ref Range Status   Specimen Description EXPECTORATED SPUTUM  Final   Special Requests NONE  Final   Sputum evaluation   Final    THIS SPECIMEN IS ACCEPTABLE FOR SPUTUM CULTURE Performed at [facility], [address] [place], [place] [address]    Report Status 07/22/2020 FINAL  Final  Culture, Respiratory w Gram Stain     Status: None (Preliminary result)   Collection Time: 07/22/20  3:28 PM  Result Value Ref Range Status   Specimen Description   Final    EXPECTORATED SPUTUM Performed at [facility], [address], [place], [place] [address]    Special Requests   Final    NONE Reflexed from [id number] Performed at [facility], [address] [place], [place] [address]    Gram Stain   Final    RARE WBC PRESENT,BOTH PMN AND MONONUCLEAR FEW YEAST RARE GRAM POSITIVE COCCI RARE GRAM POSITIVE RODS    Culture   Final    MODERATE YEAST CULTURE REINCUBATED FOR BETTER GROWTH Performed at [facility], [place] [address], [place], [place] [address]    Report Status PENDING  Incomplete    Best Practice/Protocols:  VTE Prophylaxis: Direct Thrombin Inhibitor   Studies: DG Chest 2 View  Result Date: 07/15/2020 CLINICAL DATA:  Productive cough. EXAM: CHEST - 2 VIEW COMPARISON:  January 24, 2020. FINDINGS: Similar streaky/linear left basilar/retrocardiac opacities. No new consolidation. Previously seen interstitial opacities are improved. Scattered calcified granulomas. No visible pneumothorax or pleural effusion. Biapical pleuroparenchymal scarring. Chronic L2 compression fracture. Calcific atherosclerosis. Similar cardiomediastinal silhouette. IMPRESSION: Similar streaky/linear left basilar/retrocardiac opacities. The stability of this finding favors atelectasis/scar over infection. Electronically Signed   By:  Margaretha Sheffield MD   On: 07/15/2020 11:03   CT CHEST WO CONTRAST  Result Date: 07/21/2020 CLINICAL DATA:  Worsening shortness of breath and leukocytosis. EXAM: CT CHEST WITHOUT CONTRAST TECHNIQUE: Multidetector CT imaging of the chest was performed following the standard protocol without IV contrast. COMPARISON:  08/05/2020 FINDINGS: Cardiovascular: Aortic atherosclerosis. Moderate cardiomegaly, without pericardial effusion. Three vessel coronary artery calcification. Aortic valve calcification is nonspecific in this age group. Pulmonary artery enlargement, outflow tract 3.1 cm. Mediastinum/Nodes: Right paratracheal node maintains its fatty hilum and measures 1.1 cm on 47/2, similar. Node within the azygoesophageal recess measures 1.5 cm on 69/2 and is enlarged from approximately 9 mm on the prior. Hilar regions poorly evaluated without intravenous contrast. Lungs/Pleura: Trace left pleural fluid, similar. Right pleural effusion is nearly completely resolved. Significantly worsened aeration. Relatively diffuse airspace and ground-glass opacity. No areas of extraalveolar air to suggest necrosis. Relative sparing of the left lower lobe. Upper  Abdomen: Normal imaged portions of the liver, spleen, stomach, pancreas, adrenal glands. Upper pole 5 mm right renal collecting system calculus. Punctate upper pole left renal collecting system calculi. Upper pole left renal low-density 11 mm lesion is likely a cyst. Musculoskeletal: Diffuse endoscopic skeletal hyperostosis throughout the spine. Mild osteopenia. IMPRESSION: 1. Worsened aeration with relatively diffuse interstitial and airspace disease, most consistent with progressive infection, including atypical etiologies. Not a typical appearance of COVID-19 pneumonia which should be clinically excluded. 2. Persistent tiny left and near complete resolution of right-sided pleural effusion. 3. Coronary artery atherosclerosis. Aortic Atherosclerosis (ICD10-I70.0). 4.  Pulmonary artery enlargement suggests pulmonary arterial hypertension. 5. Bilateral nephrolithiasis. 6. Mild thoracic adenopathy, favored to be reactive. Electronically Signed   By: Abigail Miyamoto M.D.   On: 07/21/2020 14:04   CT Angio Chest PE W and/or Wo Contrast  Result Date: 07/21/2020 CLINICAL DATA:  Cough and shortness of breath EXAM: CT ANGIOGRAPHY CHEST WITH CONTRAST TECHNIQUE: Multidetector CT imaging of the chest was performed using the standard protocol during bolus administration of intravenous contrast. Multiplanar CT image reconstructions and MIPs were obtained to evaluate the vascular anatomy. CONTRAST:  8m OMNIPAQUE IOHEXOL 350 MG/ML SOLN COMPARISON:  CT angiogram chest January 24, 2020; chest radiograph July 17, 2020 FINDINGS: Cardiovascular: There is no demonstrable pulmonary embolus. There is no appreciable thoracic aortic aneurysm or dissection. There are scattered foci of calcification in visualized great vessels. There are foci of aortic atherosclerosis. There are multiple foci coronary artery calcification. There is no pericardial effusion or pericardial thickening. Mediastinum/Nodes: No thyroid lesion evident. No appreciable thoracic adenopathy by size criteria. No esophageal lesions. Lungs/Pleura: There is a pleural effusion on each side, slightly larger on the left than the right. There are foci of airspace consolidation in the left lower lobe. There is also atelectatic change in each lower lobe and inferior lingular region. In the upper lobes posteriorly, there is patchy infiltrate on each side. Trachea and major bronchial structures appear normal. No evident pneumothorax. Upper Abdomen: Visualized upper abdominal structures appear unremarkable Musculoskeletal: There is degenerative change in the thoracic spine with diffuse idiopathic skeletal hyperostosis. There is degenerative change in each shoulder. No blastic or lytic bone lesions. No appreciable chest wall lesions. Review of the  MIP images confirms the above findings. IMPRESSION: 1. No evident pulmonary embolus. No thoracic aortic aneurysm or dissection. There is aortic atherosclerosis as well as foci of great vessel and coronary artery calcification. 2. Airspace opacity consistent with pneumonia in the left lower lobe as well as in each upper lobe, primarily posteriorly. Areas of atelectasis in each lower lobe and inferior lingula noted. 3. Small pleural effusions bilaterally, slightly larger on the left than on the right. 4.  No evident adenopathy. 5.  Diffuse idiopathic skeletal hyperostosis in the thoracic spine. Aortic Atherosclerosis (ICD10-I70.0). Electronically Signed   By: WLowella GripIII M.D.   On: 07/21/2020 11:16   [place]Venous Img Lower Bilateral (DVT)  Result Date: 07/18/2020 CLINICAL DATA:  Shortness of breath with positive D-dimer, concern for deep vein thrombosis. EXAM: BILATERAL LOWER EXTREMITY VENOUS DOPPLER ULTRASOUND TECHNIQUE: Gray-scale sonography with graded compression, as well as color Doppler and duplex ultrasound were performed to evaluate the lower extremity deep venous systems from the level of the common femoral vein and including the common femoral, femoral, profunda femoral, popliteal and calf veins including the posterior tibial, peroneal and gastrocnemius veins when visible. The superficial great saphenous vein was also interrogated. Spectral Doppler was utilized to evaluate flow at  rest and with distal augmentation maneuvers in the common femoral, femoral and popliteal veins. COMPARISON:  None. FINDINGS: RIGHT LOWER EXTREMITY Common Femoral Vein: No evidence of thrombus. Normal compressibility, respiratory phasicity and response to augmentation. Saphenofemoral Junction: No evidence of thrombus. Normal compressibility and flow on color Doppler imaging. Profunda Femoral Vein: No evidence of thrombus. Normal compressibility and flow on color Doppler imaging. Femoral Vein: No evidence of thrombus.  Normal compressibility, respiratory phasicity and response to augmentation. Popliteal Vein: No evidence of thrombus. Normal compressibility, respiratory phasicity and response to augmentation. Calf Veins: No evidence of thrombus. Normal compressibility and flow on color Doppler imaging. Superficial Great Saphenous Vein: No evidence of thrombus. Normal compressibility. Venous Reflux:  None. Other Findings:  None. LEFT LOWER EXTREMITY Common Femoral Vein: No evidence of thrombus. Normal compressibility, respiratory phasicity and response to augmentation. Saphenofemoral Junction: No evidence of thrombus. Normal compressibility and flow on color Doppler imaging. Profunda Femoral Vein: No evidence of thrombus. Normal compressibility and flow on color Doppler imaging. Femoral Vein: No evidence of thrombus. Normal compressibility, respiratory phasicity and response to augmentation. Popliteal Vein: No evidence of thrombus. Normal compressibility, respiratory phasicity and response to augmentation. Calf Veins: No evidence of thrombus. Normal compressibility and flow on color Doppler imaging. Superficial Great Saphenous Vein: No evidence of thrombus. Normal compressibility. Venous Reflux:  None. Other Findings:  None. IMPRESSION: No evidence of deep venous thrombosis in either lower extremity. Electronically Signed   By: Dahlia Bailiff MD   On: 07/29/2020 14:31   DG Chest Port 1 View  Result Date: 07/22/2020 CLINICAL DATA:  Acute respiratory failure with hypoxia EXAM: PORTABLE CHEST 1 VIEW COMPARISON:  07/30/2020 FINDINGS: Diffuse bilateral airspace disease, worsening since prior study. Cardiomegaly. No visible effusions or pneumothorax. No acute bony abnormality. IMPRESSION: Worsening bilateral airspace disease could reflect edema or infection. Electronically Signed   By: Rolm Baptise M.D.   On: 07/22/2020 22:53   DG Chest Port 1 View  Result Date: 08/05/2020 CLINICAL DATA:  Shortness of breath and wheezing EXAM:  PORTABLE CHEST 1 VIEW COMPARISON:  July 15, 2020 FINDINGS: There is cardiomegaly with pulmonary venous hypertension. There is airspace opacity in the left lower lobe and to a lesser degree in the left upper lobe. There is also subtle ill-defined opacity right upper lobe. No adenopathy no bone lesions. IMPRESSION: Cardiomegaly with pulmonary vascular congestion. Airspace opacity in the left lower lobe as well as more subtly in each upper lobe. Suspect multifocal pneumonia. Patchy pulmonary edema could present in this manner. Both edema and pneumonia may present concurrently. Note that atypical organism pneumonia could present in this manner. Check of COVID-19 status may be advised. Electronically Signed   By: Lowella Grip III M.D.   On: 07/15/2020 08:39   ECHOCARDIOGRAM COMPLETE  Result Date: 07/19/2020    ECHOCARDIOGRAM REPORT   Patient Name:   Aaron Burton Date of Exam: 07/19/2020 Medical Rec #:  [id number]     Height:       66.0 in Accession #:    [id number]    Weight:       160.0 lb Date of Birth:  07/29/1928     BSA:          1.819 m Patient Age:    66 years      BP:           100/63 mmHg Patient Gender: M             HR:  63 bpm. Exam Location:  [facility] Procedure: 2D Echo, Cardiac Doppler and Color Doppler Indications:     Elevated Troponin  History:         Patient has no prior history of Echocardiogram examinations.                  CAD; Risk Factors:Hypertension. Pneumonia.  Sonographer:     Sherrie Sport RDCS (AE) Referring Phys:  [place] Diagnosing Phys: Isaias Cowman MD IMPRESSIONS  1. Left ventricular ejection fraction, by estimation, is 60 to 65%. The left ventricle has normal function. The left ventricle has no regional wall motion abnormalities. Left ventricular diastolic parameters are indeterminate.  2. Right ventricular systolic function is normal. The right ventricular size is normal.  3. The mitral valve is normal in structure. Moderate mitral valve regurgitation. No  evidence of mitral stenosis.  4. Tricuspid valve regurgitation is mild to moderate.  5. The aortic valve is normal in structure. Aortic valve regurgitation is not visualized. Mild aortic valve sclerosis is present, with no evidence of aortic valve stenosis.  6. The inferior vena cava is normal in size with greater than 50% respiratory variability, suggesting right atrial pressure of 3 mmHg. FINDINGS  Left Ventricle: Left ventricular ejection fraction, by estimation, is 60 to 65%. The left ventricle has normal function. The left ventricle has no regional wall motion abnormalities. The left ventricular internal cavity size was normal in size. There is  no left ventricular hypertrophy. Left ventricular diastolic parameters are indeterminate. Right Ventricle: The right ventricular size is normal. No increase in right ventricular wall thickness. Right ventricular systolic function is normal. Left Atrium: Left atrial size was normal in size. Right Atrium: Right atrial size was normal in size. Pericardium: There is no evidence of pericardial effusion. Mitral Valve: The mitral valve is normal in structure. Moderate mitral valve regurgitation. No evidence of mitral valve stenosis. Tricuspid Valve: The tricuspid valve is normal in structure. Tricuspid valve regurgitation is mild to moderate. No evidence of tricuspid stenosis. Aortic Valve: The aortic valve is normal in structure. Aortic valve regurgitation is not visualized. Mild aortic valve sclerosis is present, with no evidence of aortic valve stenosis. Aortic valve mean gradient measures 6.7 mmHg. Aortic valve peak gradient measures 12.2 mmHg. Aortic valve area, by VTI measures 2.92 cm. Pulmonic Valve: The pulmonic valve was normal in structure. Pulmonic valve regurgitation is not visualized. No evidence of pulmonic stenosis. Aorta: The aortic root is normal in size and structure. Venous: The inferior vena cava is normal in size with greater than 50% respiratory  variability, suggesting right atrial pressure of 3 mmHg. IAS/Shunts: No atrial level shunt detected by color flow Doppler.  LEFT VENTRICLE PLAX 2D LVIDd:         3.42 cm LVIDs:         2.43 cm LV PW:         1.72 cm LV IVS:        0.80 cm LVOT diam:     2.00 cm LV SV:         79 LV SV Index:   44 LVOT Area:     3.14 cm  RIGHT VENTRICLE RV Basal diam:  2.88 cm RV S prime:     11.60 cm/s TAPSE (M-mode): 3.7 cm LEFT ATRIUM           Index       RIGHT ATRIUM           Index LA diam:  4.60 cm 2.53 cm/m  RA Area:     13.80 cm LA Vol (A4C): 70.0 ml 38.49 ml/m RA Volume:   29.40 ml  16.16 ml/m  AORTIC VALVE                    PULMONIC VALVE AV Area (Vmax):    2.94 cm     PV Vmax:        0.96 m/s AV Area (Vmean):   2.68 cm     PV Peak grad:   3.7 mmHg AV Area (VTI):     2.92 cm     RVOT Peak grad: 3 mmHg AV Vmax:           175.00 cm/s AV Vmean:          116.967 cm/s AV VTI:            0.271 m AV Peak Grad:      12.2 mmHg AV Mean Grad:      6.7 mmHg LVOT Vmax:         164.00 cm/s LVOT Vmean:        99.800 cm/s LVOT VTI:          0.252 m LVOT/AV VTI ratio: 0.93  AORTA Ao Root diam: 3.30 cm MITRAL VALVE                TRICUSPID VALVE MV Area (PHT): 3.89 cm     TR Peak grad:   46.8 mmHg MV Decel Time: 195 msec     TR Vmax:        342.00 cm/s MV E velocity: 128.00 cm/s                             SHUNTS                             Systemic VTI:  0.25 m                             Systemic Diam: 2.00 cm Isaias Cowman MD Electronically signed by Isaias Cowman MD Signature Date/Time: 07/19/2020/4:14:01 PM    Final     Consults: Treatment Team:  Tyler Pita, MD Ottie Glazier, MD   Subjective:    Overnight Issues:  Continues high FiO2 requirement on high flow O2.  Does not tolerate BiPAP.  Overall feels that he is mobilizing secretions better.  Still very short of breath but appears less tachypneic.  Started MetaNeb yesterday.  07/25/20- patient is with family.  He was resting in bed this am  NAD.  He is meeting with palliative care and it seems family and patient are leaning towards non-aggressive therapy.  Ive met with ID Dr Ramon Dredge and we reviewed options for Ribavirin but it was explained that due to paucity of data in nanogenerian population we would offer only supportive care at this time. I agree with palliative and conservative measures with supportive care.   ROS: A 10 point review of systems was performed and it is as noted above otherwise negative. Objective:  Vital signs for last 24 hours: Temp:  [97.8 F (36.6 C)-98.1 F (36.7 C)] 98 F (36.7 C) (06/13 0432) Pulse Rate:  [69-89] 85 (06/13 0432) Resp:  [18-25] 20 (06/13 0432) BP: (117-163)/(58-119) 117/79 (06/13 0432) SpO2:  [95 %-100 %] 97 % (06/13 0739)  FiO2 (%):  [74 %-100 %] 74 % (06/13 0741) Weight:  [71.3 kg] 71.3 kg (06/13 0432)   Intake/Output from previous day: 06/12 0701 - 06/13 0700 In: 120 [P.O.:120] Out: 3150 [Urine:3150]  Intake/Output this shift: No intake/output data recorded.  Oxygen requirement for last 24 hours: FiO2 (%):  [74 %-100 %] 74 %  Physical Exam:  GENERAL: Elderly gentleman, no conversational dyspnea.  Awake and alert.  On high flow nasal cannula. HEAD: Normocephalic, atraumatic.  EYES: Pupils equal, round, reactive to light.  No scleral icterus.  MOUTH: Oral mucosa moist.  No thrush. NECK: Supple. No thyromegaly. Trachea midline. No JVD.  No adenopathy. PULMONARY: Good air entry bilaterally.  Coarse breath sounds, otherwise no adventitious sounds.  CARDIOVASCULAR: S1 and S2. Regular rate and rhythm.  Grade 2/6 systolic ejection murmur left sternal border. ABDOMEN: Nondistended, normoactive bowel sounds, soft, nontender. MUSCULOSKELETAL: No joint deformity, no clubbing, no edema.  NEUROLOGIC: No overt focal deficit. SKIN: Intact,warm,dry. PSYCH: Anxious, behavior appropriate.  Assessment/Plan:   Acute respiratory failure with hypoxia due to parainfluenza 3  pneumonia Acute lung injury/organizing pneumonia Continue oxygen supplementation to keep oxygen saturations between 88 to 92% Pulmonary hygiene MetaNeb twice daily Continue Brovana via neb twice daily Continue budesonide via neb twice daily Albuterol as needed Unfortunately no antivirals that can treat parainfluenza 3 Elderly are at risk for more severe disease Prognosis is guarded   Leukocytosis Likely due to steroids Resolving with discontinuation of IV steroids  Acute on chronic kidney injury Avoid nephrotoxins Trend renal panel Continue to monitor Consider holding Lasix    CODE STATUS: DNR     LOS: 8 days   Additional comments: protect  Updated patient and wife at bedside.   Ottie Glazier, M.D.  Pulmonary & [place]    *This note was dictated using voice recognition software/Dragon.  Despite best efforts to proofread, errors can occur which can change the meaning.  Any change was purely unintentional.

## 2020-07-25 NOTE — Plan of Care (Signed)
  Problem: Clinical Measurements: Goal: Respiratory complications will improve Outcome: Progressing   Problem: Clinical Measurements: Goal: Cardiovascular complication will be avoided Outcome: Progressing   Problem: Safety: Goal: Ability to remain free from injury will improve Outcome: Progressing   

## 2020-07-26 LAB — BASIC METABOLIC PANEL
Anion gap: 9 (ref 5–15)
BUN: 58 mg/dL — ABNORMAL HIGH (ref 8–23)
CO2: 31 mmol/L (ref 22–32)
Calcium: 8.8 mg/dL — ABNORMAL LOW (ref 8.9–10.3)
Chloride: 102 mmol/L (ref 98–111)
Creatinine, Ser: 1.52 mg/dL — ABNORMAL HIGH (ref 0.61–1.24)
GFR, Estimated: 44 mL/min — ABNORMAL LOW (ref >60)
Glucose, Bld: 118 mg/dL — ABNORMAL HIGH (ref 70–99)
Potassium: 5.3 mmol/L — ABNORMAL HIGH (ref 3.5–5.1)
Sodium: 142 mmol/L (ref 135–145)

## 2020-07-26 LAB — CBC
HCT: 30 % — ABNORMAL LOW (ref 39.0–52.0)
Hemoglobin: 9.7 g/dL — ABNORMAL LOW (ref 13.0–17.0)
MCH: 28.4 pg (ref 26.0–34.0)
MCHC: 32.3 g/dL (ref 30.0–36.0)
MCV: 87.7 fL (ref 80.0–100.0)
Platelets: 41 10*3/uL — ABNORMAL LOW (ref 150–400)
RBC: 3.42 MIL/uL — ABNORMAL LOW (ref 4.22–5.81)
RDW: 18.6 % — ABNORMAL HIGH (ref 11.5–15.5)
WBC: 20.8 10*3/uL — ABNORMAL HIGH (ref 4.0–10.5)
nRBC: 0.1 % (ref 0.0–0.2)

## 2020-07-26 LAB — CBC WITH DIFFERENTIAL/PLATELET
Abs Immature Granulocytes: 1.2 10*3/uL — ABNORMAL HIGH (ref 0.00–0.07)
Basophils Absolute: 0.1 10*3/uL (ref 0.0–0.1)
Basophils Relative: 1 %
Eosinophils Absolute: 0.1 10*3/uL (ref 0.0–0.5)
Eosinophils Relative: 1 %
HCT: 28.5 % — ABNORMAL LOW (ref 39.0–52.0)
Hemoglobin: 9.2 g/dL — ABNORMAL LOW (ref 13.0–17.0)
Immature Granulocytes: 6 %
Lymphocytes Relative: 8 %
Lymphs Abs: 1.5 10*3/uL (ref 0.7–4.0)
MCH: 28.6 pg (ref 26.0–34.0)
MCHC: 32.3 g/dL (ref 30.0–36.0)
MCV: 88.5 fL (ref 80.0–100.0)
Monocytes Absolute: 2.4 10*3/uL — ABNORMAL HIGH (ref 0.1–1.0)
Monocytes Relative: 13 %
Neutro Abs: 13.7 10*3/uL — ABNORMAL HIGH (ref 1.7–7.7)
Neutrophils Relative %: 71 %
Platelets: 37 10*3/uL — ABNORMAL LOW (ref 150–400)
RBC: 3.22 MIL/uL — ABNORMAL LOW (ref 4.22–5.81)
RDW: 18.5 % — ABNORMAL HIGH (ref 11.5–15.5)
Smear Review: NORMAL
WBC: 19 10*3/uL — ABNORMAL HIGH (ref 4.0–10.5)
nRBC: 0.1 % (ref 0.0–0.2)

## 2020-07-26 LAB — CULTURE, RESPIRATORY W GRAM STAIN

## 2020-07-26 LAB — MISC LABCORP TEST (SEND OUT): Labcorp test code: 164090

## 2020-07-26 LAB — FUNGITELL, SERUM: Fungitell Result: <31 pg/mL (ref <80)

## 2020-07-26 MED ORDER — SODIUM ZIRCONIUM CYCLOSILICATE 10 G PO PACK
10.0000 g | PACK | Freq: Once | ORAL | Status: AC
  Administered 2020-07-26: 10 g via ORAL
  Filled 2020-07-26: qty 1

## 2020-07-26 MED ORDER — HALOPERIDOL LACTATE 5 MG/ML IJ SOLN
1.0000 mg | Freq: Four times a day (QID) | INTRAMUSCULAR | Status: DC | PRN
  Administered 2020-07-26 – 2020-07-27 (×2): 1 mg via INTRAVENOUS
  Filled 2020-07-26 (×2): qty 1

## 2020-07-26 MED ORDER — OXYMETAZOLINE HCL 0.05 % NA SOLN
1.0000 | Freq: Two times a day (BID) | NASAL | Status: DC
  Administered 2020-07-26: 1 via NASAL
  Filled 2020-07-26: qty 15

## 2020-07-26 NOTE — Progress Notes (Signed)
Palliative: Aaron Burton is lying quietly in bed.  He appears acutely/chronically ill and frail, elderly.  He is alert and oriented x3, but with obvious hearing loss I believe that he is able to make his basic needs known.  His family, son Aaron Burton/wife Aaron Burton, daughter Aaron Burton, are at bedside.  We talked at length about Aaron Burton acute and chronic health concerns and his functional status.  Even though he is aged 87, prior to this illness Aaron Burton was independent.  Family shares that he has a strong will and was cutting his grass last week.  We talked about influenza and the treatment plan including, but not limited to, steroids, white blood cells, but in particular oxygen use.  We talked about oxygen requirements at 70% today.  We talked about meaningful improvements.  At this point Aaron Burton desires to continue to try to get better.  Family asks reasonable questions about expected time frames.  I share that it could take an extended period for Aaron Burton to regain functional status due to his decreased functional ability at this time.  We talked about short-term rehab, but family states that they do not believe Aaron Burton would except short-term rehab.  I shared that if he were to go home, he would need 24/7 care.  I do share that at this point, he could not be transferred out of the hospital due to high oxygen needs.  We talked about what hospice care would look like if he is not having meaningful improvement.  Conference with attending, bedside nursing staff, transition of care team related to patient condition, needs, goals of care, disposition.  Plan:   At this point continue to treat the treatable but no CPR or intubation.  Time for outcomes.  65 minutes, extended time Aaron Axe, NP Palliative medicine team Team phone [phone] Greater than 50% of this time was spent counseling and coordinating care related to the above assessment and plan.

## 2020-07-26 NOTE — Progress Notes (Signed)
PT Cancellation Note  Patient Details Name: Aaron Burton MRN: [id number] DOB: Jul 15, 1928   Cancelled Treatment:    Reason Eval/Treat Not Completed: Other (comment).  Chart reviewed.  Pt's potassium noted to be elevated to 5.3 today.  Per PT guidelines for elevated potassium, exertional activity contra-indicated.  Will hold PT at this time and re-attempt PT treatment session at a later date/time as medically appropriate.  Leitha Bleak, PT 07/26/20, 1:59 PM

## 2020-07-26 NOTE — Progress Notes (Signed)
PROGRESS NOTE  KALAN YELEY SAY:[id number] DOB: Dec 07, 1928 DOA: 08/03/2020 PCP: Dion Body, MD   LOS: 9 days   Brief Narrative / Interim history: 87 year old male with HTN, HLD, CAD, chronic kidney disease stage IIIb, chronic thrombocytopenia came into the hospital with cough, shortness of breath, fever and chills.  CT angiogram on admission was negative for PE but did show infiltrates in the left lower lobe and bilateral upper lobes.  He was febrile at 102.9, tachycardic, tachypneic.  He was also hypoxic on room air requiring supplemental oxygen.  Hospital course complicated by developing ARDS and progressive hypoxia.  Etiology is parainfluenza 3 virus.  ID/pulmonary/palliative consulted  Subjective / 24h Interval events: Feels a little bit better this morning, able to catch his breath better.  Assessment & Plan: Principal Problem Acute hypoxic respiratory failure due to multifocal community-acquired pneumonia due to parainfluenza 3 virus, with ARDS-patient had progressive deterioration of his respiratory status, initially on 2 L but then progressed to 100% FiO2 60 L on heated high flow on 6/13.  There is no FDA approved treatment for parainfluenza 3 virus -Pulmonary and ID consulted, appreciate input. -Slightly better today on 70% FiO2 and 50 L -Palliative consulted, family hopeful for recovery but understand that this may be a terminal event for him -Continue supportive treatment  Active Problems Elevated troponin, possible acute on chronic diastolic CHF-up to 235, trending down.  Cardiology consulted, felt to be demand ischemia.  He was also diuresed with IV Lasix up until 6/13, now on hold.  Evaluated need for Lasix on a daily basis especially in the setting of ARDS  Hyperkalemia-Lokelma today  Elevated D-dimer -CT angio negative for PE, lower extremity Doppler without evidence of DVT  Coronary artery disease -no chest pain, continue Plavix, Zocor  Paroxysmal A. fib-noted on  telemetry, cardiology started low-dose Eliquis and his Plavix was discontinued.  Continue current regimen, no longer has hemoptysis  Thrombocytopenia-chronic, monitor  Chronic kidney disease stage IIIb -Baseline creatinine ranges 1.4-1.8, overall stable  Essential hypertension-continue metoprolol   Scheduled Meds:  apixaban  2.5 mg Oral BID   arformoterol  15 mcg Nebulization BID   budesonide (PULMICORT) nebulizer solution  0.25 mg Nebulization BID   cholecalciferol  5,000 Units Oral Once per day on Mon Thu   gabapentin  600 mg Oral QHS   guaiFENesin  1,200 mg Oral BID   metoprolol succinate  25 mg Oral Daily   multivitamin with minerals  1 tablet Oral Daily   simvastatin  20 mg Oral QPM   Continuous Infusions:  sodium chloride Stopped (07/20/20 0713)   PRN Meds:.sodium chloride, acetaminophen, albuterol, hydrALAZINE, morphine injection, ondansetron (ZOFRAN) IV, senna-docusate  Diet Orders (From admission, onward)     Start     Ordered   08/09/2020 1153  Diet Heart Room service appropriate? Yes; Fluid consistency: Thin  Diet effective now       Question Answer Comment  Room service appropriate? Yes   Fluid consistency: Thin   Na restriction, if any: 2 gm Na      07/28/2020 1152            DVT prophylaxis: apixaban (ELIQUIS) tablet 2.5 mg Start: 07/19/20 2200 apixaban (ELIQUIS) tablet 2.5 mg     Code Status: DNR  Family Communication: Daughter at bedside  Status is: Inpatient  Remains inpatient appropriate because:Inpatient level of care appropriate due to severity of illness  Dispo: The patient is from: Home  Anticipated d/c is to: Home              Patient currently is not medically stable to d/c.   Difficult to place patient No  Level of care: Progressive Cardiac  Consultants:  Cardiology  ID  Procedures:  2D echo  Microbiology  none  Antimicrobials: Status post a course of ceftriaxone and doxycycline Azithromycin 6/10 with plans for 5  days   Objective: Vitals:   07/26/20 0309 07/26/20 0515 07/26/20 0727 07/26/20 0811  BP:  126/68  (!) 139/58  Pulse: 98 88 97 72  Resp: (!) 24 20 (!) 22 20  Temp:  98.2 F (36.8 C)  97.8 F (36.6 C)  TempSrc:      SpO2: 92% 98% 97% 97%  Weight:      Height:        Intake/Output Summary (Last 24 hours) at 07/26/2020 0937 Last data filed at 07/26/2020 0813 Gross per 24 hour  Intake 480 ml  Output 1900 ml  Net -1420 ml    Filed Weights   07/23/20 0421 07/24/20 0446 07/25/20 0432  Weight: 75.1 kg 74.9 kg 71.3 kg    Examination:  Constitutional: No distress, getting a breathing treatment and appears comfortable Eyes: No scleral icterus ENMT: mmm Neck: normal, supple Respiratory: Bilateral rhonchi, tachypneic at times, no wheezing Cardiovascular: Regular rate and rhythm, no peripheral edema Abdomen: Soft, nontender, nondistended, bowel sounds positive Musculoskeletal: no clubbing / cyanosis.  Skin: No rashes appreciated Neurologic: Alert and oriented x4, no focal deficits  Data Reviewed: I have independently reviewed following labs and imaging studies   CBC: Recent Labs  Lab 07/21/20 0745 07/22/20 0547 07/23/20 0553 07/24/20 0408 07/26/20 0718  WBC 35.0* 38.6* 27.5* 26.7* 20.8*  HGB 9.8* 9.6* 9.2* 9.0* 9.7*  HCT 29.7* 29.5* 28.7* 28.2* 30.0*  MCV 86.8 86.5 87.5 88.4 87.7  PLT 122* 123* 126* 112* 41*    Basic Metabolic Panel: Recent Labs  Lab 07/21/20 0745 07/22/20 0547 07/23/20 0553 07/24/20 0408 07/26/20 0718  NA 140 142 140 140 142  K 3.9 4.4 4.2 3.8 5.3*  CL 109 109 108 103 102  CO2 21* 23 23 27 31   GLUCOSE 131* 135* 121* 129* 118*  BUN 52* 57* 53* 56* 58*  CREATININE 1.61* 1.51* 1.61* 1.71* 1.52*  CALCIUM 9.3 9.2 8.8* 8.6* 8.8*  MG  --   --   --  1.9  --   PHOS  --   --   --  4.3  --     Liver Function Tests: Recent Labs  Lab 07/20/20 0532 07/21/20 0745 07/24/20 0408  AST 66* 50* 44*  ALT 54* 47* 38  ALKPHOS 66 75 122  BILITOT 0.6  0.6 1.4*  PROT 6.5 6.7 6.4*  ALBUMIN 2.8* 2.9* 2.5*    Coagulation Profile: No results for input(s): INR, PROTIME in the last 168 hours.  HbA1C: No results for input(s): HGBA1C in the last 72 hours.  CBG: No results for input(s): GLUCAP in the last 168 hours.  Recent Results (from the past 240 hour(s))  Blood culture (routine single)     Status: None   Collection Time: 08/03/2020  8:15 AM   Specimen: BLOOD  Result Value Ref Range Status   Specimen Description BLOOD RIGHT ANTECUBITAL  Final   Special Requests   Final    BOTTLES DRAWN AEROBIC AND ANAEROBIC Blood Culture adequate volume   Culture   Final    NO GROWTH 6 DAYS Performed at [facility], [address] [place], [place] [address]    Report Status 07/23/2020 FINAL  Final  Urine culture     Status: None   Collection Time: 07/23/2020  8:15 AM   Specimen: In/Out Cath Urine  Result Value Ref Range Status   Specimen Description   Final    IN/OUT CATH URINE Performed at [facility], [address] [place], [place] [address]    Special Requests   Final    NONE Performed at [facility], [address], [place], [place] [address]    Culture   Final    NO GROWTH Performed at [facility], [place] [address], [place], [place] [address]    Report Status 07/18/2020 FINAL  Final  Resp Panel by RT-PCR (Flu A&B, Covid)     Status: None   Collection Time: 07/29/2020  8:15 AM   Specimen: Nasopharyngeal(NP) swabs in vial transport medium  Result Value Ref Range Status   SARS Coronavirus 2 by RT PCR NEGATIVE NEGATIVE Final    Comment: (NOTE) SARS-CoV-2 target nucleic acids are NOT DETECTED.  The SARS-CoV-2 RNA is generally detectable in upper respiratory specimens during the acute phase of infection. The lowest concentration of SARS-CoV-2 viral copies this assay can detect is 138 copies/mL. A negative result does not preclude SARS-Cov-2 infection and should not be used as the sole basis for  treatment or other patient management decisions. A negative result may occur with  improper specimen collection/handling, submission of specimen other than nasopharyngeal swab, presence of viral mutation(s) within the areas targeted by this assay, and inadequate number of viral copies(<138 copies/mL). A negative result must be combined with clinical observations, patient history, and epidemiological information. The expected result is Negative.  Fact Sheet for Patients:  EntrepreneurPulse.com.au  Fact Sheet for Healthcare Providers:  IncredibleEmployment.be  This test is no t yet approved or cleared by the [place] FDA and  has been authorized for detection and/or diagnosis of SARS-CoV-2 by FDA under an Emergency Use Authorization (EUA). This EUA will remain  in effect (meaning this test can be used) for the duration of the COVID-19 declaration under Section 564(b)(1) of the Act, 21 U.S.C.section 360bbb-3(b)(1), unless the authorization is terminated  or revoked sooner.       Influenza A by PCR NEGATIVE NEGATIVE Final   Influenza B by PCR NEGATIVE NEGATIVE Final    Comment: (NOTE) The Xpert Xpress SARS-CoV-2/FLU/RSV plus assay is intended as an aid in the diagnosis of influenza from Nasopharyngeal swab specimens and should not be used as a sole basis for treatment. Nasal washings and aspirates are unacceptable for Xpert Xpress SARS-CoV-2/FLU/RSV testing.  Fact Sheet for Patients: EntrepreneurPulse.com.au  Fact Sheet for Healthcare Providers: IncredibleEmployment.be  This test is not yet approved or cleared by the [place] FDA and has been authorized for detection and/or diagnosis of SARS-CoV-2 by FDA under an Emergency Use Authorization (EUA). This EUA will remain in effect (meaning this test can be used) for the duration of the COVID-19 declaration under Section 564(b)(1) of the Act, 21  U.S.C. section 360bbb-3(b)(1), unless the authorization is terminated or revoked.  Performed at [facility], [address] [place], [place] [address]   MRSA PCR Screening     Status: None   Collection Time: 07/22/20 12:14 PM   Specimen: Nasal Mucosa; Nasopharyngeal  Result Value Ref Range Status   MRSA by PCR NEGATIVE NEGATIVE Final    Comment:  The GeneXpert MRSA Assay (FDA approved for NASAL specimens only), is one component of a comprehensive MRSA colonization surveillance program. It is not intended to diagnose MRSA infection nor to guide or monitor treatment for MRSA infections. Performed at [facility], [place], [place] [address]   Respiratory (~20 pathogens) panel by PCR     Status: Abnormal   Collection Time: 07/22/20 12:14 PM   Specimen: Nasopharyngeal Swab; Respiratory  Result Value Ref Range Status   Adenovirus NOT DETECTED NOT DETECTED Final   Coronavirus 229E NOT DETECTED NOT DETECTED Final    Comment: (NOTE) The Coronavirus on the Respiratory Panel, DOES NOT test for the novel  Coronavirus (2019 nCoV)    Coronavirus HKU1 NOT DETECTED NOT DETECTED Final   Coronavirus NL63 NOT DETECTED NOT DETECTED Final   Coronavirus OC43 NOT DETECTED NOT DETECTED Final   Metapneumovirus NOT DETECTED NOT DETECTED Final   Rhinovirus / Enterovirus NOT DETECTED NOT DETECTED Final   Influenza A NOT DETECTED NOT DETECTED Final   Influenza B NOT DETECTED NOT DETECTED Final   Parainfluenza Virus 1 NOT DETECTED NOT DETECTED Final   Parainfluenza Virus 2 NOT DETECTED NOT DETECTED Final   Parainfluenza Virus 3 DETECTED (A) NOT DETECTED Final   Parainfluenza Virus 4 NOT DETECTED NOT DETECTED Final   Respiratory Syncytial Virus NOT DETECTED NOT DETECTED Final   Bordetella pertussis NOT DETECTED NOT DETECTED Final   Bordetella Parapertussis NOT DETECTED NOT DETECTED Final   Chlamydophila pneumoniae NOT DETECTED NOT DETECTED Final    Mycoplasma pneumoniae NOT DETECTED NOT DETECTED Final    Comment: Performed at [facility], [place]. [address] [place], [place] [address]  Expectorated Sputum Assessment w Gram Stain, Rflx to Resp Cult     Status: None   Collection Time: 07/22/20  3:28 PM   Specimen: Expectorated Sputum  Result Value Ref Range Status   Specimen Description EXPECTORATED SPUTUM  Final   Special Requests NONE  Final   Sputum evaluation   Final    THIS SPECIMEN IS ACCEPTABLE FOR SPUTUM CULTURE Performed at [facility], [address], [place], [place] [address]    Report Status 07/22/2020 FINAL  Final  Culture, Respiratory w Gram Stain     Status: None (Preliminary result)   Collection Time: 07/22/20  3:28 PM  Result Value Ref Range Status   Specimen Description   Final    EXPECTORATED SPUTUM Performed at [facility], [address], [place], [place] [address]    Special Requests   Final    NONE Reflexed from [phone] Performed at [facility], [address] [place], [place] [address]    Gram Stain   Final    RARE WBC PRESENT,BOTH PMN AND MONONUCLEAR FEW YEAST RARE GRAM POSITIVE COCCI RARE GRAM POSITIVE RODS    Culture   Final    MODERATE YEAST IDENTIFICATION TO FOLLOW Performed at [facility], [place] [address], [place], [place] [address]    Report Status PENDING  Incomplete     Radiology Studies: No results found.   Marzetta Board, MD, PhD [facility]  Between 7 am - 7 pm I am available, please contact me via Amion (for emergencies) or Securechat (non urgent messages)  Between 7 pm - 7 am I am not available, please contact night coverage MD/APP via Amion

## 2020-07-27 DIAGNOSIS — Z515 Encounter for palliative care: Secondary | ICD-10-CM

## 2020-07-27 DIAGNOSIS — U071 COVID-19: Secondary | ICD-10-CM

## 2020-07-27 MED ORDER — MORPHINE SULFATE (PF) 2 MG/ML IV SOLN
1.0000 mg | INTRAVENOUS | Status: DC | PRN
  Administered 2020-07-27: 1 mg via INTRAVENOUS
  Filled 2020-07-27: qty 1

## 2020-07-27 MED ORDER — SALINE SPRAY 0.65 % NA SOLN
1.0000 | NASAL | Status: DC | PRN
  Filled 2020-07-27: qty 44

## 2020-07-27 MED ORDER — LORAZEPAM 2 MG/ML IJ SOLN
1.0000 mg | INTRAMUSCULAR | Status: DC | PRN
  Administered 2020-07-27: 1 mg via INTRAVENOUS
  Filled 2020-07-27: qty 1

## 2020-08-12 NOTE — Progress Notes (Addendum)
Patient's family stated they were concerned about breathing.  Stated that they think he had stopped breathing.    Went to assess patient-  Did not hear a heart beat or feel a pulse.   Order in computer for RN to pronounce death.  [place] Camera operator provided second verification.   Family at bedside - allowed time to visit with patient.    [facility] [employer] services. Paper filled out for [facility].   IV, condom cath., and telemetry stickers removed.  Patient cleaned.

## 2020-08-12 NOTE — Progress Notes (Addendum)
[facility])  Referral received for EOL care at [facility].  We do not have a bed at [facility] today.  Met with patient and family at the bedside, answered questions and provided support. They endorse they would prefer for him to be transferred to [facility] as his wife had passed there years ago. They raise concerns about if we can manage his respiratory symptoms there. I explain that we can provide O2 @ 15 lpm at [facility] and we can accommodate him based on his current needs. I also explained that we will not escalate care, but rather manage his symptoms of respiratory distress. The family voiced understanding and are in agreement with pursuing placement at [facility].  [facility] will update family and TOC manager once a bed is available.  Thank you, Venia Carbon RN, BSN, [facility] Liaison   **Aaron Burton has been approved for residential hospice at [facility]. Once bed availability changes, we will be able to offer him a bed.

## 2020-08-12 NOTE — Progress Notes (Signed)
Per Family-  Patient going to made comfort care.    Lab was asking about morning draw.  Per MD-  will discontinue labs for now.  Will reorder if needed.

## 2020-08-12 NOTE — Progress Notes (Signed)
Nutrition Brief Note  Chart reviewed. Spoke with family at bedside - no current nutrition concerns.  Pt now transitioning to comfort care/hospice.   No further nutrition interventions planned at this time.  Please re-consult as needed.   Derrel Nip, RD, LDN Registered Dietitian I After-Hours/Weekend Pager # in [phone]

## 2020-08-12 NOTE — Progress Notes (Signed)
[place] [facility] [place] NAME: Aaron Burton    MR#:  [id number]  DATE OF BIRTH:  06-May-1928  SUBJECTIVE:   patient remains very restless and requiring still high quantities of oxygen. Hard on hearing. Unable to give any history review of systems. Family multiple members in the room. At a long discussion with each of the family members and answered all the questions best of my ability. REVIEW OF SYSTEMS:   Review of Systems  Unable to perform ROS: Severe respiratory distress    DRUG ALLERGIES:   Allergies  Allergen Reactions  . Ace Inhibitors Swelling    Facial swelling  . Haemophilus Influenzae Vaccines     pna after receving    VITALS:  Blood pressure (!) 116/55, pulse 76, temperature 97.7 F (36.5 C), resp. rate 20, height 5\' 6"  (1.676 m), weight 71.3 kg, SpO2 99 %.  PHYSICAL EXAMINATION:   Physical Exam limited exam secondary to mental status change and patient participation GENERAL:  87 y.o.-year-old patient lying in the bed with mild to moderate acute distress.  Appears ill. LUNGS: shallow breath sounds bilaterally,. No use of accessory muscles of respiration. NRB+ CARDIOVASCULAR: S1, S2 normal. No murmurs, rubs, or gallops.  EXTREMITIES: No cyanosis, clubbing or edema b/l.   DJD+ NEUROLOGIC: unable to assess due to mental status change. Moves all extremities well. PSYCHIATRIC:  patient is lethargic SKIN: No obvious rash, lesion, or ulcer.   LABORATORY PANEL:  CBC Recent Labs  Lab 07/26/20 1110  WBC 19.0*  HGB 9.2*  HCT 28.5*  PLT 37*    Chemistries  Recent Labs  Lab 07/24/20 0408 07/26/20 0718  NA 140 142  K 3.8 5.3*  CL 103 102  CO2 27 31  GLUCOSE 129* 118*  BUN 56* 58*  CREATININE 1.71* 1.52*  CALCIUM 8.6* 8.8*  MG 1.9  --   AST 44*  --   ALT 38  --   ALKPHOS 122  --   BILITOT 1.4*  --    Cardiac Enzymes No results for input(s): TROPONINI in the last 168 hours. RADIOLOGY:  No results  found. ASSESSMENT AND PLAN:  87 year old male with HTN, HLD, CAD, chronic kidney disease stage IIIb, chronic thrombocytopenia came into the hospital with cough, shortness of breath, fever and chills.  CT angiogram on admission was negative for PE but did show infiltrates in the left lower lobe and bilateral upper lobes.  He was febrile at 102.9, tachycardic, tachypneic.  Acute hypoxic respiratory failure due to multifocal community-acquired pneumonia due to parainfluenza 3 virus, with ARDS-patient had progressive deterioration of his respiratory status, initially on 2 L but then progressed to 100% FiO2 60 L on NRB with [place] sats >92%.  There is no FDA approved treatment for parainfluenza 3 virus -Pulmonary and ID consulted, appreciate input. -Slightly better today on 70% FiO2 and 50 L -Palliative consulted--Pt is DNR -- discussed with son, daughter-in-law, daughter, granddaughter and they do understand patient's prognosis is poor given significant lung damage from viral pneumonia. They do understand patient is requiring high amount of oxygen and is not winnable. Family has discussed and are in agreement with comfort measures. Hospice was introduced. Family aware. Will have TOC consult for hospice to see patient. They're leaning towards hospice facility. -Continue supportive treatment   Elevated troponin, possible acute on chronic diastolic CHF-   Hyperkalemia   Elevated D-dimer -CT angio negative for PE, lower extremity Doppler without evidence of DVT   Coronary  artery disease    Paroxysmal A. Fib   Thrombocytopenia-chronic, monitor   Chronic kidney disease stage IIIb -Baseline creatinine ranges 1.4-1.8, overall stable   Essential hypertension    Procedures: Family communication :int he room with extended family Consults : CODE STATUS: DNR/DNI DVT Prophylaxis :comfort care Level of care: Progressive Cardiac Status is: Inpatient  Remains inpatient appropriate because:Unsafe d/c  plan  Dispo: The patient is from: Home              [facility]              Patient currently is not medically stable to d/c.   Difficult to place patient No  patient has poor prognosis. Currently on comfort measures. TOC for discharge planning. Hospice to see patient today.      TOTAL TIME TAKING CARE OF THIS PATIENT: 35 minutes.  >50% time spent on counselling and coordination of care  Note: This dictation was prepared with Dragon dictation along with smaller phrase technology. Any transcriptional errors that result from this process are unintentional.  Fritzi Mandes M.D    [facility]   CC: Primary care physician; Dion Body, MD Patient ID: Aaron Burton, male   DOB: 1928/12/25, 87 y.o.   MRN: [id number]

## 2020-08-12 NOTE — Progress Notes (Signed)
Patient ID: Aaron Burton, male   DOB: [birth year], 87 y.o.   MRN: [id number]  patient has been very restless throughout this morning. Daughter at bedside. Discontinue telemetry and pulse continuous pulse oximetry. Increased frequency of morphine and Ativan. Family aware if patient continues to remain restless and agitated morphine drip would be an option if the would like to consider. Daughter also request hospice facility transfer at discharge. TOC aware. Awaiting hospital hospice liaison to meet with family.

## 2020-08-12 NOTE — Death Summary Note (Signed)
DEATH SUMMARY   Patient Details  Name: Aaron Burton MRN: [id number] DOB: Mar 17, 1928  Admission/Discharge Information   Admit Date:  07/18/20  Date of Death: Date of Death: 2020/07/28  Time of Death: Time of Death: 74  Length of Stay: 04-23-22  Referring Physician: Dion Body, MD   Reason(s) for Hospitalization   cough fever and shortness of breath Diagnoses  Preliminary cause of death:  Secondary Diagnoses (including complications and co-morbidities):  Principal Problem:   CAP (community acquired pneumonia) Active Problems:   Coronary artery disease   Hypercholesterolemia   Hypertension   Elevated troponin   Sepsis ([place])   Thrombocytopenia ([place])   CKD (chronic kidney disease), stage IIIb   Acute respiratory failure with hypoxemia (HCC)   Acute respiratory distress syndrome (ARDS) due to COVID-19 virus [facility])   Comfort measures only status   Brief Hospital Course (including significant findings, care, treatment, and services provided and events leading to death)   87 year old male with HTN, HLD, CAD, chronic kidney disease stage IIIb, chronic thrombocytopenia came into the hospital with cough, shortness of breath, fever and chills.  CT angiogram on admission was negative for PE but did show infiltrates in the left lower lobe and bilateral upper lobes.  He was febrile at 102.9, tachycardic, tachypneic  Acute hypoxic respiratory failure due to multifocal community-acquired pneumonia due to parainfluenza 3 virus, with ARDS-patient had progressive deterioration of his respiratory status, initially on 2 L but then progressed to 100% FiO2 60 L on NRB with [place] sats >92%.  There is no FDA approved treatment for parainfluenza 3 virus -Pulmonary and ID consulted, appreciate input. -Patient was very restless agitated and required higher amount of oxygen. -Palliative consulted--Pt is DNR -- discussed with son, daughter-in-law, daughter, granddaughter and they do understand patient's  prognosis is poor given significant lung damage from viral with superimposed suspected bacterial pneumonia. They do understand patient is requiring high amount of oxygen and is not winnable. Family has discussed and are in agreement with comfort measures. Hospice was introduced.  Patient was continued in the hospital on comfort measures. He passed away Jul 29, 2022 at 2:30 PM. Family was at bedside.  other comorbidities were chronic kidney disease stage IIIB, CAD, paroxysmal a fib, hypertension.   Pertinent Labs and Studies  Significant Diagnostic Studies DG Chest 2 View  Result Date: 07/15/2020 CLINICAL DATA:  Productive cough. EXAM: CHEST - 2 VIEW COMPARISON:  January 24, 2020. FINDINGS: Similar streaky/linear left basilar/retrocardiac opacities. No new consolidation. Previously seen interstitial opacities are improved. Scattered calcified granulomas. No visible pneumothorax or pleural effusion. Biapical pleuroparenchymal scarring. Chronic L2 compression fracture. Calcific atherosclerosis. Similar cardiomediastinal silhouette. IMPRESSION: Similar streaky/linear left basilar/retrocardiac opacities. The stability of this finding favors atelectasis/scar over infection. Electronically Signed   By: Margaretha Sheffield MD   On: 07/15/2020 11:03   CT CHEST WO CONTRAST  Result Date: 07/21/2020 CLINICAL DATA:  Worsening shortness of breath and leukocytosis. EXAM: CT CHEST WITHOUT CONTRAST TECHNIQUE: Multidetector CT imaging of the chest was performed following the standard protocol without IV contrast. COMPARISON:  2020/07/18 FINDINGS: Cardiovascular: Aortic atherosclerosis. Moderate cardiomegaly, without pericardial effusion. Three vessel coronary artery calcification. Aortic valve calcification is nonspecific in this age group. Pulmonary artery enlargement, outflow tract 3.1 cm. Mediastinum/Nodes: Right paratracheal node maintains its fatty hilum and measures 1.1 cm on 47/2, similar. Node within the  azygoesophageal recess measures 1.5 cm on 69/2 and is enlarged from approximately 9 mm on the prior. Hilar regions poorly evaluated without intravenous contrast. Lungs/Pleura: Trace  left pleural fluid, similar. Right pleural effusion is nearly completely resolved. Significantly worsened aeration. Relatively diffuse airspace and ground-glass opacity. No areas of extraalveolar air to suggest necrosis. Relative sparing of the left lower lobe. Upper Abdomen: Normal imaged portions of the liver, spleen, stomach, pancreas, adrenal glands. Upper pole 5 mm right renal collecting system calculus. Punctate upper pole left renal collecting system calculi. Upper pole left renal low-density 11 mm lesion is likely a cyst. Musculoskeletal: Diffuse endoscopic skeletal hyperostosis throughout the spine. Mild osteopenia. IMPRESSION: 1. Worsened aeration with relatively diffuse interstitial and airspace disease, most consistent with progressive infection, including atypical etiologies. Not a typical appearance of COVID-19 pneumonia which should be clinically excluded. 2. Persistent tiny left and near complete resolution of right-sided pleural effusion. 3. Coronary artery atherosclerosis. Aortic Atherosclerosis (ICD10-I70.0). 4. Pulmonary artery enlargement suggests pulmonary arterial hypertension. 5. Bilateral nephrolithiasis. 6. Mild thoracic adenopathy, favored to be reactive. Electronically Signed   By: Abigail Miyamoto M.D.   On: 07/21/2020 14:04   CT Angio Chest PE W and/or Wo Contrast  Result Date: 07/21/2020 CLINICAL DATA:  Cough and shortness of breath EXAM: CT ANGIOGRAPHY CHEST WITH CONTRAST TECHNIQUE: Multidetector CT imaging of the chest was performed using the standard protocol during bolus administration of intravenous contrast. Multiplanar CT image reconstructions and MIPs were obtained to evaluate the vascular anatomy. CONTRAST:  12mL OMNIPAQUE IOHEXOL 350 MG/ML SOLN COMPARISON:  CT angiogram chest January 24, 2020;  chest radiograph July 17, 2020 FINDINGS: Cardiovascular: There is no demonstrable pulmonary embolus. There is no appreciable thoracic aortic aneurysm or dissection. There are scattered foci of calcification in visualized great vessels. There are foci of aortic atherosclerosis. There are multiple foci coronary artery calcification. There is no pericardial effusion or pericardial thickening. Mediastinum/Nodes: No thyroid lesion evident. No appreciable thoracic adenopathy by size criteria. No esophageal lesions. Lungs/Pleura: There is a pleural effusion on each side, slightly larger on the left than the right. There are foci of airspace consolidation in the left lower lobe. There is also atelectatic change in each lower lobe and inferior lingular region. In the upper lobes posteriorly, there is patchy infiltrate on each side. Trachea and major bronchial structures appear normal. No evident pneumothorax. Upper Abdomen: Visualized upper abdominal structures appear unremarkable Musculoskeletal: There is degenerative change in the thoracic spine with diffuse idiopathic skeletal hyperostosis. There is degenerative change in each shoulder. No blastic or lytic bone lesions. No appreciable chest wall lesions. Review of the MIP images confirms the above findings. IMPRESSION: 1. No evident pulmonary embolus. No thoracic aortic aneurysm or dissection. There is aortic atherosclerosis as well as foci of great vessel and coronary artery calcification. 2. Airspace opacity consistent with pneumonia in the left lower lobe as well as in each upper lobe, primarily posteriorly. Areas of atelectasis in each lower lobe and inferior lingula noted. 3. Small pleural effusions bilaterally, slightly larger on the left than on the right. 4.  No evident adenopathy. 5.  Diffuse idiopathic skeletal hyperostosis in the thoracic spine. Aortic Atherosclerosis (ICD10-I70.0). Electronically Signed   By: Lowella Grip III M.D.   On: 07/20/2020 11:16    US Venous Img Lower Bilateral (DVT)  Result Date: 08/05/2020 CLINICAL DATA:  Shortness of breath with positive D-dimer, concern for deep vein thrombosis. EXAM: BILATERAL LOWER EXTREMITY VENOUS DOPPLER ULTRASOUND TECHNIQUE: Gray-scale sonography with graded compression, as well as color Doppler and duplex ultrasound were performed to evaluate the lower extremity deep venous systems from the level of the common femoral vein and including  the common femoral, femoral, profunda femoral, popliteal and calf veins including the posterior tibial, peroneal and gastrocnemius veins when visible. The superficial great saphenous vein was also interrogated. Spectral Doppler was utilized to evaluate flow at rest and with distal augmentation maneuvers in the common femoral, femoral and popliteal veins. COMPARISON:  None. FINDINGS: RIGHT LOWER EXTREMITY Common Femoral Vein: No evidence of thrombus. Normal compressibility, respiratory phasicity and response to augmentation. Saphenofemoral Junction: No evidence of thrombus. Normal compressibility and flow on color Doppler imaging. Profunda Femoral Vein: No evidence of thrombus. Normal compressibility and flow on color Doppler imaging. Femoral Vein: No evidence of thrombus. Normal compressibility, respiratory phasicity and response to augmentation. Popliteal Vein: No evidence of thrombus. Normal compressibility, respiratory phasicity and response to augmentation. Calf Veins: No evidence of thrombus. Normal compressibility and flow on color Doppler imaging. Superficial Great Saphenous Vein: No evidence of thrombus. Normal compressibility. Venous Reflux:  None. Other Findings:  None. LEFT LOWER EXTREMITY Common Femoral Vein: No evidence of thrombus. Normal compressibility, respiratory phasicity and response to augmentation. Saphenofemoral Junction: No evidence of thrombus. Normal compressibility and flow on color Doppler imaging. Profunda Femoral Vein: No evidence of thrombus. Normal  compressibility and flow on color Doppler imaging. Femoral Vein: No evidence of thrombus. Normal compressibility, respiratory phasicity and response to augmentation. Popliteal Vein: No evidence of thrombus. Normal compressibility, respiratory phasicity and response to augmentation. Calf Veins: No evidence of thrombus. Normal compressibility and flow on color Doppler imaging. Superficial Great Saphenous Vein: No evidence of thrombus. Normal compressibility. Venous Reflux:  None. Other Findings:  None. IMPRESSION: No evidence of deep venous thrombosis in either lower extremity. Electronically Signed   By: Dahlia Bailiff MD   On: 08/11/2020 14:31   DG Chest Port 1 View  Result Date: 07/22/2020 CLINICAL DATA:  Acute respiratory failure with hypoxia EXAM: PORTABLE CHEST 1 VIEW COMPARISON:  07/25/2020 FINDINGS: Diffuse bilateral airspace disease, worsening since prior study. Cardiomegaly. No visible effusions or pneumothorax. No acute bony abnormality. IMPRESSION: Worsening bilateral airspace disease could reflect edema or infection. Electronically Signed   By: Rolm Baptise M.D.   On: 07/22/2020 22:53   DG Chest Port 1 View  Result Date: 08/07/2020 CLINICAL DATA:  Shortness of breath and wheezing EXAM: PORTABLE CHEST 1 VIEW COMPARISON:  July 15, 2020 FINDINGS: There is cardiomegaly with pulmonary venous hypertension. There is airspace opacity in the left lower lobe and to a lesser degree in the left upper lobe. There is also subtle ill-defined opacity right upper lobe. No adenopathy no bone lesions. IMPRESSION: Cardiomegaly with pulmonary vascular congestion. Airspace opacity in the left lower lobe as well as more subtly in each upper lobe. Suspect multifocal pneumonia. Patchy pulmonary edema could present in this manner. Both edema and pneumonia may present concurrently. Note that atypical organism pneumonia could present in this manner. Check of COVID-19 status may be advised. Electronically Signed   By: Lowella Grip III M.D.   On: 07/23/2020 08:39   ECHOCARDIOGRAM COMPLETE  Result Date: 07/19/2020    ECHOCARDIOGRAM REPORT   Patient Name:   Aaron Burton Date of Exam: 07/19/2020 Medical Rec #:  [id number]     Height:       66.0 in Accession #:    [id number]    Weight:       160.0 lb Date of Birth:  August 27, 1928     BSA:          1.819 m Patient Age:    36 years  BP:           100/63 mmHg Patient Gender: M             HR:           63 bpm. Exam Location:  [facility] Procedure: 2D Echo, Cardiac Doppler and Color Doppler Indications:     Elevated Troponin  History:         Patient has no prior history of Echocardiogram examinations.                  CAD; Risk Factors:Hypertension. Pneumonia.  Sonographer:     Sherrie Sport RDCS (AE) Referring Phys:  [place] Diagnosing Phys: Isaias Cowman MD IMPRESSIONS  1. Left ventricular ejection fraction, by estimation, is 60 to 65%. The left ventricle has normal function. The left ventricle has no regional wall motion abnormalities. Left ventricular diastolic parameters are indeterminate.  2. Right ventricular systolic function is normal. The right ventricular size is normal.  3. The mitral valve is normal in structure. Moderate mitral valve regurgitation. No evidence of mitral stenosis.  4. Tricuspid valve regurgitation is mild to moderate.  5. The aortic valve is normal in structure. Aortic valve regurgitation is not visualized. Mild aortic valve sclerosis is present, with no evidence of aortic valve stenosis.  6. The inferior vena cava is normal in size with greater than 50% respiratory variability, suggesting right atrial pressure of 3 mmHg. FINDINGS  Left Ventricle: Left ventricular ejection fraction, by estimation, is 60 to 65%. The left ventricle has normal function. The left ventricle has no regional wall motion abnormalities. The left ventricular internal cavity size was normal in size. There is  no left ventricular hypertrophy. Left ventricular diastolic parameters  are indeterminate. Right Ventricle: The right ventricular size is normal. No increase in right ventricular wall thickness. Right ventricular systolic function is normal. Left Atrium: Left atrial size was normal in size. Right Atrium: Right atrial size was normal in size. Pericardium: There is no evidence of pericardial effusion. Mitral Valve: The mitral valve is normal in structure. Moderate mitral valve regurgitation. No evidence of mitral valve stenosis. Tricuspid Valve: The tricuspid valve is normal in structure. Tricuspid valve regurgitation is mild to moderate. No evidence of tricuspid stenosis. Aortic Valve: The aortic valve is normal in structure. Aortic valve regurgitation is not visualized. Mild aortic valve sclerosis is present, with no evidence of aortic valve stenosis. Aortic valve mean gradient measures 6.7 mmHg. Aortic valve peak gradient measures 12.2 mmHg. Aortic valve area, by VTI measures 2.92 cm. Pulmonic Valve: The pulmonic valve was normal in structure. Pulmonic valve regurgitation is not visualized. No evidence of pulmonic stenosis. Aorta: The aortic root is normal in size and structure. Venous: The inferior vena cava is normal in size with greater than 50% respiratory variability, suggesting right atrial pressure of 3 mmHg. IAS/Shunts: No atrial level shunt detected by color flow Doppler.  LEFT VENTRICLE PLAX 2D LVIDd:         3.42 cm LVIDs:         2.43 cm LV PW:         1.72 cm LV IVS:        0.80 cm LVOT diam:     2.00 cm LV SV:         79 LV SV Index:   44 LVOT Area:     3.14 cm  RIGHT VENTRICLE RV Basal diam:  2.88 cm RV S prime:     11.60 cm/s  TAPSE (M-mode): 3.7 cm LEFT ATRIUM           Index       RIGHT ATRIUM           Index LA diam:      4.60 cm 2.53 cm/m  RA Area:     13.80 cm LA Vol (A4C): 70.0 ml 38.49 ml/m RA Volume:   29.40 ml  16.16 ml/m  AORTIC VALVE                    PULMONIC VALVE AV Area (Vmax):    2.94 cm     PV Vmax:        0.96 m/s AV Area (Vmean):   2.68 cm      PV Peak grad:   3.7 mmHg AV Area (VTI):     2.92 cm     RVOT Peak grad: 3 mmHg AV Vmax:           175.00 cm/s AV Vmean:          116.967 cm/s AV VTI:            0.271 m AV Peak Grad:      12.2 mmHg AV Mean Grad:      6.7 mmHg LVOT Vmax:         164.00 cm/s LVOT Vmean:        99.800 cm/s LVOT VTI:          0.252 m LVOT/AV VTI ratio: 0.93  AORTA Ao Root diam: 3.30 cm MITRAL VALVE                TRICUSPID VALVE MV Area (PHT): 3.89 cm     TR Peak grad:   46.8 mmHg MV Decel Time: 195 msec     TR Vmax:        342.00 cm/s MV E velocity: 128.00 cm/s                             SHUNTS                             Systemic VTI:  0.25 m                             Systemic Diam: 2.00 cm Isaias Cowman MD Electronically signed by Isaias Cowman MD Signature Date/Time: 07/19/2020/4:14:01 PM    Final     Microbiology Recent Results (from the past 240 hour(s))  MRSA PCR Screening     Status: None   Collection Time: 07/22/20 12:14 PM   Specimen: Nasal Mucosa; Nasopharyngeal  Result Value Ref Range Status   MRSA by PCR NEGATIVE NEGATIVE Final    Comment:        The GeneXpert MRSA Assay (FDA approved for NASAL specimens only), is one component of a comprehensive MRSA colonization surveillance program. It is not intended to diagnose MRSA infection nor to guide or monitor treatment for MRSA infections. Performed at [facility], [place], [place] [address]   Respiratory (~20 pathogens) panel by PCR     Status: Abnormal   Collection Time: 07/22/20 12:14 PM   Specimen: Nasopharyngeal Swab; Respiratory  Result Value Ref Range Status   Adenovirus NOT DETECTED NOT DETECTED Final   Coronavirus 229E NOT DETECTED NOT DETECTED Final    Comment: (NOTE)  The Coronavirus on the Respiratory Panel, DOES NOT test for the novel  Coronavirus (2019 nCoV)    Coronavirus HKU1 NOT DETECTED NOT DETECTED Final   Coronavirus NL63 NOT DETECTED NOT DETECTED Final   Coronavirus OC43 NOT DETECTED  NOT DETECTED Final   Metapneumovirus NOT DETECTED NOT DETECTED Final   Rhinovirus / Enterovirus NOT DETECTED NOT DETECTED Final   Influenza A NOT DETECTED NOT DETECTED Final   Influenza B NOT DETECTED NOT DETECTED Final   Parainfluenza Virus 1 NOT DETECTED NOT DETECTED Final   Parainfluenza Virus 2 NOT DETECTED NOT DETECTED Final   Parainfluenza Virus 3 DETECTED (A) NOT DETECTED Final   Parainfluenza Virus 4 NOT DETECTED NOT DETECTED Final   Respiratory Syncytial Virus NOT DETECTED NOT DETECTED Final   Bordetella pertussis NOT DETECTED NOT DETECTED Final   Bordetella Parapertussis NOT DETECTED NOT DETECTED Final   Chlamydophila pneumoniae NOT DETECTED NOT DETECTED Final   Mycoplasma pneumoniae NOT DETECTED NOT DETECTED Final    Comment: Performed at [facility], [place] [address] [place], [place] [address]  Expectorated Sputum Assessment w Gram Stain, Rflx to Resp Cult     Status: None   Collection Time: 07/22/20  3:28 PM   Specimen: Expectorated Sputum  Result Value Ref Range Status   Specimen Description EXPECTORATED SPUTUM  Final   Special Requests NONE  Final   Sputum evaluation   Final    THIS SPECIMEN IS ACCEPTABLE FOR SPUTUM CULTURE Performed at [facility], [address] [place], [place] [address]    Report Status 07/22/2020 FINAL  Final  Culture, Respiratory w Gram Stain     Status: None   Collection Time: 07/22/20  3:28 PM  Result Value Ref Range Status   Specimen Description   Final    EXPECTORATED SPUTUM Performed at [facility], [address], [place], [place] [address]    Special Requests   Final    NONE Reflexed from [id number] Performed at [facility], [place], [place] [address]    Gram Stain   Final    RARE WBC PRESENT,BOTH PMN AND MONONUCLEAR FEW YEAST RARE GRAM POSITIVE COCCI RARE [place] Performed at [facility], [place] [address] [place], [place] [address]    Culture MODERATE CANDIDA  LUSITANIAE  Final   Report Status 07/26/2020 FINAL  Final    Lab Basic Metabolic Panel: Recent Labs  Lab 07/23/20 0553 07/24/20 0408 07/26/20 0718  NA 140 140 142  K 4.2 3.8 5.3*  CL 108 103 102  CO2 23 27 31   GLUCOSE 121* 129* 118*  BUN 53* 56* 58*  CREATININE 1.61* 1.71* 1.52*  CALCIUM 8.8* 8.6* 8.8*  MG  --  1.9  --   PHOS  --  4.3  --    Liver Function Tests: Recent Labs  Lab 07/24/20 0408  AST 44*  ALT 38  ALKPHOS 122  BILITOT 1.4*  PROT 6.4*  ALBUMIN 2.5*   No results for input(s): LIPASE, AMYLASE in the last 168 hours. No results for input(s): AMMONIA in the last 168 hours. CBC: Recent Labs  Lab 07/23/20 0553 07/24/20 0408 07/26/20 0718 07/26/20 1110  WBC 27.5* 26.7* 20.8* 19.0*  NEUTROABS  --   --   --  13.7*  HGB 9.2* 9.0* 9.7* 9.2*  HCT 28.7* 28.2* 30.0* 28.5*  MCV 87.5 88.4 87.7 88.5  PLT 126* 112* 41* 37*   Cardiac Enzymes: No results for input(s): CKTOTAL, CKMB,  CKMBINDEX, TROPONINI in the last 168 hours. Sepsis Labs: Recent Labs  Lab 07/23/20 0553 07/24/20 0408 07/26/20 0718 07/26/20 1110  WBC 27.5* 26.7* 20.8* 19.0*    Procedures/Operations     Pascal Stiggers 07/29/2020, 4:56 PM

## 2020-08-12 NOTE — Care Management Important Message (Signed)
Important Message  Patient Details  Name: Aaron Burton MRN: [id number] Date of Birth: 1928-04-24   Medicare Important Message Given:  Other (see comment)  Plan to transfer to hospice home once bed available.  Medicare IM withheld at this time out of respect for patient and family.   Dannette Barbara 2020-08-25, 11:56 AM

## 2020-08-12 NOTE — Progress Notes (Signed)
Follow up -Pulmonary/Critical Care Medicine Note  Follow-up: Acute respiratory failure in the setting of Parainfluenza 3 pneumonia  Patient Details:    Aaron Burton is an 87 y.o. male lifelong never smoker, admitted on 17 July 2020 with increasing shortness of breath, cough fevers and chills.  He has bilateral infiltrates consistent with pneumonia.  Nasopharyngeal viral panel swab showed parainfluenza 3 positivity.  Patient on high flow O2.  07/25/20- patient is with family.  He was resting in bed this am NAD.  He is meeting with palliative care and it seems family and patient are leaning towards non-aggressive therapy.  Ive met with ID Dr Ramon Dredge and we reviewed options for Ribavirin but it was explained that due to paucity of data in nanogenerian population we would offer only supportive care at this time. I agree with palliative and conservative measures with supportive care.     Lines, Airways, Drains: External Urinary Catheter (Active)  Collection Container Standard drainage bag 07/24/20 0750  Suction (Verified suction is between 40-80 mmHg) N/A (Patient has condom catheter) 07/24/20 0750  Securement Method Securing device (Describe) 07/24/20 0750  Site Assessment Clean;Intact 07/24/20 0750  Intervention Equipment Changed 07/23/20 2102  Output (mL) 700 mL 07/24/20 1030   Scheduled Meds:  cholecalciferol  5,000 Units Oral Once per day on Mon Thu   gabapentin  600 mg Oral QHS   guaiFENesin  1,200 mg Oral BID   metoprolol succinate  25 mg Oral Daily   multivitamin with minerals  1 tablet Oral Daily   oxymetazoline  1 spray Each Nare BID   simvastatin  20 mg Oral QPM   Continuous Infusions:  sodium chloride Stopped (07/20/20 0713)   PRN Meds:.sodium chloride, acetaminophen, albuterol, haloperidol lactate, hydrALAZINE, morphine injection, ondansetron (ZOFRAN) IV, senna-docusate   Microbiology: Results for orders placed or performed during the hospital encounter of 08/05/2020   Blood culture (routine single)     Status: None   Collection Time: 08/01/2020  8:15 AM   Specimen: BLOOD  Result Value Ref Range Status   Specimen Description BLOOD RIGHT ANTECUBITAL  Final   Special Requests   Final    BOTTLES DRAWN AEROBIC AND ANAEROBIC Blood Culture adequate volume   Culture   Final    NO GROWTH 6 DAYS Performed at [facility], [address] [place], [place] [address]    Report Status 07/23/2020 FINAL  Final  Urine culture     Status: None   Collection Time: 08/10/2020  8:15 AM   Specimen: In/Out Cath Urine  Result Value Ref Range Status   Specimen Description   Final    IN/OUT CATH URINE Performed at [facility], [address] [place], [place] [address]    Special Requests   Final    NONE Performed at [facility], [address] [place], [place] [address]    Culture   Final    NO GROWTH Performed at [facility], [place] [address] [place], [place] [address]    Report Status 07/18/2020 FINAL  Final  Resp Panel by RT-PCR (Flu A&B, Covid)     Status: None   Collection Time: 08/06/2020  8:15 AM   Specimen: Nasopharyngeal(NP) swabs in vial transport medium  Result Value Ref Range Status   SARS Coronavirus 2 by RT PCR NEGATIVE NEGATIVE Final    Comment: (NOTE) SARS-CoV-2 target nucleic acids are NOT DETECTED.  The SARS-CoV-2 RNA is generally detectable in upper respiratory specimens during the acute phase of  infection. The lowest concentration of SARS-CoV-2 viral copies this assay can detect is 138 copies/mL. A negative result does not preclude SARS-Cov-2 infection and should not be used as the sole basis for treatment or other patient management decisions. A negative result may occur with  improper specimen collection/handling, submission of specimen other than nasopharyngeal swab, presence of viral mutation(s) within the areas targeted by this assay, and inadequate number of viral copies(<138 copies/mL). A negative  result must be combined with clinical observations, patient history, and epidemiological information. The expected result is Negative.  Fact Sheet for Patients:  EntrepreneurPulse.com.au  Fact Sheet for Healthcare Providers:  IncredibleEmployment.be  This test is no t yet approved or cleared by the [place] FDA and  has been authorized for detection and/or diagnosis of SARS-CoV-2 by FDA under an Emergency Use Authorization (EUA). This EUA will remain  in effect (meaning this test can be used) for the duration of the COVID-19 declaration under Section 564(b)(1) of the Act, 21 U.S.C.section 360bbb-3(b)(1), unless the authorization is terminated  or revoked sooner.       Influenza A by PCR NEGATIVE NEGATIVE Final   Influenza B by PCR NEGATIVE NEGATIVE Final    Comment: (NOTE) The Xpert Xpress SARS-CoV-2/FLU/RSV plus assay is intended as an aid in the diagnosis of influenza from Nasopharyngeal swab specimens and should not be used as a sole basis for treatment. Nasal washings and aspirates are unacceptable for Xpert Xpress SARS-CoV-2/FLU/RSV testing.  Fact Sheet for Patients: EntrepreneurPulse.com.au  Fact Sheet for Healthcare Providers: IncredibleEmployment.be  This test is not yet approved or cleared by the [place] FDA and has been authorized for detection and/or diagnosis of SARS-CoV-2 by FDA under an Emergency Use Authorization (EUA). This EUA will remain in effect (meaning this test can be used) for the duration of the COVID-19 declaration under Section 564(b)(1) of the Act, 21 U.S.C. section 360bbb-3(b)(1), unless the authorization is terminated or revoked.  Performed at [facility], [address] [place], [place] [address]   MRSA PCR Screening     Status: None   Collection Time: 07/22/20 12:14 PM   Specimen: Nasal Mucosa; Nasopharyngeal  Result Value Ref Range Status    MRSA by PCR NEGATIVE NEGATIVE Final    Comment:        The GeneXpert MRSA Assay (FDA approved for NASAL specimens only), is one component of a comprehensive MRSA colonization surveillance program. It is not intended to diagnose MRSA infection nor to guide or monitor treatment for MRSA infections. Performed at [facility], [place], [place] [address]   Respiratory (~20 pathogens) panel by PCR     Status: Abnormal   Collection Time: 07/22/20 12:14 PM   Specimen: Nasopharyngeal Swab; Respiratory  Result Value Ref Range Status   Adenovirus NOT DETECTED NOT DETECTED Final   Coronavirus 229E NOT DETECTED NOT DETECTED Final    Comment: (NOTE) The Coronavirus on the Respiratory Panel, DOES NOT test for the novel  Coronavirus (2019 nCoV)    Coronavirus HKU1 NOT DETECTED NOT DETECTED Final   Coronavirus NL63 NOT DETECTED NOT DETECTED Final   Coronavirus OC43 NOT DETECTED NOT DETECTED Final   Metapneumovirus NOT DETECTED NOT DETECTED Final   Rhinovirus / Enterovirus NOT DETECTED NOT DETECTED Final   Influenza A NOT DETECTED NOT DETECTED Final   Influenza B NOT DETECTED NOT DETECTED Final   Parainfluenza Virus 1 NOT DETECTED NOT DETECTED Final   Parainfluenza Virus 2 NOT DETECTED NOT DETECTED Final  Parainfluenza Virus 3 DETECTED (A) NOT DETECTED Final   Parainfluenza Virus 4 NOT DETECTED NOT DETECTED Final   Respiratory Syncytial Virus NOT DETECTED NOT DETECTED Final   Bordetella pertussis NOT DETECTED NOT DETECTED Final   Bordetella Parapertussis NOT DETECTED NOT DETECTED Final   Chlamydophila pneumoniae NOT DETECTED NOT DETECTED Final   Mycoplasma pneumoniae NOT DETECTED NOT DETECTED Final    Comment: Performed at [facility], [place] [address] [place], [place] [address]  Expectorated Sputum Assessment w Gram Stain, Rflx to Resp Cult     Status: None   Collection Time: 07/22/20  3:28 PM   Specimen: Expectorated Sputum  Result Value Ref Range Status    Specimen Description EXPECTORATED SPUTUM  Final   Special Requests NONE  Final   Sputum evaluation   Final    THIS SPECIMEN IS ACCEPTABLE FOR SPUTUM CULTURE Performed at [facility], [address] [place], [place] [address]    Report Status 07/22/2020 FINAL  Final  Culture, Respiratory w Gram Stain     Status: None   Collection Time: 07/22/20  3:28 PM  Result Value Ref Range Status   Specimen Description   Final    EXPECTORATED SPUTUM Performed at [facility], [address], [place], [place] [address]    Special Requests   Final    NONE Reflexed from [id number] Performed at [facility], [place], [place] [address]    Gram Stain   Final    RARE WBC PRESENT,BOTH PMN AND MONONUCLEAR FEW YEAST RARE GRAM POSITIVE COCCI RARE [place] Performed at [facility], [place] [address], [place], [place] [address]    Culture MODERATE CANDIDA LUSITANIAE  Final   Report Status 07/26/2020 FINAL  Final    Best Practice/Protocols:  VTE Prophylaxis: Direct Thrombin Inhibitor   Studies: DG Chest 2 View  Result Date: 07/15/2020 CLINICAL DATA:  Productive cough. EXAM: CHEST - 2 VIEW COMPARISON:  January 24, 2020. FINDINGS: Similar streaky/linear left basilar/retrocardiac opacities. No new consolidation. Previously seen interstitial opacities are improved. Scattered calcified granulomas. No visible pneumothorax or pleural effusion. Biapical pleuroparenchymal scarring. Chronic L2 compression fracture. Calcific atherosclerosis. Similar cardiomediastinal silhouette. IMPRESSION: Similar streaky/linear left basilar/retrocardiac opacities. The stability of this finding favors atelectasis/scar over infection. Electronically Signed   By: Margaretha Sheffield MD   On: 07/15/2020 11:03   CT CHEST WO CONTRAST  Result Date: 07/21/2020 CLINICAL DATA:  Worsening shortness of breath and leukocytosis. EXAM: CT CHEST WITHOUT CONTRAST TECHNIQUE: Multidetector CT  imaging of the chest was performed following the standard protocol without IV contrast. COMPARISON:  08/06/2020 FINDINGS: Cardiovascular: Aortic atherosclerosis. Moderate cardiomegaly, without pericardial effusion. Three vessel coronary artery calcification. Aortic valve calcification is nonspecific in this age group. Pulmonary artery enlargement, outflow tract 3.1 cm. Mediastinum/Nodes: Right paratracheal node maintains its fatty hilum and measures 1.1 cm on 47/2, similar. Node within the azygoesophageal recess measures 1.5 cm on 69/2 and is enlarged from approximately 9 mm on the prior. Hilar regions poorly evaluated without intravenous contrast. Lungs/Pleura: Trace left pleural fluid, similar. Right pleural effusion is nearly completely resolved. Significantly worsened aeration. Relatively diffuse airspace and ground-glass opacity. No areas of extraalveolar air to suggest necrosis. Relative sparing of the left lower lobe. Upper Abdomen: Normal imaged portions of the liver, spleen, stomach, pancreas, adrenal glands. Upper pole 5 mm right renal collecting system calculus. Punctate upper pole left renal collecting system calculi. Upper pole left renal low-density 11 mm lesion is likely  a cyst. Musculoskeletal: Diffuse endoscopic skeletal hyperostosis throughout the spine. Mild osteopenia. IMPRESSION: 1. Worsened aeration with relatively diffuse interstitial and airspace disease, most consistent with progressive infection, including atypical etiologies. Not a typical appearance of COVID-19 pneumonia which should be clinically excluded. 2. Persistent tiny left and near complete resolution of right-sided pleural effusion. 3. Coronary artery atherosclerosis. Aortic Atherosclerosis (ICD10-I70.0). 4. Pulmonary artery enlargement suggests pulmonary arterial hypertension. 5. Bilateral nephrolithiasis. 6. Mild thoracic adenopathy, favored to be reactive. Electronically Signed   By: Abigail Miyamoto M.D.   On: 07/21/2020 14:04    CT Angio Chest PE W and/or Wo Contrast  Result Date: 08/03/2020 CLINICAL DATA:  Cough and shortness of breath EXAM: CT ANGIOGRAPHY CHEST WITH CONTRAST TECHNIQUE: Multidetector CT imaging of the chest was performed using the standard protocol during bolus administration of intravenous contrast. Multiplanar CT image reconstructions and MIPs were obtained to evaluate the vascular anatomy. CONTRAST:  20m OMNIPAQUE IOHEXOL 350 MG/ML SOLN COMPARISON:  CT angiogram chest January 24, 2020; chest radiograph July 17, 2020 FINDINGS: Cardiovascular: There is no demonstrable pulmonary embolus. There is no appreciable thoracic aortic aneurysm or dissection. There are scattered foci of calcification in visualized great vessels. There are foci of aortic atherosclerosis. There are multiple foci coronary artery calcification. There is no pericardial effusion or pericardial thickening. Mediastinum/Nodes: No thyroid lesion evident. No appreciable thoracic adenopathy by size criteria. No esophageal lesions. Lungs/Pleura: There is a pleural effusion on each side, slightly larger on the left than the right. There are foci of airspace consolidation in the left lower lobe. There is also atelectatic change in each lower lobe and inferior lingular region. In the upper lobes posteriorly, there is patchy infiltrate on each side. Trachea and major bronchial structures appear normal. No evident pneumothorax. Upper Abdomen: Visualized upper abdominal structures appear unremarkable Musculoskeletal: There is degenerative change in the thoracic spine with diffuse idiopathic skeletal hyperostosis. There is degenerative change in each shoulder. No blastic or lytic bone lesions. No appreciable chest wall lesions. Review of the MIP images confirms the above findings. IMPRESSION: 1. No evident pulmonary embolus. No thoracic aortic aneurysm or dissection. There is aortic atherosclerosis as well as foci of great vessel and coronary artery  calcification. 2. Airspace opacity consistent with pneumonia in the left lower lobe as well as in each upper lobe, primarily posteriorly. Areas of atelectasis in each lower lobe and inferior lingula noted. 3. Small pleural effusions bilaterally, slightly larger on the left than on the right. 4.  No evident adenopathy. 5.  Diffuse idiopathic skeletal hyperostosis in the thoracic spine. Aortic Atherosclerosis (ICD10-I70.0). Electronically Signed   By: WLowella GripIII M.D.   On: 07/14/2020 11:16   [place]Venous Img Lower Bilateral (DVT)  Result Date: 07/15/2020 CLINICAL DATA:  Shortness of breath with positive D-dimer, concern for deep vein thrombosis. EXAM: BILATERAL LOWER EXTREMITY VENOUS DOPPLER ULTRASOUND TECHNIQUE: Gray-scale sonography with graded compression, as well as color Doppler and duplex ultrasound were performed to evaluate the lower extremity deep venous systems from the level of the common femoral vein and including the common femoral, femoral, profunda femoral, popliteal and calf veins including the posterior tibial, peroneal and gastrocnemius veins when visible. The superficial great saphenous vein was also interrogated. Spectral Doppler was utilized to evaluate flow at rest and with distal augmentation maneuvers in the common femoral, femoral and popliteal veins. COMPARISON:  None. FINDINGS: RIGHT LOWER EXTREMITY Common Femoral Vein: No evidence of thrombus. Normal compressibility, respiratory phasicity and response to augmentation. Saphenofemoral Junction: No  evidence of thrombus. Normal compressibility and flow on color Doppler imaging. Profunda Femoral Vein: No evidence of thrombus. Normal compressibility and flow on color Doppler imaging. Femoral Vein: No evidence of thrombus. Normal compressibility, respiratory phasicity and response to augmentation. Popliteal Vein: No evidence of thrombus. Normal compressibility, respiratory phasicity and response to augmentation. Calf Veins: No evidence  of thrombus. Normal compressibility and flow on color Doppler imaging. Superficial Great Saphenous Vein: No evidence of thrombus. Normal compressibility. Venous Reflux:  None. Other Findings:  None. LEFT LOWER EXTREMITY Common Femoral Vein: No evidence of thrombus. Normal compressibility, respiratory phasicity and response to augmentation. Saphenofemoral Junction: No evidence of thrombus. Normal compressibility and flow on color Doppler imaging. Profunda Femoral Vein: No evidence of thrombus. Normal compressibility and flow on color Doppler imaging. Femoral Vein: No evidence of thrombus. Normal compressibility, respiratory phasicity and response to augmentation. Popliteal Vein: No evidence of thrombus. Normal compressibility, respiratory phasicity and response to augmentation. Calf Veins: No evidence of thrombus. Normal compressibility and flow on color Doppler imaging. Superficial Great Saphenous Vein: No evidence of thrombus. Normal compressibility. Venous Reflux:  None. Other Findings:  None. IMPRESSION: No evidence of deep venous thrombosis in either lower extremity. Electronically Signed   By: Dahlia Bailiff MD   On: 08/06/2020 14:31   DG Chest Port 1 View  Result Date: 07/22/2020 CLINICAL DATA:  Acute respiratory failure with hypoxia EXAM: PORTABLE CHEST 1 VIEW COMPARISON:  07/26/2020 FINDINGS: Diffuse bilateral airspace disease, worsening since prior study. Cardiomegaly. No visible effusions or pneumothorax. No acute bony abnormality. IMPRESSION: Worsening bilateral airspace disease could reflect edema or infection. Electronically Signed   By: Rolm Baptise M.D.   On: 07/22/2020 22:53   DG Chest Port 1 View  Result Date: 07/26/2020 CLINICAL DATA:  Shortness of breath and wheezing EXAM: PORTABLE CHEST 1 VIEW COMPARISON:  July 15, 2020 FINDINGS: There is cardiomegaly with pulmonary venous hypertension. There is airspace opacity in the left lower lobe and to a lesser degree in the left upper lobe. There is  also subtle ill-defined opacity right upper lobe. No adenopathy no bone lesions. IMPRESSION: Cardiomegaly with pulmonary vascular congestion. Airspace opacity in the left lower lobe as well as more subtly in each upper lobe. Suspect multifocal pneumonia. Patchy pulmonary edema could present in this manner. Both edema and pneumonia may present concurrently. Note that atypical organism pneumonia could present in this manner. Check of COVID-19 status may be advised. Electronically Signed   By: Lowella Grip III M.D.   On: 07/21/2020 08:39   ECHOCARDIOGRAM COMPLETE  Result Date: 07/19/2020    ECHOCARDIOGRAM REPORT   Patient Name:   CORDARO MUKAI Date of Exam: 07/19/2020 Medical Rec #:  [id number]     Height:       66.0 in Accession #:    [id number]    Weight:       160.0 lb Date of Birth:  April 09, 1928     BSA:          1.819 m Patient Age:    76 years      BP:           100/63 mmHg Patient Gender: M             HR:           63 bpm. Exam Location:  [facility] Procedure: 2D Echo, Cardiac Doppler and Color Doppler Indications:     Elevated Troponin  History:         Patient has no  prior history of Echocardiogram examinations.                  CAD; Risk Factors:Hypertension. Pneumonia.  Sonographer:     Sherrie Sport RDCS (AE) Referring Phys:  [place] Diagnosing Phys: Isaias Cowman MD IMPRESSIONS  1. Left ventricular ejection fraction, by estimation, is 60 to 65%. The left ventricle has normal function. The left ventricle has no regional wall motion abnormalities. Left ventricular diastolic parameters are indeterminate.  2. Right ventricular systolic function is normal. The right ventricular size is normal.  3. The mitral valve is normal in structure. Moderate mitral valve regurgitation. No evidence of mitral stenosis.  4. Tricuspid valve regurgitation is mild to moderate.  5. The aortic valve is normal in structure. Aortic valve regurgitation is not visualized. Mild aortic valve sclerosis is present, with no  evidence of aortic valve stenosis.  6. The inferior vena cava is normal in size with greater than 50% respiratory variability, suggesting right atrial pressure of 3 mmHg. FINDINGS  Left Ventricle: Left ventricular ejection fraction, by estimation, is 60 to 65%. The left ventricle has normal function. The left ventricle has no regional wall motion abnormalities. The left ventricular internal cavity size was normal in size. There is  no left ventricular hypertrophy. Left ventricular diastolic parameters are indeterminate. Right Ventricle: The right ventricular size is normal. No increase in right ventricular wall thickness. Right ventricular systolic function is normal. Left Atrium: Left atrial size was normal in size. Right Atrium: Right atrial size was normal in size. Pericardium: There is no evidence of pericardial effusion. Mitral Valve: The mitral valve is normal in structure. Moderate mitral valve regurgitation. No evidence of mitral valve stenosis. Tricuspid Valve: The tricuspid valve is normal in structure. Tricuspid valve regurgitation is mild to moderate. No evidence of tricuspid stenosis. Aortic Valve: The aortic valve is normal in structure. Aortic valve regurgitation is not visualized. Mild aortic valve sclerosis is present, with no evidence of aortic valve stenosis. Aortic valve mean gradient measures 6.7 mmHg. Aortic valve peak gradient measures 12.2 mmHg. Aortic valve area, by VTI measures 2.92 cm. Pulmonic Valve: The pulmonic valve was normal in structure. Pulmonic valve regurgitation is not visualized. No evidence of pulmonic stenosis. Aorta: The aortic root is normal in size and structure. Venous: The inferior vena cava is normal in size with greater than 50% respiratory variability, suggesting right atrial pressure of 3 mmHg. IAS/Shunts: No atrial level shunt detected by color flow Doppler.  LEFT VENTRICLE PLAX 2D LVIDd:         3.42 cm LVIDs:         2.43 cm LV PW:         1.72 cm LV IVS:         0.80 cm LVOT diam:     2.00 cm LV SV:         79 LV SV Index:   44 LVOT Area:     3.14 cm  RIGHT VENTRICLE RV Basal diam:  2.88 cm RV S prime:     11.60 cm/s TAPSE (M-mode): 3.7 cm LEFT ATRIUM           Index       RIGHT ATRIUM           Index LA diam:      4.60 cm 2.53 cm/m  RA Area:     13.80 cm LA Vol (A4C): 70.0 ml 38.49 ml/m RA Volume:   29.40 ml  16.16 ml/m  AORTIC VALVE                    PULMONIC VALVE AV Area (Vmax):    2.94 cm     PV Vmax:        0.96 m/s AV Area (Vmean):   2.68 cm     PV Peak grad:   3.7 mmHg AV Area (VTI):     2.92 cm     RVOT Peak grad: 3 mmHg AV Vmax:           175.00 cm/s AV Vmean:          116.967 cm/s AV VTI:            0.271 m AV Peak Grad:      12.2 mmHg AV Mean Grad:      6.7 mmHg LVOT Vmax:         164.00 cm/s LVOT Vmean:        99.800 cm/s LVOT VTI:          0.252 m LVOT/AV VTI ratio: 0.93  AORTA Ao Root diam: 3.30 cm MITRAL VALVE                TRICUSPID VALVE MV Area (PHT): 3.89 cm     TR Peak grad:   46.8 mmHg MV Decel Time: 195 msec     TR Vmax:        342.00 cm/s MV E velocity: 128.00 cm/s                             SHUNTS                             Systemic VTI:  0.25 m                             Systemic Diam: 2.00 cm Isaias Cowman MD Electronically signed by Isaias Cowman MD Signature Date/Time: 07/19/2020/4:14:01 PM    Final     Consults: Treatment Team:  Tyler Pita, MD Ottie Glazier, MD   Subjective:    Overnight Issues:  Continues high FiO2 requirement on high flow O2.  Does not tolerate BiPAP.  Overall feels that he is mobilizing secretions better.  Still very short of breath but appears less tachypneic.  Started MetaNeb yesterday.  07/25/20- patient is with family.  He was resting in bed this am NAD.  He is meeting with palliative care and it seems family and patient are leaning towards non-aggressive therapy.  Ive met with ID Dr Ramon Dredge and we reviewed options for Ribavirin but it was explained that due to paucity of  data in nanogenerian population we would offer only supportive care at this time. I agree with palliative and conservative measures with supportive care.   ROS: A 10 point review of systems was performed and it is as noted above otherwise negative. Objective:  Vital signs for last 24 hours: Temp:  [97.8 F (36.6 C)-98.7 F (37.1 C)] 98.7 F (37.1 C) (06/14 2042) Pulse Rate:  [71-103] 71 (06/14 2042) Resp:  [19-24] 24 (06/14 1922) BP: (120-133)/(62-67) 133/64 (06/14 2042) SpO2:  [90 %-99 %] 99 % (06/14 2042) FiO2 (%):  [70 %-100 %] 100 % (06/14 1922)   Intake/Output from previous day: 06/14 0701 - 06/15 0700 In: 240 [P.O.:240] Out: 1000 [Urine:1000]  Intake/Output this  shift: No intake/output data recorded.  Oxygen requirement for last 24 hours: FiO2 (%):  [70 %-100 %] 100 %  Physical Exam:  GENERAL: Elderly gentleman, no conversational dyspnea.  Awake and alert.  On high flow nasal cannula. HEAD: Normocephalic, atraumatic.  EYES: Pupils equal, round, reactive to light.  No scleral icterus.  MOUTH: Oral mucosa moist.  No thrush. NECK: Supple. No thyromegaly. Trachea midline. No JVD.  No adenopathy. PULMONARY: Good air entry bilaterally.  Coarse breath sounds, otherwise no adventitious sounds.  CARDIOVASCULAR: S1 and S2. Regular rate and rhythm.  Grade 2/6 systolic ejection murmur left sternal border. ABDOMEN: Nondistended, normoactive bowel sounds, soft, nontender. MUSCULOSKELETAL: No joint deformity, no clubbing, no edema.  NEUROLOGIC: No overt focal deficit. SKIN: Intact,warm,dry. PSYCH: Anxious, behavior appropriate.  Assessment/Plan:   Acute respiratory failure with hypoxia due to parainfluenza 3 pneumonia Acute lung injury/organizing pneumonia Continue oxygen supplementation to keep oxygen saturations between 88 to 92% Pulmonary hygiene MetaNeb twice daily Continue Brovana via neb twice daily Continue budesonide via neb twice daily Albuterol as  needed Unfortunately no antivirals that can treat parainfluenza 3 Elderly are at risk for more severe disease Prognosis is guarded   Leukocytosis Likely due to steroids Resolving with discontinuation of IV steroids  Acute on chronic kidney injury Avoid nephrotoxins Trend renal panel Continue to monitor Consider holding Lasix    CODE STATUS: DNR     LOS: 10 days   Additional comments: protect  Updated patient and wife at bedside.   Ottie Glazier, M.D.  [facility] [place]    *This note was dictated using voice recognition software/Dragon.  Despite best efforts to proofread, errors can occur which can change the meaning.  Any change was purely unintentional.

## 2020-08-12 DEATH — deceased
